# Patient Record
Sex: Male | Born: 1935
Health system: Southern US, Community
[De-identification: ages and names within clinical notes are randomized; demographics above are authoritative.]

## PROBLEM LIST (undated history)

## (undated) DIAGNOSIS — K219 Gastro-esophageal reflux disease without esophagitis: Secondary | ICD-10-CM

## (undated) DIAGNOSIS — E119 Type 2 diabetes mellitus without complications: Secondary | ICD-10-CM

## (undated) DIAGNOSIS — F0390 Unspecified dementia without behavioral disturbance: Secondary | ICD-10-CM

## (undated) DIAGNOSIS — I639 Cerebral infarction, unspecified: Secondary | ICD-10-CM

## (undated) DIAGNOSIS — I10 Essential (primary) hypertension: Secondary | ICD-10-CM

## (undated) DIAGNOSIS — I2510 Atherosclerotic heart disease of native coronary artery without angina pectoris: Secondary | ICD-10-CM

## (undated) DIAGNOSIS — IMO0001 Reserved for inherently not codable concepts without codable children: Secondary | ICD-10-CM

## (undated) DIAGNOSIS — C801 Malignant (primary) neoplasm, unspecified: Secondary | ICD-10-CM

## (undated) DIAGNOSIS — I509 Heart failure, unspecified: Secondary | ICD-10-CM

## (undated) DIAGNOSIS — H547 Unspecified visual loss: Secondary | ICD-10-CM

## (undated) DIAGNOSIS — N182 Chronic kidney disease, stage 2 (mild): Secondary | ICD-10-CM

## (undated) DIAGNOSIS — E7800 Pure hypercholesterolemia, unspecified: Secondary | ICD-10-CM

## (undated) DIAGNOSIS — M1990 Unspecified osteoarthritis, unspecified site: Secondary | ICD-10-CM

## (undated) DIAGNOSIS — R42 Dizziness and giddiness: Secondary | ICD-10-CM

## (undated) HISTORY — PX: CATARACT EXTRACTION: SUR2

## (undated) HISTORY — PX: CORONARY ANGIOPLASTY WITH STENT PLACEMENT: SHX49

## (undated) HISTORY — DX: Unspecified visual loss: H54.7

---

## 1996-03-04 DIAGNOSIS — C801 Malignant (primary) neoplasm, unspecified: Secondary | ICD-10-CM

## 1996-03-04 HISTORY — DX: Malignant (primary) neoplasm, unspecified: C80.1

## 1996-03-04 HISTORY — PX: PROSTATECTOMY: SHX69

## 1998-01-25 ENCOUNTER — Emergency Department (HOSPITAL_COMMUNITY): Admission: EM | Admit: 1998-01-25 | Discharge: 1998-01-25 | Payer: Self-pay | Admitting: Emergency Medicine

## 1999-08-09 ENCOUNTER — Emergency Department (HOSPITAL_COMMUNITY): Admission: EM | Admit: 1999-08-09 | Discharge: 1999-08-09 | Payer: Self-pay | Admitting: Emergency Medicine

## 1999-08-10 ENCOUNTER — Encounter: Payer: Self-pay | Admitting: Emergency Medicine

## 2000-03-19 ENCOUNTER — Emergency Department (HOSPITAL_COMMUNITY): Admission: EM | Admit: 2000-03-19 | Discharge: 2000-03-19 | Payer: Self-pay | Admitting: Emergency Medicine

## 2000-03-19 ENCOUNTER — Encounter: Payer: Self-pay | Admitting: Emergency Medicine

## 2000-04-01 ENCOUNTER — Encounter: Admission: RE | Admit: 2000-04-01 | Discharge: 2000-04-01 | Payer: Self-pay | Admitting: Family Medicine

## 2000-04-01 ENCOUNTER — Encounter: Payer: Self-pay | Admitting: Family Medicine

## 2001-11-09 ENCOUNTER — Emergency Department (HOSPITAL_COMMUNITY): Admission: EM | Admit: 2001-11-09 | Discharge: 2001-11-09 | Payer: Self-pay | Admitting: Emergency Medicine

## 2001-11-09 ENCOUNTER — Encounter: Payer: Self-pay | Admitting: Emergency Medicine

## 2002-04-23 ENCOUNTER — Encounter: Payer: Self-pay | Admitting: Family Medicine

## 2002-04-23 ENCOUNTER — Encounter: Admission: RE | Admit: 2002-04-23 | Discharge: 2002-04-23 | Payer: Self-pay | Admitting: Family Medicine

## 2003-08-07 ENCOUNTER — Emergency Department (HOSPITAL_COMMUNITY): Admission: EM | Admit: 2003-08-07 | Discharge: 2003-08-07 | Payer: Self-pay | Admitting: Emergency Medicine

## 2004-05-11 ENCOUNTER — Emergency Department (HOSPITAL_COMMUNITY): Admission: EM | Admit: 2004-05-11 | Discharge: 2004-05-11 | Payer: Self-pay

## 2004-08-07 ENCOUNTER — Ambulatory Visit: Payer: Self-pay | Admitting: Internal Medicine

## 2004-08-07 ENCOUNTER — Inpatient Hospital Stay (HOSPITAL_COMMUNITY): Admission: EM | Admit: 2004-08-07 | Discharge: 2004-08-09 | Payer: Self-pay | Admitting: *Deleted

## 2004-08-07 ENCOUNTER — Encounter (INDEPENDENT_AMBULATORY_CARE_PROVIDER_SITE_OTHER): Payer: Self-pay | Admitting: Cardiology

## 2004-08-16 ENCOUNTER — Ambulatory Visit (HOSPITAL_COMMUNITY): Admission: RE | Admit: 2004-08-16 | Discharge: 2004-08-16 | Payer: Self-pay | Admitting: Internal Medicine

## 2004-08-16 ENCOUNTER — Ambulatory Visit: Payer: Self-pay | Admitting: Internal Medicine

## 2004-08-22 ENCOUNTER — Emergency Department (HOSPITAL_COMMUNITY): Admission: EM | Admit: 2004-08-22 | Discharge: 2004-08-23 | Payer: Self-pay | Admitting: Emergency Medicine

## 2004-09-12 ENCOUNTER — Ambulatory Visit: Payer: Self-pay | Admitting: Pulmonary Disease

## 2004-10-19 ENCOUNTER — Ambulatory Visit (HOSPITAL_BASED_OUTPATIENT_CLINIC_OR_DEPARTMENT_OTHER): Admission: RE | Admit: 2004-10-19 | Discharge: 2004-10-19 | Payer: Self-pay | Admitting: Pulmonary Disease

## 2004-10-25 ENCOUNTER — Ambulatory Visit: Payer: Self-pay | Admitting: Pulmonary Disease

## 2004-11-13 ENCOUNTER — Ambulatory Visit: Payer: Self-pay | Admitting: Pulmonary Disease

## 2005-05-03 ENCOUNTER — Ambulatory Visit: Payer: Self-pay | Admitting: Gastroenterology

## 2005-05-13 ENCOUNTER — Ambulatory Visit: Payer: Self-pay | Admitting: Gastroenterology

## 2005-05-13 ENCOUNTER — Encounter (INDEPENDENT_AMBULATORY_CARE_PROVIDER_SITE_OTHER): Payer: Self-pay | Admitting: Specialist

## 2006-03-27 ENCOUNTER — Emergency Department (HOSPITAL_COMMUNITY): Admission: EM | Admit: 2006-03-27 | Discharge: 2006-03-27 | Payer: Self-pay | Admitting: Emergency Medicine

## 2006-05-09 ENCOUNTER — Ambulatory Visit: Payer: Self-pay | Admitting: Gastroenterology

## 2006-05-28 ENCOUNTER — Encounter: Payer: Self-pay | Admitting: Gastroenterology

## 2006-05-28 ENCOUNTER — Ambulatory Visit: Payer: Self-pay | Admitting: Gastroenterology

## 2006-12-19 ENCOUNTER — Ambulatory Visit: Admission: RE | Admit: 2006-12-19 | Discharge: 2007-03-04 | Payer: Self-pay | Admitting: Radiation Oncology

## 2007-03-05 ENCOUNTER — Ambulatory Visit: Admission: RE | Admit: 2007-03-05 | Discharge: 2007-05-08 | Payer: Self-pay | Admitting: Radiation Oncology

## 2007-07-10 ENCOUNTER — Ambulatory Visit: Payer: Self-pay | Admitting: Gastroenterology

## 2007-07-24 ENCOUNTER — Ambulatory Visit: Payer: Self-pay | Admitting: Gastroenterology

## 2007-07-24 ENCOUNTER — Encounter: Payer: Self-pay | Admitting: Gastroenterology

## 2007-07-29 ENCOUNTER — Encounter: Payer: Self-pay | Admitting: Gastroenterology

## 2007-08-08 ENCOUNTER — Emergency Department (HOSPITAL_COMMUNITY): Admission: EM | Admit: 2007-08-08 | Discharge: 2007-08-08 | Payer: Self-pay | Admitting: Emergency Medicine

## 2007-08-18 ENCOUNTER — Encounter: Admission: RE | Admit: 2007-08-18 | Discharge: 2007-08-18 | Payer: Self-pay | Admitting: Family Medicine

## 2008-04-01 ENCOUNTER — Encounter: Admission: RE | Admit: 2008-04-01 | Discharge: 2008-04-01 | Payer: Self-pay | Admitting: Family Medicine

## 2008-04-15 ENCOUNTER — Encounter: Admission: RE | Admit: 2008-04-15 | Discharge: 2008-04-15 | Payer: Self-pay | Admitting: Family Medicine

## 2008-07-22 ENCOUNTER — Emergency Department (HOSPITAL_COMMUNITY): Admission: EM | Admit: 2008-07-22 | Discharge: 2008-07-22 | Payer: Self-pay | Admitting: Emergency Medicine

## 2008-09-23 ENCOUNTER — Ambulatory Visit (HOSPITAL_BASED_OUTPATIENT_CLINIC_OR_DEPARTMENT_OTHER): Admission: RE | Admit: 2008-09-23 | Discharge: 2008-09-23 | Payer: Self-pay | Admitting: Urology

## 2008-09-23 ENCOUNTER — Encounter (INDEPENDENT_AMBULATORY_CARE_PROVIDER_SITE_OTHER): Payer: Self-pay | Admitting: Urology

## 2009-03-10 ENCOUNTER — Encounter: Admission: RE | Admit: 2009-03-10 | Discharge: 2009-03-10 | Payer: Self-pay | Admitting: Family Medicine

## 2010-02-02 ENCOUNTER — Emergency Department (HOSPITAL_COMMUNITY)
Admission: EM | Admit: 2010-02-02 | Discharge: 2010-02-02 | Payer: Self-pay | Source: Home / Self Care | Admitting: Emergency Medicine

## 2010-03-04 HISTORY — PX: OTHER SURGICAL HISTORY: SHX169

## 2010-05-14 LAB — CBC
HCT: 38.5 % — ABNORMAL LOW (ref 39.0–52.0)
MCH: 29.5 pg (ref 26.0–34.0)
MCV: 84.2 fL (ref 78.0–100.0)
WBC: 6 10*3/uL (ref 4.0–10.5)

## 2010-05-14 LAB — DIFFERENTIAL
Eosinophils Absolute: 0.1 10*3/uL (ref 0.0–0.7)
Eosinophils Relative: 2 % (ref 0–5)
Lymphs Abs: 2.6 10*3/uL (ref 0.7–4.0)
Monocytes Absolute: 0.7 10*3/uL (ref 0.1–1.0)
Monocytes Relative: 11 % (ref 3–12)
Neutrophils Relative %: 43 % (ref 43–77)

## 2010-05-14 LAB — BASIC METABOLIC PANEL
BUN: 22 mg/dL (ref 6–23)
Calcium: 9.2 mg/dL (ref 8.4–10.5)
Creatinine, Ser: 1.7 mg/dL — ABNORMAL HIGH (ref 0.4–1.5)
GFR calc non Af Amer: 39 mL/min — ABNORMAL LOW (ref >60)

## 2010-05-14 LAB — POCT CARDIAC MARKERS: Myoglobin, poc: 91.7 ng/mL (ref 12–200)

## 2010-06-10 LAB — POCT I-STAT 4, (NA,K, GLUC, HGB,HCT)
Glucose, Bld: 108 mg/dL — ABNORMAL HIGH (ref 70–99)
Hemoglobin: 15 g/dL (ref 13.0–17.0)
Potassium: 3.9 mEq/L (ref 3.5–5.1)
Sodium: 139 mEq/L (ref 135–145)

## 2010-06-12 LAB — DIFFERENTIAL
Basophils Absolute: 0 K/uL (ref 0.0–0.1)
Basophils Relative: 1 % (ref 0–1)
Eosinophils Absolute: 0.1 K/uL (ref 0.0–0.7)
Eosinophils Relative: 2 % (ref 0–5)
Lymphocytes Relative: 28 % (ref 12–46)
Lymphs Abs: 1.2 K/uL (ref 0.7–4.0)
Monocytes Absolute: 0.6 K/uL (ref 0.1–1.0)
Monocytes Relative: 14 % — ABNORMAL HIGH (ref 3–12)
Neutro Abs: 2.5 K/uL (ref 1.7–7.7)
Neutrophils Relative %: 56 % (ref 43–77)

## 2010-06-12 LAB — BASIC METABOLIC PANEL
BUN: 11 mg/dL (ref 6–23)
Chloride: 106 mEq/L (ref 96–112)
Creatinine, Ser: 1.12 mg/dL (ref 0.4–1.5)

## 2010-06-12 LAB — CBC
Hemoglobin: 12.8 g/dL — ABNORMAL LOW (ref 13.0–17.0)
MCHC: 33.6 g/dL (ref 30.0–36.0)
MCV: 86.7 fL (ref 78.0–100.0)
RBC: 4.38 MIL/uL (ref 4.22–5.81)
WBC: 4.4 10*3/uL (ref 4.0–10.5)

## 2010-07-17 NOTE — Op Note (Signed)
NAMEMATE, ALEGRIA                  ACCOUNT NO.:  [id number]   MEDICAL RECORD NO.:  [id number]          PATIENT TYPE:  AMB   LOCATION:  [facility]                         FACILITY:  [facility]   PHYSICIAN:  Lindaann Slough, M.D.  DATE OF BIRTH:  10-Nov-1929   DATE OF PROCEDURE:  09/23/2008  DATE OF DISCHARGE:                               OPERATIVE REPORT   PREOPERATIVE DIAGNOSIS:  Microhematuria.   POSTOPERATIVE DIAGNOSIS:  Microhematuria.   PROCEDURE DONE:  Cystoscopy and bladder biopsy.   SURGEON:  Dr. Brunilda Payor.   ANESTHESIA:  General.   INDICATION:  The patient is a 77 year old male who was found on  urinalysis to have microhematuria.  He had IMRT for prostate cancer in  2009.  CT scan showed normal kidneys.  Cystoscopy in the office showed a  reddened area on the right side of the bladder.  He is scheduled for  cystoscopy to rule out carcinoma in situ of the bladder.   The patient was identified by his wrist band and proper timeout was  taken.   Under general anesthesia, he was prepped and draped and placed in the  dorsal lithotomy position.  A panendoscope was inserted in the bladder.  The urethra is normal.  He has trilobar prostatic hypertrophy.  The  bladder mucosa is reddened and there is no stone or tumor in the  bladder.  The ureteral orifices are in normal position and shape with  clear efflux.  The bladder is trabeculated.  Then the reddened areas of  the bladder were biopsied with the cold cup forceps.  Then random  bladder biopsy of the bladder was done.  Then areas of biopsy was then  fulgurated with the Bugbee electrode.  There was no evidence of bleeding  at the end of the procedure.  The cystoscope was removed.   He tolerated the procedure well.      Lindaann Slough, M.D.  Electronically Signed     MN/MEDQ  D:  09/23/2008  T:  09/23/2008  Job:  [id number]

## 2010-07-20 NOTE — Procedures (Signed)
NAMEGRAYTON, Trevor Andrews NO.:  [id number]   MEDICAL RECORD NO.:  [id number]          PATIENT TYPE:  OUT   LOCATION:  SLEEP CENTER                 FACILITY:  [facility]   PHYSICIAN:  Marcelyn Bruins, M.D. [facility] DATE OF BIRTH:  [birth year]   DATE OF STUDY:  10/19/2004                              NOCTURNAL POLYSOMNOGRAM   REFERRING PHYSICIAN:  Marcelyn Bruins, MD.   DATE OF STUDY:  October 19, 2004.   INDICATION FOR STUDY:  Hypersomnia with sleep apnea.  Epworth sleepiness  score: 12.   SLEEP ARCHITECTURE:  The patient had a total sleep time of 193 minutes with  very little REM and never achieved slow wave sleep. Sleep onset latency was  normal and REM onset was prolonged. Sleep efficiency was only 52%.   IMPRESSION:  1.  Small numbers of obstructive hypopneas which do not meet the RDI      criteria for the obstructive sleep apnea syndrome.  2.  Mild snoring noted throughout the study with nothing to suggest      increased upper airway resistance.  3.  Occasional PVC.  4.  Large numbers of leg jerks with significant sleep disruption. I suspect      this is the etiology of the patient's sleep disruption and daytime      sleepiness.           ______________________________  Marcelyn Bruins, M.D. [facility]  Diplomate, American Board of Sleep  Medicine     KC/MEDQ  D:  10/24/2004 14:52:00  T:  10/24/2004 22:02:55  Job:  [id number]

## 2010-07-20 NOTE — Discharge Summary (Signed)
NAMEADRIANA, LINA NO.:  [id number]   MEDICAL RECORD NO.:  [id number]          PATIENT TYPE:  INP   LOCATION:  3733                         FACILITY:  [facility]   PHYSICIAN:  Vanetta Mulders, MD         DATE OF BIRTH:  May 11, 1929   DATE OF ADMISSION:  08/07/2004  DATE OF DISCHARGE:  08/09/2004                                 DISCHARGE SUMMARY   DISCHARGE DIAGNOSES:  1.  Hypertension.  2.  Hyperlipidemia.  3.  Bradycardia.  4.  Obstructive sleep apnea with question.  5.  Right bundle branch block on EKG.  Unknown onset.   DISCHARGE MEDICATIONS:  1.  Aspirin 325 mg p.o. daily.  2.  Protonix 40 mg p.o. daily.  3.  Crestor 20 mg p.o. daily.  4.  Nitroglycerin 0.4 mg sublingual every five minutes x3 p.r.n. for chest      pain.   HISTORY OF PRESENT ILLNESS:  Mr. Kosmicki is a 77 year old African-American man  looking a lot younger than his age with no known history of coronary artery  disease.  He presented to the emergency room at [facility] in the  a.m. of admission because of chest tightness on the left side of his chest  with no radiation for a matter of minutes.  He denied any dyspnea or  diaphoresis.  He denied  nausea or vomiting.  He denied history of diabetes  or history of smoking.   ALLERGIES:  Unknown.   PAST MEDICAL HISTORY:  Significant for degenerative spine disease, also  arthritis of the knees, hypercholesterolemia, hypertension.  He has never  smoked, and he drinks alcohol occasionally one time a week.  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 He has Medicare.  Lives with his wife.   FAMILY HISTORY:  Positive for one of the siblings that died 20 years ago  from myocardial infarction at an unknown age, and one sister died of cancer.   PHYSICAL EXAMINATION:  VITAL SIGNS:  Heart rate 58,  blood pressure 175/88,  temperature 97.4, respiratory rate 20, O2 saturation 99% on room air.  GENERAL:  The patient was in no acute distress.  HEENT:  Eyes:  PERRLA.  Extraocular movements intact.  ENT:  Clear.  NECK:  No jugular venous distention.  No thyromegaly.  RESPIRATORY:  Clear to auscultation bilaterally.  CARDIOVASCULAR:  Regular rate and rhythm.  Bradycardia.  No murmurs.  No  rubs.  No gallops.  GI:  Soft, nontender, nondistended.  Bowel sounds positive.  EXTREMITIES:  No edema. Pulses positive.  SKIN:  Warm and dry.  MUSCULOSKELETAL:  Intact.  NEUROLOGIC:  Nonfocal.  PSYCHIATRIC:  Oriented x3.   LABS AT ADMISSION:  Cardiac markers point of care negative x3.  Sodium 138,  potassium 3.6, chloride 107, bicarb 24, BUN 14, creatinine 1.1, glucose 101,  hemoglobin 13.1, white blood cells 5 with an ANC of 2.3, and platelets 229,  bilirubin 0.7, alkaline phosphatase 85, SGOT 17, SGPT 17, protein 6.5,  albumin 3.2, calcium 8.7, lipase 34, D-dimer 0.41.  Chest x-ray portable  showed left lower lobe atelectasis versus infiltrate.  Repeat PA and lateral  show no acute disease.  EKG showed sinus bradycardia with right bundle  branch block and also first-degree AV block and question of inferolateral  ischemia.   PROBLEM:  1.  Chest pain, rule out myocardial infarction.  Risk factors for coronary      artery disease are hyperlipidemia, hypertension, and some remote family      history.  Also, age and gender.  The patient is well fit.  He does not      have obesity, and he has never smoked.  We checked cardiac enzymes q.8h      x3 and they were consistently negative.  We also checked EKGs at several      hour time interval and they were unchanged.  D-dimer was negative.  We      started patient on nitroglycerin drip the second day of admission.  He      was pain free.  The nitroglycerin drip was stopped.  The patient also      was started on aspirin and he continued statins throughout      hospitalization.  He was not a candidate for beta blockers because of      bradycardia.  We consulted cardiology [facility] and we also  ordered a      Cardiolite Persantine which was negative for ischemia, and it showed a      normal ejection fraction of 57%.  We also checked a 2-D echo during this      hospitalization which showed normal ejection fraction, right ventricle      was poorly observed because of poor technique.  There was a question of      septal desynchronism versus right ventricle enlargement and pulmonary      hypertension.  The patient was discharged home.  He is supposed to      follow up with Dr. Frederico Hamman at [facility] and with his primary      care physician for further assessment and plan.  2.  Bradycardia with first-degree block was stable throughout      hospitalization.  The patient was monitored on telemetry.  No events      were noted.  3.  Hyperlipidemia.  We checked a fasting lipid panel which showed a total      cholesterol of 154, LDL 90, HDL 40, triglycerides 119.  The patient was      advised to continue Crestor 20 mg at home as prior to hospitalization.  4.  Gastroesophageal reflux disease with question.  The patient says that      sometimes he has acid reflux.  We started him on Protonix and he was      discharged on the same medication.  5.  Obstructive sleep apnea with question.  The patient complained of      daytime somnolence, fatigue, and lethargy.  He says he cannot go through      an entire day without a daytime nap.  He also complained of superficial      sleep with multiple awakenings through the night, and he also states      that he is heavily snoring according to his wife.  At this point, we      highly consider study for sleep apnea as part of his problems, so we      advised him to follow up with Dr. Shelle Iron at  [facility] on July      12 at 9 a.m.  He would need to have a formal sleep study to confirm our      suspicion.  6.  Prophylaxis.  Throughout hospitalization, the patient was placed on     Lovenox and labs at discharge showed a sodium of 139, potassium  3.7,      chloride 105, bicarb 24, BUN 10, creatinine 1.2, and glucose 102,      hemoglobin 13.5, white blood cell 5.4, and platelets 221.       DA/MEDQ  D:  08/09/2004  T:  08/09/2004  Job:  [id number]

## 2010-11-29 LAB — COMPREHENSIVE METABOLIC PANEL
AST: 21
BUN: 13
CO2: 25
Calcium: 9
Chloride: 102
Creatinine, Ser: 1.2
GFR calc Af Amer: 53 — ABNORMAL LOW
GFR calc non Af Amer: 44 — ABNORMAL LOW
Glucose, Bld: 117 — ABNORMAL HIGH
Total Bilirubin: 0.8

## 2010-11-29 LAB — COMPREHENSIVE METABOLIC PANEL WITH GFR
ALT: 16
Albumin: 3.6
Alkaline Phosphatase: 87
Potassium: 3.5
Sodium: 136
Total Protein: 7.3

## 2010-11-29 LAB — DIFFERENTIAL
Band Neutrophils: 0
Basophils Absolute: 0
Basophils Relative: 0
Blasts: 0
Eosinophils Absolute: 0
Eosinophils Relative: 0
Lymphocytes Relative: 4 — ABNORMAL LOW
Lymphs Abs: 0.4 — ABNORMAL LOW
Metamyelocytes Relative: 0
Monocytes Absolute: 1.1 — ABNORMAL HIGH
Monocytes Relative: 10
Myelocytes: 0
Neutro Abs: 9.2 — ABNORMAL HIGH
Neutrophils Relative %: 86 — ABNORMAL HIGH
Promyelocytes Absolute: 0
nRBC: 0

## 2010-11-29 LAB — URINALYSIS, ROUTINE W REFLEX MICROSCOPIC
Bilirubin Urine: NEGATIVE
Glucose, UA: NEGATIVE
Hgb urine dipstick: NEGATIVE
Specific Gravity, Urine: 1.015
Urobilinogen, UA: 1

## 2010-11-29 LAB — CBC
HCT: 40.3
Hemoglobin: 13.6
MCHC: 33.9
MCV: 87.3
Platelets: 187
RBC: 4.61
RDW: 14.2
WBC: 10.7 — ABNORMAL HIGH

## 2010-11-29 LAB — LIPASE, BLOOD: Lipase: 26

## 2011-01-31 ENCOUNTER — Emergency Department (HOSPITAL_COMMUNITY)
Admission: EM | Admit: 2011-01-31 | Discharge: 2011-01-31 | Disposition: A | Discharge status: Home / Self Care | Payer: Medicare Other | Attending: Emergency Medicine | Admitting: Emergency Medicine

## 2011-01-31 ENCOUNTER — Emergency Department (HOSPITAL_COMMUNITY): Payer: Medicare Other

## 2011-01-31 ENCOUNTER — Encounter: Payer: Self-pay | Admitting: Nurse Practitioner

## 2011-01-31 DIAGNOSIS — R6889 Other general symptoms and signs: Secondary | ICD-10-CM | POA: Insufficient documentation

## 2011-01-31 DIAGNOSIS — J209 Acute bronchitis, unspecified: Secondary | ICD-10-CM | POA: Insufficient documentation

## 2011-01-31 DIAGNOSIS — R110 Nausea: Secondary | ICD-10-CM | POA: Insufficient documentation

## 2011-01-31 DIAGNOSIS — I10 Essential (primary) hypertension: Secondary | ICD-10-CM | POA: Insufficient documentation

## 2011-01-31 DIAGNOSIS — R05 Cough: Secondary | ICD-10-CM | POA: Insufficient documentation

## 2011-01-31 DIAGNOSIS — E785 Hyperlipidemia, unspecified: Secondary | ICD-10-CM | POA: Insufficient documentation

## 2011-01-31 DIAGNOSIS — R059 Cough, unspecified: Secondary | ICD-10-CM | POA: Insufficient documentation

## 2011-01-31 DIAGNOSIS — E7800 Pure hypercholesterolemia, unspecified: Secondary | ICD-10-CM | POA: Insufficient documentation

## 2011-01-31 HISTORY — DX: Unspecified osteoarthritis, unspecified site: M19.90

## 2011-01-31 HISTORY — DX: Essential (primary) hypertension: I10

## 2011-01-31 HISTORY — DX: Pure hypercholesterolemia, unspecified: E78.00

## 2011-01-31 LAB — DIFFERENTIAL
Basophils Relative: 0 % (ref 0–1)
Eosinophils Relative: 2 % (ref 0–5)
Lymphocytes Relative: 11 % — ABNORMAL LOW (ref 12–46)
Monocytes Absolute: 1.2 10*3/uL — ABNORMAL HIGH (ref 0.1–1.0)
Monocytes Relative: 20 % — ABNORMAL HIGH (ref 3–12)
Neutro Abs: 3.9 10*3/uL (ref 1.7–7.7)

## 2011-01-31 LAB — COMPREHENSIVE METABOLIC PANEL
BUN: 17 mg/dL (ref 6–23)
CO2: 30 mEq/L (ref 19–32)
Calcium: 9.2 mg/dL (ref 8.4–10.5)
Chloride: 95 mEq/L — ABNORMAL LOW (ref 96–112)
Creatinine, Ser: 1.47 mg/dL — ABNORMAL HIGH (ref 0.50–1.35)
GFR calc non Af Amer: 44 mL/min — ABNORMAL LOW (ref >90)
Total Bilirubin: 0.4 mg/dL (ref 0.3–1.2)

## 2011-01-31 LAB — TROPONIN I: Troponin I: <0.3 ng/mL (ref <0.30)

## 2011-01-31 LAB — CBC
HCT: 38.1 % — ABNORMAL LOW (ref 39.0–52.0)
Hemoglobin: 12.8 g/dL — ABNORMAL LOW (ref 13.0–17.0)
MCHC: 33.6 g/dL (ref 30.0–36.0)
MCV: 85.4 fL (ref 78.0–100.0)

## 2011-01-31 MED ORDER — ONDANSETRON 4 MG PO TBDP: 4.0000 mg | ORAL_TABLET | Freq: Once | ORAL | Status: DC

## 2011-01-31 MED ORDER — ONDANSETRON HCL 4 MG PO TABS: 4.0000 mg | ORAL_TABLET | Freq: Four times a day (QID) | ORAL | Status: DC

## 2011-01-31 MED ORDER — ONDANSETRON 4 MG PO TBDP
4.0000 mg | ORAL_TABLET | Freq: Once | ORAL | Status: AC
  Administered 2011-01-31: 4 mg via ORAL
  Filled 2011-01-31: qty 1

## 2011-01-31 MED ORDER — AZITHROMYCIN 250 MG PO TABS: 250.0000 mg | ORAL_TABLET | Freq: Every day | ORAL | Status: AC

## 2011-01-31 MED ORDER — AZITHROMYCIN 250 MG PO TABS
500.0000 mg | ORAL_TABLET | Freq: Once | ORAL | Status: AC
  Administered 2011-01-31: 500 mg via ORAL
  Filled 2011-01-31: qty 2

## 2011-01-31 NOTE — ED Notes (Signed)
C/o nasuea, poor appetite, productive cough, scratchy throat x 2 weeks. A&Ox4, resp e/u

## 2011-01-31 NOTE — ED Provider Notes (Signed)
History     CSN: [id number] Arrival date & time: 01/31/2011 12:11 PM   First MD Initiated Contact with Patient 01/31/11 1605      Chief Complaint  Patient presents with  . Nausea    (Consider location/radiation/quality/duration/timing/severity/associated sxs/prior treatment) The history is provided by the patient and the spouse.   patient is a 77 year old male with a history of hypertension hyperlipidemia and remote prostate cancer who presents with 2 weeks of scratchy throat, dry cough, and mild nausea. His throat is generally scratchy but he is able to tolerate by mouth and he does not note lateralization of symptoms. He has not had any productive cough or fevers. He has had generalized nausea which typically improves when he eats something but he has decreased appetite. He has not had any vomiting or diarrhea. No abdominal pain. No dysuria or flank pain. He has overall had a decrease in his activity level, but this has been going on for about one year (no acute change). He has not had any headaches or difficulty ambulating. He does have history of vertigo but this has been well-controlled lately. Overall severity is described as mild to moderate. Patient denies any chest pain or change in the symptoms with exertion.  No recent travel and no known sick contacts.  Past Medical History  Diagnosis Date  . Hypertension   . Hypercholesteremia   . Arthritis     Past Surgical History  Procedure Date  . Prostatectomy     History reviewed. No pertinent family history.  History  Substance Use Topics  . Smoking status: Never Smoker   . Smokeless tobacco: Not on file  . Alcohol Use:      rare      Review of Systems  Constitutional: Negative for fever, chills and activity change.  HENT: Negative for congestion and neck pain.   Respiratory: Negative for cough, chest tightness, shortness of breath and wheezing.   Cardiovascular: Negative for chest pain.  Gastrointestinal: Positive  for nausea. Negative for vomiting, abdominal pain, diarrhea and abdominal distention.  Genitourinary: Negative for difficulty urinating.  Musculoskeletal: Negative for gait problem.  Skin: Negative for rash.  Neurological: Negative for weakness and numbness.  Psychiatric/Behavioral: Negative for behavioral problems and confusion.  All other systems reviewed and are negative.    Allergies  Review of patient's allergies indicates no known allergies.  Home Medications   Current Outpatient Rx  Name Route Sig Dispense Refill  . AMLODIPINE BESYLATE 10 MG PO TABS Oral Take 5 mg by mouth daily.      . CHLORTHALIDONE 25 MG PO TABS Oral Take 12.5 mg by mouth daily.      Marland Kitchen VITAMIN D 2000 UNITS PO CAPS Oral Take 1 capsule by mouth daily.      Marland Kitchen SIMVASTATIN 40 MG PO TABS Oral Take 20 mg by mouth at bedtime.      . TERAZOSIN HCL 5 MG PO CAPS Oral Take 5 mg by mouth at bedtime.      . AZITHROMYCIN 250 MG PO TABS Oral Take 1 tablet (250 mg total) by mouth daily. 4 each 0  . ONDANSETRON HCL 4 MG PO TABS Oral Take 1 tablet (4 mg total) by mouth every 6 (six) hours. 20 tablet 0  . ONDANSETRON 4 MG PO TBDP Oral Take 1 tablet (4 mg total) by mouth once. 20 tablet 0    BP 129/66  Pulse 74  Temp(Src) 99.9 F (37.7 C) (Oral)  Resp 18  Ht 6\' 2"  (  1.88 m)  Wt 230 lb (104.327 kg)  BMI 29.53 kg/m2  SpO2 96%  Physical Exam  Nursing note and vitals reviewed. Constitutional: He is oriented to person, place, and time. He appears well-developed and well-nourished. No distress.  HENT:  Head: Normocephalic.  Nose: Nose normal.  Eyes: EOM are normal. Pupils are equal, round, and reactive to light. No scleral icterus.  Neck: Normal range of motion. Neck supple. No JVD present.  Cardiovascular: Normal rate, regular rhythm and intact distal pulses.   No murmur heard. Pulmonary/Chest: Effort normal and breath sounds normal. No respiratory distress. He exhibits no tenderness.  Abdominal: Soft. Bowel sounds are  normal. He exhibits no distension. There is no tenderness.  Musculoskeletal: Normal range of motion. He exhibits no edema and no tenderness.       No calf ttp No LE edema  Neurological: He is alert and oriented to person, place, and time.       Normal strength  Skin: Skin is warm and dry. No rash noted. He is not diaphoretic.  Psychiatric: He has a normal mood and affect. His behavior is normal. Thought content normal.    ED Course  Procedures (including critical care time)  Labs Reviewed  CBC - Abnormal; Notable for the following:    Hemoglobin 12.8 (*)    HCT 38.1 (*)    All other components within normal limits  DIFFERENTIAL - Abnormal; Notable for the following:    Lymphocytes Relative 11 (*)    Monocytes Relative 20 (*)    Monocytes Absolute 1.2 (*)    All other components within normal limits  COMPREHENSIVE METABOLIC PANEL - Abnormal; Notable for the following:    Potassium 3.4 (*)    Chloride 95 (*)    Creatinine, Ser 1.47 (*)    GFR calc non Af Amer 44 (*)    GFR calc Af Amer 51 (*)    All other components within normal limits  TROPONIN I   Dg Chest 2 View  01/31/2011  *RADIOLOGY REPORT*  Clinical Data: Cough, sore throat  CHEST - 2 VIEW  Comparison: 02/02/2010  Findings: Enlargement of cardiac silhouette. Tortuous aorta. Pulmonary vascularity normal. Minimal bronchitic changes and left basilar atelectasis. Lungs otherwise clear. No pleural effusion or pneumothorax. Multilevel endplate spur formation thoracic spine.  IMPRESSION: Enlargement of cardiac silhouette. Bronchitic changes with left basilar atelectasis.  Original Report Authenticated By: Lollie Marrow, M.D.    Date: 01/31/2011  Rate: 60  Rhythm: normal sinus rhythm  QRS Axis: left  Intervals: normal  ST/T Wave abnormalities: non specific TWI in inferior and precordial leads  Conduction Disutrbances:right bundle branch block  Narrative Interpretation:   Old EKG Reviewed: previous EKG with non specific TWI  and biphasic T waves in inferior and precordial leads;  Mildly different morphology on ecg today.    1. Bronchitis, acute       MDM   Clinical picture is consistent with acute bronchitis. Based on the duration and severity of symptom, will cover with azithromycin.  Chest x-ray is negative for an acute pneumonia. Patient does note additional generalized symptoms. His nausea is mild in nature and not with any associated abdominal pain. He has also been able to keep down food and drink but has decreased appetite. Patient denies chest pain or dyspnea or exertional symptoms. His EKG today shows mild nonspecific T-wave inversions in inferior and precordial leads. These are subtle changes compared to previous EKG. The right bundle branch block is old.  Although  these T-wave inversions are different morphology and nature than previous nonspecific changes, patient has no complaint of chest pain/sob or exertional symptoms.  Symptoms have also been constant for a couple weeks and his troponin is negative. There is no suggestion of acute coronary syndrome.  Provide a copy of EKG with patient and will have him closely followup with his PCP at the [place].  Close return precautions discussed.        Milus Glazier 02/01/11 [phone]

## 2011-01-31 NOTE — ED Notes (Signed)
Old and new EKG given to Dr. Rubin Payor.  Copies placed in chart.

## 2011-01-31 NOTE — ED Notes (Signed)
No signs of distress; ambulated with a steady gait; VSS; reported he will follow d/c instructions.

## 2011-02-02 NOTE — ED Provider Notes (Signed)
I saw and evaluated the patient, reviewed the resident's note and I agree with the findings and plan and agree with their ECG interpretation. Bronchitis with small ECG changes. D/w Cardiology. OK for discharge   Juliet Rude. Rubin Payor, MD 02/02/11 0930

## 2011-02-05 ENCOUNTER — Encounter (HOSPITAL_COMMUNITY): Payer: Self-pay | Admitting: *Deleted

## 2011-02-05 ENCOUNTER — Other Ambulatory Visit: Payer: Self-pay

## 2011-02-05 ENCOUNTER — Emergency Department (HOSPITAL_COMMUNITY)
Admission: EM | Admit: 2011-02-05 | Discharge: 2011-02-06 | Disposition: A | Discharge status: Home / Self Care | Payer: Medicare Other | Attending: Emergency Medicine | Admitting: Emergency Medicine

## 2011-02-05 ENCOUNTER — Emergency Department (HOSPITAL_COMMUNITY): Payer: Medicare Other

## 2011-02-05 DIAGNOSIS — T363X4A Poisoning by macrolides, undetermined, initial encounter: Secondary | ICD-10-CM | POA: Insufficient documentation

## 2011-02-05 DIAGNOSIS — Z79899 Other long term (current) drug therapy: Secondary | ICD-10-CM | POA: Insufficient documentation

## 2011-02-05 DIAGNOSIS — F068 Other specified mental disorders due to known physiological condition: Secondary | ICD-10-CM | POA: Insufficient documentation

## 2011-02-05 DIAGNOSIS — I10 Essential (primary) hypertension: Secondary | ICD-10-CM | POA: Insufficient documentation

## 2011-02-05 DIAGNOSIS — Z09 Encounter for follow-up examination after completed treatment for conditions other than malignant neoplasm: Secondary | ICD-10-CM | POA: Insufficient documentation

## 2011-02-05 DIAGNOSIS — E7800 Pure hypercholesterolemia, unspecified: Secondary | ICD-10-CM | POA: Insufficient documentation

## 2011-02-05 DIAGNOSIS — M129 Arthropathy, unspecified: Secondary | ICD-10-CM | POA: Insufficient documentation

## 2011-02-05 DIAGNOSIS — T3691XA Poisoning by unspecified systemic antibiotic, accidental (unintentional), initial encounter: Secondary | ICD-10-CM | POA: Insufficient documentation

## 2011-02-05 DIAGNOSIS — R350 Frequency of micturition: Secondary | ICD-10-CM | POA: Insufficient documentation

## 2011-02-05 DIAGNOSIS — T50901A Poisoning by unspecified drugs, medicaments and biological substances, accidental (unintentional), initial encounter: Secondary | ICD-10-CM

## 2011-02-05 HISTORY — DX: Unspecified dementia, unspecified severity, without behavioral disturbance, psychotic disturbance, mood disturbance, and anxiety: F03.90

## 2011-02-05 LAB — POCT I-STAT, CHEM 8
BUN: 21 mg/dL (ref 6–23)
Calcium, Ion: 1.13 mmol/L (ref 1.12–1.32)
Chloride: 99 meq/L (ref 96–112)
Creatinine, Ser: 1.8 mg/dL — ABNORMAL HIGH (ref 0.50–1.35)
Glucose, Bld: 90 mg/dL (ref 70–99)
HCT: 39 % (ref 39.0–52.0)
Hemoglobin: 13.3 g/dL (ref 13.0–17.0)
Potassium: 3.6 meq/L (ref 3.5–5.1)
Sodium: 139 meq/L (ref 135–145)
TCO2: 30 mmol/L (ref 0–100)

## 2011-02-05 LAB — URINALYSIS, ROUTINE W REFLEX MICROSCOPIC
Bilirubin Urine: NEGATIVE
Glucose, UA: NEGATIVE mg/dL
Hgb urine dipstick: NEGATIVE
Ketones, ur: NEGATIVE mg/dL
Leukocytes, UA: NEGATIVE
Protein, ur: NEGATIVE mg/dL
pH: 6.5 (ref 5.0–8.0)

## 2011-02-05 LAB — POCT I-STAT TROPONIN I: Troponin i, poc: 0.01 ng/mL (ref 0.00–0.08)

## 2011-02-05 NOTE — ED Notes (Signed)
Called poison control and advised them of pt medication ingestion.

## 2011-02-05 NOTE — ED Notes (Signed)
Poison control states that since pt is feeling fine no further action is needed.  If EDP would like a BUN and Creatnine may be checked

## 2011-02-05 NOTE — ED Provider Notes (Signed)
History     CSN: [id number] Arrival date & time: 02/05/2011  4:58 PM   First MD Initiated Contact with Patient 02/05/11 2003      Chief Complaint  Patient presents with  . Follow-up    (Consider location/radiation/quality/duration/timing/severity/associated sxs/prior treatment) HPI Comments: [name] was seen 5 days ago, diagnosed with bronchitis and given a prescription for azithromycin for a five-day course.  He misunderstood the instructions and took all of the tablets on Friday.  Today he relies his mistake called his physician, Dr. Clyda Greener, who told him to come to the emergency room for evaluation.  He also reports that, since she's run out of his chair.  His 075 mg, tablets he's been trying to take 22 mg tablets, but finds that he is urinating more frequently and is concerned that he may have a urinary tract infection, although he has no pain.  Has had no fever.  He states he has reordered the 5 mg dose from the [place] and is awaiting delivery.  As far as his bronchitis, says he's feeling much better, denies cough, fever, chest pain, shortness of breath.  Will contact poison control for by son treatment if needed of accidental overdose of azithromycin 5 days ago.  The history is provided by the patient.    Past Medical History  Diagnosis Date  . Hypertension   . Hypercholesteremia   . Arthritis   . Dementia     Past Surgical History  Procedure Date  . Prostatectomy     History reviewed. No pertinent family history.  History  Substance Use Topics  . Smoking status: Never Smoker   . Smokeless tobacco: Not on file  . Alcohol Use: Yes     rare      Review of Systems  Constitutional: Negative for activity change.  HENT: Negative.   Eyes: Negative.   Respiratory: Negative for cough and shortness of breath.   Cardiovascular: Negative for chest pain.  Gastrointestinal: Negative for nausea and vomiting.  Genitourinary: Positive for frequency. Negative for dysuria.    Musculoskeletal: Negative.   Neurological: Negative.   Hematological: Negative.   Psychiatric/Behavioral: Negative.     Allergies  Review of patient's allergies indicates no known allergies.  Home Medications   Current Outpatient Rx  Name Route Sig Dispense Refill  . AMLODIPINE BESYLATE 10 MG PO TABS Oral Take 5 mg by mouth daily.      . CHLORTHALIDONE 25 MG PO TABS Oral Take 12.5 mg by mouth daily.      Marland Kitchen VITAMIN D 2000 UNITS PO CAPS Oral Take 1 capsule by mouth daily.      Marland Kitchen ONDANSETRON HCL 4 MG PO TABS Oral Take 4 mg by mouth every 6 (six) hours as needed. For nausea     . ONDANSETRON 4 MG PO TBDP Oral Take 4 mg by mouth daily as needed. For nausea     . SIMVASTATIN 40 MG PO TABS Oral Take 20 mg by mouth at bedtime.      . TERAZOSIN HCL 5 MG PO CAPS Oral Take 5 mg by mouth at bedtime.        BP 142/72  Pulse 56  Temp(Src) 97.9 F (36.6 C) (Oral)  Resp 20  SpO2 95%  Physical Exam  Constitutional: He is oriented to person, place, and time. He appears well-developed and well-nourished.  HENT:  Head: Normocephalic.  Eyes: Pupils are equal, round, and reactive to light.  Neck: Neck supple.  Cardiovascular: Normal rate and  regular rhythm.   Pulmonary/Chest: No respiratory distress. He has no rales.  Abdominal: Soft.  Musculoskeletal: Normal range of motion.  Neurological: He is alert and oriented to person, place, and time.  Skin: Skin is warm and dry.    ED Course  Procedures (including critical care time)  Labs Reviewed  POCT I-STAT, CHEM 8 - Abnormal; Notable for the following:    Creatinine, Ser 1.80 (*)    All other components within normal limits  URINALYSIS, ROUTINE W REFLEX MICROSCOPIC  POCT I-STAT TROPONIN I  I-STAT TROPONIN I  I-STAT, CHEM 8   Dg Chest 2 View  02/05/2011  *RADIOLOGY REPORT*  Clinical Data: Shortness of breath.  Acute bronchitis.  CHEST - 2 VIEW 02/05/2011:  Comparison: Two-view chest x-ray 01/31/2011 [facility], 02/02/2010  [facility], 03/10/2009 and 04/15/2008 [facility].  Findings: Suboptimal inspiration.  Stable mild chronic elevation of the left hemidiaphragm.  Cardiac silhouette mildly enlarged but stable.  Lungs clear.  Bronchovascular markings normal, improved since examination 6 days ago.  No pleural effusions.  Mild degenerative changes involving the thoracic spine.  IMPRESSION: Suboptimal inspiration.  No acute cardiopulmonary disease.  Original Report Authenticated By: Arnell Sieving, M.D.     1. Accidental drug overdose     ED ECG REPORT   Date: 02/06/2011  EKG Time: 12:20 AM  Rate: 49  Rhythm: sinus bradycardia,  normal EKG, normal sinus rhythm, unchanged from previous tracings, RBBB  Axis: normal  Intervals:none  ST&T Change LVH  Narrative Interpretation: unchanged from 01/31/11     Poison control recommends a BUN and Creatinine to evaluate kidney function        MDM  Accidental drug overdose        Arman Filter, NP 02/06/11 0008  Arman Filter, NP 02/06/11 0020

## 2011-02-05 NOTE — Progress Notes (Signed)
77 year old male has had a cough for the last week. He received a prescription for Zithromax 4 days ago and took all the medication in 1 day instead of taking it over 5 days. He states that he still has some cough although it has improved. He has noted some wheezing. On exam, there are faint wheezes noted with forced exhalation. No rales and no rhonchi are heard. He is resting comfortably in no acute distress. Chest x-ray shows no infiltrates. He will be treated with albuterol.

## 2011-02-05 NOTE — ED Notes (Signed)
Pt was seen here last Thursday and dx with bronchitis and was given rx, states that he has been taking his medications wrong. Reports mild cough at night. Denies fevers. Pt reports he is feeling pretty good.

## 2011-02-05 NOTE — ED Notes (Signed)
Patient transported to X-ray and is now back

## 2011-02-06 MED ORDER — ALBUTEROL SULFATE HFA 108 (90 BASE) MCG/ACT IN AERS
2.0000 | INHALATION_SPRAY | RESPIRATORY_TRACT | Status: DC | PRN
  Administered 2011-02-06: 2 via RESPIRATORY_TRACT
  Filled 2011-02-06: qty 6.7

## 2011-02-06 NOTE — ED Provider Notes (Signed)
Medical screening examination/treatment/procedure(s) were conducted as a shared visit with non-physician practitioner(s) and myself.  I personally evaluated the patient during the encounter   Kloe Oates A. Patrica Duel, MD 02/06/11 1347

## 2011-02-06 NOTE — ED Notes (Signed)
Wife and pt requesting to speak with NP about "pump" that pt was suppose to get and about results of test.  NP notified of same.

## 2011-04-12 ENCOUNTER — Encounter: Payer: Self-pay | Admitting: Gastroenterology

## 2011-04-24 ENCOUNTER — Ambulatory Visit (AMBULATORY_SURGERY_CENTER): Payer: Medicare Other | Admitting: *Deleted

## 2011-04-24 ENCOUNTER — Telehealth: Payer: Self-pay | Admitting: *Deleted

## 2011-04-24 VITALS — Ht 74.0 in | Wt 231.4 lb

## 2011-04-24 DIAGNOSIS — Z1211 Encounter for screening for malignant neoplasm of colon: Secondary | ICD-10-CM

## 2011-04-24 MED ORDER — MOVIPREP 100 G PO SOLR: ORAL | Status: DC

## 2011-04-24 NOTE — Telephone Encounter (Signed)
Pt. Is 78 years old and was here for a pre visit today do to receiving a recall letter.He is not having any gi problems and in the chart on your   review on 07/26/10 you indicated no follow-up colonoscopy.Please review the chart as I have placed it on your desk and let us know if scheduled procedure should be cancelled.

## 2011-04-24 NOTE — Telephone Encounter (Signed)
NOT NEEDED

## 2011-04-26 NOTE — Telephone Encounter (Signed)
SPOKE WITH PATIENT EXPLAINED THAT DR.PATTERSON REVIEWED HIS CHART AND STATES HE DID NOT NEED COLONOSCOPY AT THIS TIME. EXPLAINED TO PATIENT IF HE HAD ANY NEW OR GI CONCERNS TO PLEASE CALL us BACK. HE UNDERSTANDS. APPOINTMENT CANCELLED.

## 2011-05-08 ENCOUNTER — Encounter: Payer: Medicare Other | Admitting: Gastroenterology

## 2011-05-13 ENCOUNTER — Encounter: Payer: Medicare Other | Admitting: Gastroenterology

## 2013-09-03 ENCOUNTER — Encounter (HOSPITAL_COMMUNITY): Payer: Self-pay | Admitting: Emergency Medicine

## 2013-09-03 ENCOUNTER — Emergency Department (HOSPITAL_COMMUNITY): Payer: Non-veteran care

## 2013-09-03 ENCOUNTER — Emergency Department (HOSPITAL_COMMUNITY)
Admission: EM | Admit: 2013-09-03 | Discharge: 2013-09-03 | Disposition: A | Discharge status: Home / Self Care | Payer: Non-veteran care | Attending: Emergency Medicine | Admitting: Emergency Medicine

## 2013-09-03 DIAGNOSIS — E7800 Pure hypercholesterolemia, unspecified: Secondary | ICD-10-CM | POA: Insufficient documentation

## 2013-09-03 DIAGNOSIS — I10 Essential (primary) hypertension: Secondary | ICD-10-CM | POA: Insufficient documentation

## 2013-09-03 DIAGNOSIS — IMO0002 Reserved for concepts with insufficient information to code with codable children: Secondary | ICD-10-CM | POA: Insufficient documentation

## 2013-09-03 DIAGNOSIS — M129 Arthropathy, unspecified: Secondary | ICD-10-CM | POA: Insufficient documentation

## 2013-09-03 DIAGNOSIS — R0602 Shortness of breath: Secondary | ICD-10-CM

## 2013-09-03 DIAGNOSIS — E119 Type 2 diabetes mellitus without complications: Secondary | ICD-10-CM | POA: Insufficient documentation

## 2013-09-03 DIAGNOSIS — Z7982 Long term (current) use of aspirin: Secondary | ICD-10-CM | POA: Insufficient documentation

## 2013-09-03 DIAGNOSIS — Z79899 Other long term (current) drug therapy: Secondary | ICD-10-CM | POA: Insufficient documentation

## 2013-09-03 DIAGNOSIS — Z859 Personal history of malignant neoplasm, unspecified: Secondary | ICD-10-CM | POA: Insufficient documentation

## 2013-09-03 DIAGNOSIS — F0390 Unspecified dementia without behavioral disturbance: Secondary | ICD-10-CM | POA: Insufficient documentation

## 2013-09-03 HISTORY — DX: Malignant (primary) neoplasm, unspecified: C80.1

## 2013-09-03 LAB — BASIC METABOLIC PANEL
ANION GAP: 13 (ref 5–15)
BUN: 15 mg/dL (ref 6–23)
CHLORIDE: 106 meq/L (ref 96–112)
CO2: 23 mEq/L (ref 19–32)
CREATININE: 1.22 mg/dL (ref 0.50–1.35)
Calcium: 8.8 mg/dL (ref 8.4–10.5)
GFR, EST AFRICAN AMERICAN: 62 mL/min — AB (ref >90)
GFR, EST NON AFRICAN AMERICAN: 54 mL/min — AB (ref >90)
Glucose, Bld: 106 mg/dL — ABNORMAL HIGH (ref 70–99)
Potassium: 4.1 mEq/L (ref 3.7–5.3)
Sodium: 142 mEq/L (ref 137–147)

## 2013-09-03 LAB — CBC
HCT: 37.9 % — ABNORMAL LOW (ref 39.0–52.0)
Hemoglobin: 12.8 g/dL — ABNORMAL LOW (ref 13.0–17.0)
MCH: 28.7 pg (ref 26.0–34.0)
MCHC: 33.8 g/dL (ref 30.0–36.0)
MCV: 85 fL (ref 78.0–100.0)
PLATELETS: 190 10*3/uL (ref 150–400)
RBC: 4.46 MIL/uL (ref 4.22–5.81)
RDW: 15.2 % (ref 11.5–15.5)
WBC: 4.3 10*3/uL (ref 4.0–10.5)

## 2013-09-03 LAB — I-STAT TROPONIN, ED: Troponin i, poc: 0.01 ng/mL (ref 0.00–0.08)

## 2013-09-03 LAB — PRO B NATRIURETIC PEPTIDE: Pro B Natriuretic peptide (BNP): 66.7 pg/mL (ref 0–450)

## 2013-09-03 LAB — D-DIMER, QUANTITATIVE: D-Dimer, Quant: 1.01 ug/mL-FEU — ABNORMAL HIGH (ref 0.00–0.48)

## 2013-09-03 MED ORDER — IOHEXOL 350 MG/ML SOLN
100.0000 mL | Freq: Once | INTRAVENOUS | Status: AC | PRN
  Administered 2013-09-03: 100 mL via INTRAVENOUS

## 2013-09-03 MED ORDER — IPRATROPIUM-ALBUTEROL 0.5-2.5 (3) MG/3ML IN SOLN
3.0000 mL | Freq: Once | RESPIRATORY_TRACT | Status: AC
  Administered 2013-09-03: 3 mL via RESPIRATORY_TRACT
  Filled 2013-09-03: qty 3

## 2013-09-03 NOTE — ED Notes (Signed)
Pt reports intermittent SOB for several days. Pt reports he received Zoledronic Acid IV on Monday, SOB has seemed to get worse since then. Pt does not appear to be in acute distress at this time. Ax4.

## 2013-09-03 NOTE — ED Provider Notes (Signed)
80 year old male comes in with a several day history of vague dyspnea. He states he feels like he is getting his breath away he should. Dyspnea is not worse with exertion and worse with change in position. There is no associated chest pain. He occasionally has a mild cough. No fever or chills. On exam, there is no neck vein distention. Lungs are clear but there is suggestion of a slightly prolonged exhalation phase. Heart has regular rate and rhythm. Extremities no cyanosis or edema. Initial laboratory workup is negative and ECG is unchanged from baseline. Chest x-ray will be obtained and will be given a therapeutic trial of albuterol with ipratropium. D-dimer will be checked.  I saw and evaluated the patient, reviewed the resident's note and I agree with the findings and plan.   EKG Interpretation   Date/Time:  Friday September 03 2013 17:15:51 EDT Ventricular Rate:  58 PR Interval:  248 QRS Duration: 134 QT Interval:  454 QTC Calculation: 445 R Axis:   -41 Text Interpretation:  Sinus bradycardia with 1st degree A-V block Left  axis deviation Right bundle branch block Minimal voltage criteria for LVH,  may be normal variant Abnormal ECG When compared with ECG of 02/05/2011, No  significant change was found Confirmed by [facility]  MD, Karmelo Bass ([id number]) on  09/03/2013 6:58:33 PM        Delora Fuel, MD 32/44/01 0272

## 2013-09-03 NOTE — Discharge Instructions (Signed)
Shortness of Breath °Shortness of breath means you have trouble breathing. Shortness of breath needs medical care right away. °HOME CARE  °· Do not smoke. °· Avoid being around chemicals or things (paint fumes, dust) that may bother your breathing. °· Rest as needed. Slowly begin your normal activities. °· Only take medicines as told by your doctor. °· Keep all doctor visits as told. °GET HELP RIGHT AWAY IF:  °· Your shortness of breath gets worse. °· You feel lightheaded, pass out (faint), or have a cough that is not helped by medicine. °· You cough up blood. °· You have pain with breathing. °· You have pain in your chest, arms, shoulders, or belly (abdomen). °· You have a fever. °· You cannot walk up stairs or exercise the way you normally do. °· You do not get better in the time expected. °· You have a hard time doing normal activities even with rest. °· You have problems with your medicines. °· You have any new symptoms. °MAKE SURE YOU: °· Understand these instructions. °· Will watch your condition. °· Will get help right away if you are not doing well or get worse. °Document Released: 08/07/2007 Document Revised: 02/23/2013 Document Reviewed: 05/06/2011 °Exit[employer]® Patient Information ©2015 [employer]. This information is not intended to replace advice given to you by your health care provider. Make sure you discuss any questions you have with your health care provider. ° °

## 2013-09-04 NOTE — ED Provider Notes (Signed)
CSN: [id number]     Arrival date & time 09/03/13  1706 History   First MD Initiated Contact with Patient 09/03/13 1821     Chief Complaint  Patient presents with  . Shortness of Breath  . Dizziness   80 y/o male with PMH of DM and HTN that presents with reported worsening SOB. Patient states that he has had worsening symptoms over the last few weeks and was prompted to come in today because "he wanted to get checked out and couldn't get into his regular doctor" He reports no chest pain, diaphoresis, edema, cough/congestion with the SOB. He is further feeling more symptoms with simple activities but is on exam in no acute distress and able to talk in full sentences with out difficulty.    (Consider location/radiation/quality/duration/timing/severity/associated sxs/prior Treatment) Patient is a 80 y.o. male presenting with shortness of breath.  Shortness of Breath Severity:  Mild Onset quality:  Gradual Duration:  3 weeks Chronicity:  New Context: activity   Associated symptoms: no abdominal pain, no chest pain, no cough and no headaches     Past Medical History  Diagnosis Date  . Hypertension   . Hypercholesteremia   . Arthritis   . Dementia   . Diabetes mellitus without complication   . Cancer    Past Surgical History  Procedure Laterality Date  . Prostatectomy    . Cataract extraction  2011 and 2012    2011lt eye and rt eye 2012  . Glauma implant  2012    rt.eye   No family history on file. History  Substance Use Topics  . Smoking status: Never Smoker   . Smokeless tobacco: Never Used  . Alcohol Use: 1.2 oz/week    2 Shots of liquor per week     Comment: rare    Review of Systems  Constitutional: Negative for activity change.  HENT: Negative for congestion.   Eyes: Negative for visual disturbance.  Respiratory: Positive for shortness of breath. Negative for cough.   Cardiovascular: Negative for chest pain and leg swelling.  Gastrointestinal: Negative for  abdominal pain and blood in stool.  Genitourinary: Negative for dysuria and hematuria.  Musculoskeletal: Negative for back pain.  Skin: Negative for color change.  Neurological: Negative for syncope and headaches.  Psychiatric/Behavioral: Negative for agitation.      Allergies  Review of patient's allergies indicates no known allergies.  Home Medications   Prior to Admission medications   Medication Sig Start Date End Date Taking? Authorizing Provider  acetaminophen (TYLENOL) 500 MG tablet Take 1,000 mg by mouth 2 (two) times daily.   Yes Historical Provider, MD  allopurinol (ZYLOPRIM) 100 MG tablet Take 100 mg by mouth daily.   Yes Historical Provider, MD  amLODipine (NORVASC) 10 MG tablet Take 10 mg by mouth daily.    Yes Historical Provider, MD  aspirin EC 81 MG tablet Take 81 mg by mouth daily.   Yes Historical Provider, MD  atorvastatin (LIPITOR) 40 MG tablet Take 20 mg by mouth at bedtime.   Yes Historical Provider, MD  Calcium Carbonate-Vitamin D (CALCIUM 600+D) 600-400 MG-UNIT per tablet Take 1 tablet by mouth 2 (two) times daily.   Yes Historical Provider, MD  cholecalciferol (VITAMIN D) 1000 UNITS tablet Take 1,000 Units by mouth daily.   Yes Historical Provider, MD  dorzolamide-timolol (COSOPT) 22.3-6.8 MG/ML ophthalmic solution Place 2 drops into both eyes Twice daily. 04/21/11  Yes Historical Provider, MD  ketorolac (ACULAR) 0.5 % ophthalmic solution Place 1 drop into  the left eye 2 (two) times daily.    Yes Historical Provider, MD  polyvinyl alcohol (LIQUIFILM TEARS) 1.4 % ophthalmic solution Place 1 drop into both eyes 3 (three) times daily as needed for dry eyes.   Yes Historical Provider, MD  prednisoLONE acetate (PRED FORTE) 1 % ophthalmic suspension Place 1 drop into both eyes 2 times daily. 04/12/11  Yes Historical Provider, MD  PRESCRIPTION MEDICATION Take 3 tablets by mouth daily as needed (pain). Arthritis pain medication   Yes Historical Provider, MD  tamsulosin  (FLOMAX) 0.4 MG CAPS capsule Take 0.4 mg by mouth at bedtime.   Yes Historical Provider, MD   BP 137/75  Pulse 53  Temp(Src) 98 F (36.7 C) (Oral)  Resp 21  SpO2 97% Physical Exam  Nursing note and vitals reviewed. Constitutional: He is oriented to person, place, and time. He appears well-developed and well-nourished.  HENT:  Head: Normocephalic.  Eyes: Pupils are equal, round, and reactive to light.  Neck: Neck supple. No JVD present. No tracheal deviation present.  Cardiovascular: Normal rate and regular rhythm.  Exam reveals no gallop and no friction rub.   No murmur heard. Pulmonary/Chest: Effort normal. No respiratory distress. He has no wheezes. He has no rales.  Abdominal: Soft. He exhibits no distension. There is no tenderness.  Musculoskeletal: He exhibits no edema.  Neurological: He is alert and oriented to person, place, and time.  Skin: Skin is warm.  Psychiatric: He has a normal mood and affect.    ED Course  Procedures (including critical care time) Labs Review Labs Reviewed  CBC - Abnormal; Notable for the following:    Hemoglobin 12.8 (*)    HCT 37.9 (*)    All other components within normal limits  BASIC METABOLIC PANEL - Abnormal; Notable for the following:    Glucose, Bld 106 (*)    GFR calc non Af Amer 54 (*)    GFR calc Af Amer 62 (*)    All other components within normal limits  D-DIMER, QUANTITATIVE - Abnormal; Notable for the following:    D-Dimer, Quant 1.01 (*)    All other components within normal limits  PRO B NATRIURETIC PEPTIDE  I-STAT TROPOININ, ED    Imaging Review Ct Angio Chest W/cm &/or Wo Cm  09/03/2013   CLINICAL DATA:  Intermittent shortness of breath for several days.  EXAM: CT ANGIOGRAPHY CHEST WITH CONTRAST  TECHNIQUE: Multidetector CT imaging of the chest was performed using the standard protocol during bolus administration of intravenous contrast. Multiplanar CT image reconstructions and MIPs were obtained to evaluate the  vascular anatomy.  CONTRAST:  180mL OMNIPAQUE IOHEXOL 350 MG/ML SOLN  COMPARISON:  Portable chest x-ray of today's date and PA and lateral chest x-ray of February 05, 2011  FINDINGS: Contrast within the pulmonary arterial tree is normal. There are no filling defects to suggest an acute pulmonary embolism. The caliber of the thoracic aorta is normal. The cardiac chambers are normal. There are coronary artery calcifications. There is no mediastinal nor hilar lymphadenopathy. There is no pleural nor pericardial effusion.  At lung window settings there is minimal compressive atelectasis posteriorly. There is no alveolar pneumonia. There are no pulmonary parenchymal nodules or masses.  Within the upper abdomen the observed portions of the liver, spleen, and adrenal glands are normal. The thoracic spine, sternum, and visualized ribs exhibit no acute abnormalities. There are mild degenerative changes of the thoracic spine.  Review of the MIP images confirms the above findings.  IMPRESSION:  1. There is no acute pulmonary embolism nor other acute cardiopulmonary abnormality. There is coronary atherosclerosis. 2. There is no lymphadenopathy nor pleural effusion or pneumothorax.   Electronically Signed   By: David  Martinique   On: 09/03/2013 21:12   Dg Chest Port 1 View  09/03/2013   CLINICAL DATA:  Shortness of breath and hypertension.  EXAM: PORTABLE CHEST - 1 VIEW  COMPARISON:  PA and lateral chest of February 05, 2011  FINDINGS: The lungs are adequately inflated and clear. The heart is top-normal in size. The pulmonary vascularity is not engorged. There is no pleural effusion. The bony thorax is unremarkable.  IMPRESSION: There is no acute cardiopulmonary disease.   Electronically Signed   By: David  Martinique   On: 09/03/2013 19:26     EKG Interpretation   Date/Time:  Friday September 03 2013 17:15:51 EDT Ventricular Rate:  58 PR Interval:  248 QRS Duration: 134 QT Interval:  454 QTC Calculation: 445 R Axis:   -41 Text  Interpretation:  Sinus bradycardia with 1st degree A-V block Left  axis deviation Right bundle branch block Minimal voltage criteria for LVH,  may be normal variant Abnormal ECG When compared with ECG of 02/05/2011, No  significant change was found Confirmed by [facility]  MD, DAVID ([id number]) on  09/03/2013 6:58:33 PM      MDM   Final diagnoses:  SOB (shortness of breath)   80 y/o male presents with vague dyspnea that he reports has been presents for the past few weeks. He has no other associated symptoms and reports some worsening with activities, such as singing. The patient on physical exam doe not have JVD, pitting edema, or rhonchi on lung exam. Heart is RRR with no murmurs.   Patient evaluated with EKG, CXR, BNP, CBC, BMP, trop and D dimer.   The D-dimer did come back positive and was followed-up with a CT angio that confirmed that a PE was not present.  Patient had sinus bradycardia on EKG which was determined to be a normal variant. Otherwise the patient had a negative work-up. Patient was informed of diagnostic findings and was discharged home with instructions to follow-up with PCP if symptoms continue.     Claudean Severance, MD 09/04/13 1240

## 2014-03-04 DIAGNOSIS — I639 Cerebral infarction, unspecified: Secondary | ICD-10-CM

## 2014-03-04 HISTORY — DX: Cerebral infarction, unspecified: I63.9

## 2014-09-15 ENCOUNTER — Encounter (INDEPENDENT_AMBULATORY_CARE_PROVIDER_SITE_OTHER): Payer: Medicare Other | Admitting: Ophthalmology

## 2014-09-15 DIAGNOSIS — H34811 Central retinal vein occlusion, right eye: Secondary | ICD-10-CM | POA: Diagnosis not present

## 2014-09-15 DIAGNOSIS — H35033 Hypertensive retinopathy, bilateral: Secondary | ICD-10-CM | POA: Diagnosis not present

## 2014-09-15 DIAGNOSIS — H43813 Vitreous degeneration, bilateral: Secondary | ICD-10-CM

## 2014-09-15 DIAGNOSIS — I10 Essential (primary) hypertension: Secondary | ICD-10-CM | POA: Diagnosis not present

## 2014-10-10 ENCOUNTER — Encounter (INDEPENDENT_AMBULATORY_CARE_PROVIDER_SITE_OTHER): Payer: Medicare Other | Admitting: Ophthalmology

## 2014-10-10 DIAGNOSIS — H35033 Hypertensive retinopathy, bilateral: Secondary | ICD-10-CM

## 2014-10-10 DIAGNOSIS — I10 Essential (primary) hypertension: Secondary | ICD-10-CM | POA: Diagnosis not present

## 2014-10-10 DIAGNOSIS — H43813 Vitreous degeneration, bilateral: Secondary | ICD-10-CM | POA: Diagnosis not present

## 2014-10-10 DIAGNOSIS — H34811 Central retinal vein occlusion, right eye: Secondary | ICD-10-CM

## 2014-10-14 ENCOUNTER — Encounter (HOSPITAL_COMMUNITY): Payer: Self-pay

## 2014-10-14 ENCOUNTER — Emergency Department (HOSPITAL_COMMUNITY)
Admission: EM | Admit: 2014-10-14 | Discharge: 2014-10-14 | Disposition: A | Discharge status: Home / Self Care | Payer: Medicare Other | Attending: Emergency Medicine | Admitting: Emergency Medicine

## 2014-10-14 ENCOUNTER — Emergency Department (HOSPITAL_COMMUNITY): Payer: Medicare Other

## 2014-10-14 DIAGNOSIS — E876 Hypokalemia: Secondary | ICD-10-CM | POA: Diagnosis not present

## 2014-10-14 DIAGNOSIS — Z9861 Coronary angioplasty status: Secondary | ICD-10-CM | POA: Diagnosis not present

## 2014-10-14 DIAGNOSIS — R531 Weakness: Secondary | ICD-10-CM

## 2014-10-14 DIAGNOSIS — F0390 Unspecified dementia without behavioral disturbance: Secondary | ICD-10-CM | POA: Diagnosis not present

## 2014-10-14 DIAGNOSIS — Z79899 Other long term (current) drug therapy: Secondary | ICD-10-CM | POA: Insufficient documentation

## 2014-10-14 DIAGNOSIS — I10 Essential (primary) hypertension: Secondary | ICD-10-CM | POA: Diagnosis not present

## 2014-10-14 DIAGNOSIS — Z7982 Long term (current) use of aspirin: Secondary | ICD-10-CM | POA: Diagnosis not present

## 2014-10-14 DIAGNOSIS — E780 Pure hypercholesterolemia: Secondary | ICD-10-CM | POA: Diagnosis not present

## 2014-10-14 DIAGNOSIS — M1990 Unspecified osteoarthritis, unspecified site: Secondary | ICD-10-CM | POA: Insufficient documentation

## 2014-10-14 DIAGNOSIS — E119 Type 2 diabetes mellitus without complications: Secondary | ICD-10-CM | POA: Diagnosis not present

## 2014-10-14 DIAGNOSIS — Z859 Personal history of malignant neoplasm, unspecified: Secondary | ICD-10-CM | POA: Diagnosis not present

## 2014-10-14 DIAGNOSIS — R0602 Shortness of breath: Secondary | ICD-10-CM

## 2014-10-14 LAB — URINALYSIS, ROUTINE W REFLEX MICROSCOPIC
Bilirubin Urine: NEGATIVE
Glucose, UA: NEGATIVE mg/dL
Ketones, ur: NEGATIVE mg/dL
Leukocytes, UA: NEGATIVE
NITRITE: NEGATIVE
Protein, ur: NEGATIVE mg/dL
SPECIFIC GRAVITY, URINE: 1.015 (ref 1.005–1.030)
UROBILINOGEN UA: 0.2 mg/dL (ref 0.0–1.0)
pH: 5.5 (ref 5.0–8.0)

## 2014-10-14 LAB — CBC WITH DIFFERENTIAL/PLATELET
Basophils Absolute: 0 10*3/uL (ref 0.0–0.1)
Basophils Relative: 0 % (ref 0–1)
EOS ABS: 0.1 10*3/uL (ref 0.0–0.7)
Eosinophils Relative: 1 % (ref 0–5)
HCT: 41.1 % (ref 39.0–52.0)
HEMOGLOBIN: 13.9 g/dL (ref 13.0–17.0)
Lymphocytes Relative: 24 % (ref 12–46)
Lymphs Abs: 1.4 10*3/uL (ref 0.7–4.0)
MCH: 28.8 pg (ref 26.0–34.0)
MCHC: 33.8 g/dL (ref 30.0–36.0)
MCV: 85.1 fL (ref 78.0–100.0)
Monocytes Absolute: 0.5 10*3/uL (ref 0.1–1.0)
Monocytes Relative: 9 % (ref 3–12)
NEUTROS ABS: 3.7 10*3/uL (ref 1.7–7.7)
NEUTROS PCT: 66 % (ref 43–77)
PLATELETS: 201 10*3/uL (ref 150–400)
RBC: 4.83 MIL/uL (ref 4.22–5.81)
RDW: 14.3 % (ref 11.5–15.5)
WBC: 5.6 10*3/uL (ref 4.0–10.5)

## 2014-10-14 LAB — COMPREHENSIVE METABOLIC PANEL
ALBUMIN: 3.9 g/dL (ref 3.5–5.0)
ALT: 15 U/L — ABNORMAL LOW (ref 17–63)
AST: 18 U/L (ref 15–41)
Alkaline Phosphatase: 62 U/L (ref 38–126)
Anion gap: 10 (ref 5–15)
BUN: 10 mg/dL (ref 6–20)
CALCIUM: 9.5 mg/dL (ref 8.9–10.3)
CO2: 23 mmol/L (ref 22–32)
CREATININE: 1.09 mg/dL (ref 0.61–1.24)
Chloride: 104 mmol/L (ref 101–111)
Glucose, Bld: 154 mg/dL — ABNORMAL HIGH (ref 65–99)
Potassium: 3 mmol/L — ABNORMAL LOW (ref 3.5–5.1)
Sodium: 137 mmol/L (ref 135–145)
Total Bilirubin: 0.8 mg/dL (ref 0.3–1.2)
Total Protein: 7.8 g/dL (ref 6.5–8.1)

## 2014-10-14 LAB — URINE MICROSCOPIC-ADD ON

## 2014-10-14 LAB — TROPONIN I

## 2014-10-14 MED ORDER — ONDANSETRON HCL 4 MG/2ML IJ SOLN
4.0000 mg | Freq: Once | INTRAMUSCULAR | Status: AC
  Administered 2014-10-14: 4 mg via INTRAVENOUS
  Filled 2014-10-14: qty 2

## 2014-10-14 MED ORDER — SODIUM CHLORIDE 0.9 % IV SOLN
INTRAVENOUS | Status: DC
  Administered 2014-10-14: 08:00:00 via INTRAVENOUS

## 2014-10-14 MED ORDER — SODIUM CHLORIDE 0.9 % IV BOLUS (SEPSIS)
500.0000 mL | Freq: Once | INTRAVENOUS | Status: AC
  Administered 2014-10-14: 500 mL via INTRAVENOUS

## 2014-10-14 MED ORDER — POTASSIUM CHLORIDE CRYS ER 20 MEQ PO TBCR
40.0000 meq | EXTENDED_RELEASE_TABLET | Freq: Once | ORAL | Status: AC
  Administered 2014-10-14: 40 meq via ORAL
  Filled 2014-10-14: qty 2

## 2014-10-14 NOTE — ED Provider Notes (Signed)
CSN: [id number]     Arrival date & time 10/14/14  5400 History   First MD Initiated Contact with Patient 10/14/14 0719     Chief Complaint  Patient presents with  . Weakness  . Dizziness     (Consider location/radiation/quality/duration/timing/severity/associated sxs/prior Treatment) HPI Comments: Patient here complaining of bilateral lower extremity weakness is worse with standing. Denies any recent illnesses. Denies any back pain or fever. No reported history of confusion or other focal neurological deficits. No prior history of same. No recent medication changes. Denies any urinary symptoms. Symptoms present for 3 days and described as whole-body weakness when he tries to stand worse in his legs. Denies any syncope or near-syncope. No associated chest pain or shortness of breath. Symptoms persistent and nothing makes them better. No treatment use prior to arrival  Patient is a 81 y.o. male presenting with weakness and dizziness. The history is provided by the patient and a relative.  Weakness  Dizziness Associated symptoms: weakness     Past Medical History  Diagnosis Date  . Hypertension   . Hypercholesteremia   . Arthritis   . Dementia   . Diabetes mellitus without complication   . Cancer    Past Surgical History  Procedure Laterality Date  . Prostatectomy    . Cataract extraction  2011 and 2012    2011lt eye and rt eye 2012  . Glauma implant  2012    rt.eye  . Coronary angioplasty with stent placement     History reviewed. No pertinent family history. Social History  Substance Use Topics  . Smoking status: Never Smoker   . Smokeless tobacco: Never Used  . Alcohol Use: 1.2 oz/week    2 Shots of liquor per week     Comment: rare    Review of Systems  Neurological: Positive for dizziness and weakness.  All other systems reviewed and are negative.     Allergies  Review of patient's allergies indicates no known allergies.  Home Medications   Prior to  Admission medications   Medication Sig Start Date End Date Taking? Authorizing Provider  acetaminophen (TYLENOL) 500 MG tablet Take 1,000 mg by mouth 2 (two) times daily.    Historical Provider, MD  allopurinol (ZYLOPRIM) 100 MG tablet Take 100 mg by mouth daily.    Historical Provider, MD  amLODipine (NORVASC) 10 MG tablet Take 10 mg by mouth daily.     Historical Provider, MD  aspirin EC 81 MG tablet Take 81 mg by mouth daily.    Historical Provider, MD  atorvastatin (LIPITOR) 40 MG tablet Take 20 mg by mouth at bedtime.    Historical Provider, MD  Calcium Carbonate-Vitamin D (CALCIUM 600+D) 600-400 MG-UNIT per tablet Take 1 tablet by mouth 2 (two) times daily.    Historical Provider, MD  cholecalciferol (VITAMIN D) 1000 UNITS tablet Take 1,000 Units by mouth daily.    Historical Provider, MD  dorzolamide-timolol (COSOPT) 22.3-6.8 MG/ML ophthalmic solution Place 2 drops into both eyes Twice daily. 04/21/11   Historical Provider, MD  ketorolac (ACULAR) 0.5 % ophthalmic solution Place 1 drop into the left eye 2 (two) times daily.     Historical Provider, MD  polyvinyl alcohol (LIQUIFILM TEARS) 1.4 % ophthalmic solution Place 1 drop into both eyes 3 (three) times daily as needed for dry eyes.    Historical Provider, MD  prednisoLONE acetate (PRED FORTE) 1 % ophthalmic suspension Place 1 drop into both eyes 2 times daily. 04/12/11   Historical Provider, MD  PRESCRIPTION MEDICATION Take 3 tablets by mouth daily as needed (pain). Arthritis pain medication    Historical Provider, MD  tamsulosin (FLOMAX) 0.4 MG CAPS capsule Take 0.4 mg by mouth at bedtime.    Historical Provider, MD   BP 166/87 mmHg  Pulse 90  Temp(Src) 97.8 F (36.6 C) (Oral)  Resp 20  Ht 6\' 2"  (1.88 m)  Wt 230 lb (104.327 kg)  BMI 29.52 kg/m2  SpO2 95% Physical Exam  Constitutional: He is oriented to person, place, and time. He appears well-developed and well-nourished.  Non-toxic appearance. No distress.  HENT:  Head:  Normocephalic and atraumatic.  Eyes: Conjunctivae, EOM and lids are normal. Pupils are equal, round, and reactive to light.  Neck: Normal range of motion. Neck supple. No tracheal deviation present. No thyroid mass present.  Cardiovascular: Normal rate, regular rhythm and normal heart sounds.  Exam reveals no gallop.   No murmur heard. Pulmonary/Chest: Effort normal and breath sounds normal. No stridor. No respiratory distress. He has no decreased breath sounds. He has no wheezes. He has no rhonchi. He has no rales.  Abdominal: Soft. Normal appearance and bowel sounds are normal. He exhibits no distension. There is no tenderness. There is no rebound and no CVA tenderness.  Musculoskeletal: Normal range of motion. He exhibits no edema or tenderness.  Neurological: He is alert and oriented to person, place, and time. He has normal strength. No cranial nerve deficit or sensory deficit. GCS eye subscore is 4. GCS verbal subscore is 5. GCS motor subscore is 6.  Skin: Skin is warm and dry. No abrasion and no rash noted.  Psychiatric: He has a normal mood and affect. His speech is normal and behavior is normal.  Nursing note and vitals reviewed.   ED Course  Procedures (including critical care time) Labs Review Labs Reviewed  URINE CULTURE  CBC WITH DIFFERENTIAL/PLATELET  COMPREHENSIVE METABOLIC PANEL  TROPONIN I  URINALYSIS, ROUTINE W REFLEX MICROSCOPIC (NOT AT [facility])    Imaging Review No results found. I, Leota Jacobsen, personally reviewed and evaluated these images and lab results as part of my medical decision-making.   EKG Interpretation None      MDM   Final diagnoses:  SOB (shortness of breath)    EKG Interpretation  Date/Time:  Friday October 14 2014 07:25:29 EDT Ventricular Rate:  89 PR Interval:  227 QRS Duration: 148 QT Interval:  389 QTC Calculation: 473 R Axis:   -59 Text Interpretation:  Sinus rhythm Multiple ventricular premature complexes Prolonged PR interval  RBBB and LAFB LVH by voltage No significant change since last tracing Confirmed by Candie Gintz  MD, An Schnabel ([id number]) on 10/14/2014 7:38:18 AM       Patient was able to ambulate here feels better. Potassium was replaced. Been able to complete the current MRI study but there was some suspicion of metastatic disease. His daughter has already made an appointment with the oncologist for follow-up. Patient likely to go home at this time.    Lacretia Leigh, MD 10/14/14 1359

## 2014-10-14 NOTE — ED Notes (Signed)
MRI reports will be present to transfer pt to MRI in 15-20 minutes.

## 2014-10-14 NOTE — ED Notes (Signed)
Patient transported to MRI 

## 2014-10-14 NOTE — Discharge Instructions (Signed)

## 2014-10-14 NOTE — Progress Notes (Signed)
Mri thoracic spine without was completed. During the mri of the lumbar spine pt stated he would not do any further imaging. Pt was informed that this test was important and needed to be completed. Pt was told he had another 20 mins left. Offered pt medication. He said he wasn't claustrophobic but couldn't stand the noise any longer. Pt wanted out of scanner and refused any further imaging. Pt refused medication due to he said it was just too loud and couldn't do any more.

## 2014-10-14 NOTE — ED Notes (Signed)
Pt transported to MRI 

## 2014-10-14 NOTE — ED Notes (Signed)
[phone] Mona-daughter would like to be called when he is ready to be discharged.

## 2014-10-14 NOTE — ED Notes (Signed)
AVS explained in detail. Knows to eat foods high in Potassium. No other c/c.

## 2014-10-14 NOTE — ED Notes (Signed)
Pt ambulated a short distance with cane. Denied dizziness. Ambulatory with steady gait. Given peanut butter crackers. A&Ox4. RR even/unlabored. MD aware.

## 2014-10-14 NOTE — ED Notes (Signed)
Patient c/o weakness and dizziness since yesterday. Patient denies any V/D, pain or fever. Patient states he has had SOB as well.

## 2014-10-15 LAB — URINE CULTURE: Culture: 7000

## 2014-10-17 ENCOUNTER — Encounter (HOSPITAL_COMMUNITY): Payer: Self-pay | Admitting: Family Medicine

## 2014-10-17 ENCOUNTER — Inpatient Hospital Stay (HOSPITAL_COMMUNITY)
Admission: EM | Admit: 2014-10-17 | Discharge: 2014-10-20 | DRG: 066 | Disposition: A | Discharge status: Skilled Nursing Facility | Payer: Medicare Other | Attending: Internal Medicine | Admitting: Internal Medicine

## 2014-10-17 ENCOUNTER — Emergency Department (HOSPITAL_COMMUNITY): Payer: Medicare Other

## 2014-10-17 DIAGNOSIS — I10 Essential (primary) hypertension: Secondary | ICD-10-CM | POA: Diagnosis present

## 2014-10-17 DIAGNOSIS — I739 Peripheral vascular disease, unspecified: Secondary | ICD-10-CM | POA: Diagnosis present

## 2014-10-17 DIAGNOSIS — I2510 Atherosclerotic heart disease of native coronary artery without angina pectoris: Secondary | ICD-10-CM | POA: Diagnosis present

## 2014-10-17 DIAGNOSIS — Z7902 Long term (current) use of antithrombotics/antiplatelets: Secondary | ICD-10-CM

## 2014-10-17 DIAGNOSIS — Z791 Long term (current) use of non-steroidal anti-inflammatories (NSAID): Secondary | ICD-10-CM

## 2014-10-17 DIAGNOSIS — E876 Hypokalemia: Secondary | ICD-10-CM | POA: Diagnosis present

## 2014-10-17 DIAGNOSIS — N400 Enlarged prostate without lower urinary tract symptoms: Secondary | ICD-10-CM | POA: Diagnosis present

## 2014-10-17 DIAGNOSIS — I63311 Cerebral infarction due to thrombosis of right middle cerebral artery: Secondary | ICD-10-CM | POA: Diagnosis not present

## 2014-10-17 DIAGNOSIS — C61 Malignant neoplasm of prostate: Secondary | ICD-10-CM

## 2014-10-17 DIAGNOSIS — Z79899 Other long term (current) drug therapy: Secondary | ICD-10-CM

## 2014-10-17 DIAGNOSIS — Z8673 Personal history of transient ischemic attack (TIA), and cerebral infarction without residual deficits: Secondary | ICD-10-CM | POA: Diagnosis present

## 2014-10-17 DIAGNOSIS — Z955 Presence of coronary angioplasty implant and graft: Secondary | ICD-10-CM

## 2014-10-17 DIAGNOSIS — R269 Unspecified abnormalities of gait and mobility: Secondary | ICD-10-CM

## 2014-10-17 DIAGNOSIS — F0390 Unspecified dementia without behavioral disturbance: Secondary | ICD-10-CM | POA: Diagnosis present

## 2014-10-17 DIAGNOSIS — E7800 Pure hypercholesterolemia, unspecified: Secondary | ICD-10-CM | POA: Diagnosis not present

## 2014-10-17 DIAGNOSIS — Z9079 Acquired absence of other genital organ(s): Secondary | ICD-10-CM | POA: Diagnosis present

## 2014-10-17 DIAGNOSIS — I639 Cerebral infarction, unspecified: Secondary | ICD-10-CM

## 2014-10-17 DIAGNOSIS — H5442 Blindness, left eye, normal vision right eye: Secondary | ICD-10-CM | POA: Diagnosis present

## 2014-10-17 DIAGNOSIS — R482 Apraxia: Secondary | ICD-10-CM | POA: Diagnosis present

## 2014-10-17 DIAGNOSIS — Z7982 Long term (current) use of aspirin: Secondary | ICD-10-CM

## 2014-10-17 DIAGNOSIS — Z8546 Personal history of malignant neoplasm of prostate: Secondary | ICD-10-CM

## 2014-10-17 DIAGNOSIS — E785 Hyperlipidemia, unspecified: Secondary | ICD-10-CM | POA: Diagnosis present

## 2014-10-17 DIAGNOSIS — E119 Type 2 diabetes mellitus without complications: Secondary | ICD-10-CM

## 2014-10-17 LAB — DIFFERENTIAL
BASOS ABS: 0 10*3/uL (ref 0.0–0.1)
BASOS PCT: 1 % (ref 0–1)
EOS ABS: 0.1 10*3/uL (ref 0.0–0.7)
Eosinophils Relative: 2 % (ref 0–5)
Lymphocytes Relative: 30 % (ref 12–46)
Lymphs Abs: 1.7 10*3/uL (ref 0.7–4.0)
MONO ABS: 0.5 10*3/uL (ref 0.1–1.0)
MONOS PCT: 9 % (ref 3–12)
Neutro Abs: 3.4 10*3/uL (ref 1.7–7.7)
Neutrophils Relative %: 60 % (ref 43–77)

## 2014-10-17 LAB — COMPREHENSIVE METABOLIC PANEL
ALT: 17 U/L (ref 17–63)
AST: 22 U/L (ref 15–41)
Albumin: 4 g/dL (ref 3.5–5.0)
Alkaline Phosphatase: 67 U/L (ref 38–126)
Anion gap: 9 (ref 5–15)
BUN: 11 mg/dL (ref 6–20)
CHLORIDE: 102 mmol/L (ref 101–111)
CO2: 27 mmol/L (ref 22–32)
Calcium: 10.1 mg/dL (ref 8.9–10.3)
Creatinine, Ser: 1.17 mg/dL (ref 0.61–1.24)
GFR, EST NON AFRICAN AMERICAN: 56 mL/min — AB (ref >60)
Glucose, Bld: 112 mg/dL — ABNORMAL HIGH (ref 65–99)
POTASSIUM: 3.9 mmol/L (ref 3.5–5.1)
SODIUM: 138 mmol/L (ref 135–145)
Total Bilirubin: 0.5 mg/dL (ref 0.3–1.2)
Total Protein: 8 g/dL (ref 6.5–8.1)

## 2014-10-17 LAB — I-STAT CHEM 8, ED
BUN: 14 mg/dL (ref 6–20)
Calcium, Ion: 1.29 mmol/L (ref 1.13–1.30)
Chloride: 102 mmol/L (ref 101–111)
Creatinine, Ser: 1 mg/dL (ref 0.61–1.24)
Glucose, Bld: 112 mg/dL — ABNORMAL HIGH (ref 65–99)
HEMATOCRIT: 50 % (ref 39.0–52.0)
HEMOGLOBIN: 17 g/dL (ref 13.0–17.0)
Potassium: 3.9 mmol/L (ref 3.5–5.1)
SODIUM: 141 mmol/L (ref 135–145)
TCO2: 24 mmol/L (ref 0–100)

## 2014-10-17 LAB — CBC
HEMATOCRIT: 43.4 % (ref 39.0–52.0)
Hemoglobin: 14.8 g/dL (ref 13.0–17.0)
MCH: 29 pg (ref 26.0–34.0)
MCHC: 34.1 g/dL (ref 30.0–36.0)
MCV: 84.9 fL (ref 78.0–100.0)
PLATELETS: 242 10*3/uL (ref 150–400)
RBC: 5.11 MIL/uL (ref 4.22–5.81)
RDW: 14.7 % (ref 11.5–15.5)
WBC: 5.8 10*3/uL (ref 4.0–10.5)

## 2014-10-17 LAB — PROTIME-INR
INR: 1.06 (ref 0.00–1.49)
PROTHROMBIN TIME: 14 s (ref 11.6–15.2)

## 2014-10-17 LAB — APTT: APTT: 36 s (ref 24–37)

## 2014-10-17 LAB — CBG MONITORING, ED: GLUCOSE-CAPILLARY: 102 mg/dL — AB (ref 65–99)

## 2014-10-17 LAB — I-STAT TROPONIN, ED: TROPONIN I, POC: 0.01 ng/mL (ref 0.00–0.08)

## 2014-10-17 NOTE — ED Provider Notes (Signed)
CSN: [id number]     Arrival date & time 10/17/14  90 History   First MD Initiated Contact with Patient 10/17/14 1816     Chief Complaint  Patient presents with  . Weakness     (Consider location/radiation/quality/duration/timing/severity/associated sxs/prior Treatment) HPI The patient has had developed walking. He had been seen on Thursday at [facility] for some difficulty and a fall. At that time he was evaluated with MRI and his gait was stable. The patient was discharged. His family members report the following day, Friday evening he had a significant change in his gait function and was very weak and fell. They report that he used to use a cane intermittently, but as of Friday evening he needed it constantly and has had difficulty walking. They deny fever or chills. The patient denies being ill recently. He denies any pain associated with this. Family believes they also noted some droop of the left side of his face. They also report that he was unable to hold his left foot up to put his shoe on. At baseline the patient has had a steady and coordinated gait with good muscle strength. Past Medical History  Diagnosis Date  . Hypertension   . Hypercholesteremia   . Arthritis   . Dementia   . Diabetes mellitus without complication   . Cancer    Past Surgical History  Procedure Laterality Date  . Prostatectomy    . Cataract extraction  2011 and 2012    2011lt eye and rt eye 2012  . Glauma implant  2012    rt.eye  . Coronary angioplasty with stent placement     History reviewed. No pertinent family history. Social History  Substance Use Topics  . Smoking status: Never Smoker   . Smokeless tobacco: Never Used  . Alcohol Use: 1.2 oz/week    2 Shots of liquor per week     Comment: rare    Review of Systems  10 Systems reviewed and are negative for acute change except as noted in the HPI.   Allergies  Review of patient's allergies indicates no known allergies.  Home  Medications   Prior to Admission medications   Medication Sig Start Date End Date Taking? Authorizing Provider  acetaminophen (TYLENOL) 500 MG tablet Take 1,000 mg by mouth every 6 (six) hours as needed (for pain).    Yes Historical Provider, MD  allopurinol (ZYLOPRIM) 100 MG tablet Take 200 mg by mouth daily.    Yes Historical Provider, MD  amLODipine (NORVASC) 10 MG tablet Take 5 mg by mouth daily.    Yes Historical Provider, MD  aspirin EC 81 MG tablet Take 81 mg by mouth daily.   Yes Historical Provider, MD  atorvastatin (LIPITOR) 40 MG tablet Take 20 mg by mouth at bedtime.   Yes Historical Provider, MD  Calcium Carbonate-Vitamin D (CALCIUM 600+D) 600-400 MG-UNIT per tablet Take 1 tablet by mouth 2 (two) times daily.   Yes Historical Provider, MD  cholecalciferol (VITAMIN D) 1000 UNITS tablet Take 1,000 Units by mouth daily.   Yes Historical Provider, MD  clopidogrel (PLAVIX) 75 MG tablet Take 75 mg by mouth daily.   Yes Historical Provider, MD  dorzolamide-timolol (COSOPT) 22.3-6.8 MG/ML ophthalmic solution Place 2 drops into both eyes Twice daily. 04/21/11  Yes Historical Provider, MD  polyvinyl alcohol (LIQUIFILM TEARS) 1.4 % ophthalmic solution Place 1 drop into both eyes 3 (three) times daily as needed for dry eyes.   Yes Historical Provider, MD  prednisoLONE acetate (  PRED FORTE) 1 % ophthalmic suspension Place 1 drop into the right eye daily.  04/12/11  Yes Historical Provider, MD  tamsulosin (FLOMAX) 0.4 MG CAPS capsule Take 0.4 mg by mouth at bedtime.   Yes Historical Provider, MD   BP 163/77 mmHg  Pulse 54  Temp(Src) 98.5 F (36.9 C) (Oral)  Resp 20  Ht 6' 2.5" (1.892 m)  Wt 233 lb (105.688 kg)  BMI 29.52 kg/m2  SpO2 94% Physical Exam  Constitutional: He is oriented to person, place, and time. He appears well-developed and well-nourished.  HENT:  Head: Normocephalic and atraumatic.  Eyes: EOM are normal. Pupils are equal, round, and reactive to light.  Neck: Neck supple.   Cardiovascular: Normal rate, regular rhythm, normal heart sounds and intact distal pulses.   Pulmonary/Chest: Effort normal and breath sounds normal.  Abdominal: Soft. Bowel sounds are normal. He exhibits no distension. There is no tenderness.  Musculoskeletal: Normal range of motion. He exhibits no edema.  Neurological: He is alert and oriented to person, place, and time. He has normal strength. No cranial nerve deficit. He exhibits normal muscle tone. Coordination abnormal. GCS eye subscore is 4. GCS verbal subscore is 5. GCS motor subscore is 6.  Patient has good muscle strength testing and good muscle tone in the supine position. With ambulation the patient has a broad-based gait and the appearance of a likely toe drop with imbalance.  Skin: Skin is warm, dry and intact.  Psychiatric: He has a normal mood and affect.    ED Course  Procedures (including critical care time) Labs Review Labs Reviewed  COMPREHENSIVE METABOLIC PANEL - Abnormal; Notable for the following:    Glucose, Bld 112 (*)    GFR calc non Af Amer 56 (*)    All other components within normal limits  CBG MONITORING, ED - Abnormal; Notable for the following:    Glucose-Capillary 102 (*)    All other components within normal limits  I-STAT CHEM 8, ED - Abnormal; Notable for the following:    Glucose, Bld 112 (*)    All other components within normal limits  PROTIME-INR  APTT  CBC  DIFFERENTIAL  I-STAT TROPOININ, ED    Imaging Review Ct Head Wo Contrast  10/17/2014   CLINICAL DATA:  Acute onset of left-sided weakness and unsteady gait. Initial encounter.  EXAM: CT HEAD WITHOUT CONTRAST  TECHNIQUE: Contiguous axial images were obtained from the base of the skull through the vertex without intravenous contrast.  COMPARISON:  CT of the head performed 02/02/2010  FINDINGS: There is no evidence of acute infarction, mass lesion, or intra- or extra-axial hemorrhage on CT.  A small chronic lacunar infarct is noted at the  posterior right corona radiata.  The posterior fossa, including the cerebellum, brainstem and fourth ventricle, is within normal limits. The third and lateral ventricles, and basal ganglia are unremarkable in appearance. The cerebral hemispheres demonstrate grossly normal gray-white differentiation. No mass effect or midline shift is seen.  There is no evidence of fracture; visualized osseous structures are unremarkable in appearance. Postoperative change is noted at both optic globes. The paranasal sinuses and mastoid air cells are well-aerated. No significant soft tissue abnormalities are seen.  IMPRESSION: 1. No acute intracranial pathology seen on CT. 2. Small chronic lacunar infarct at the posterior right corona radiata, better seen than in 2011.   Electronically Signed   By: Garald Balding M.D.   On: 10/17/2014 21:12   I, Charlesetta Shanks, personally reviewed and evaluated these  images and lab results as part of my medical decision-making.   EKG Interpretation   Date/Time:  Monday October 17 2014 18:08:50 EDT Ventricular Rate:  62 PR Interval:  241 QRS Duration: 144 QT Interval:  443 QTC Calculation: 450 R Axis:   -45 Text Interpretation:  Sinus rhythm Prolonged PR interval RBBB and LAFB LVH  by voltage agree. no sig change from old Confirmed by Johnney Killian, MD, Jeannie Done  [phone]) on 10/17/2014 6:25:51 PM     Consult: Neurology, Dr. Aram Beecham has evaluated the patient in the emergency department. At this time he advises for admission with OT PT and planned MRI tomorrow. MDM   Final diagnoses:  Neurologic gait dysfunction   Patient presents with acute onset of gait dysfunction. He has instability and fall risk. At this time the patient is alert and nontoxic. He'll be admitted for further diagnostic evaluation and PT/ OT assessment.    Charlesetta Shanks, MD 10/18/14 [phone]

## 2014-10-17 NOTE — ED Notes (Addendum)
Pt here  sts weakness on his left side that started Friday. sts was seen at [facility] then and worked up. Pt equal grip strength. Pt left facial droop and per family that is new.  Denies pain. Sts he fell yesterday. Per family left leg weak and couldn't hold foot up to put his shoe on.

## 2014-10-17 NOTE — ED Notes (Signed)
Patient transported to CT 

## 2014-10-17 NOTE — ED Notes (Signed)
CBG 102 

## 2014-10-18 ENCOUNTER — Inpatient Hospital Stay (HOSPITAL_COMMUNITY): Payer: Medicare Other

## 2014-10-18 ENCOUNTER — Emergency Department (HOSPITAL_COMMUNITY): Payer: Medicare Other

## 2014-10-18 ENCOUNTER — Encounter (HOSPITAL_COMMUNITY): Payer: Self-pay | Admitting: *Deleted

## 2014-10-18 DIAGNOSIS — I10 Essential (primary) hypertension: Secondary | ICD-10-CM | POA: Diagnosis present

## 2014-10-18 DIAGNOSIS — R482 Apraxia: Secondary | ICD-10-CM

## 2014-10-18 DIAGNOSIS — E785 Hyperlipidemia, unspecified: Secondary | ICD-10-CM | POA: Diagnosis present

## 2014-10-18 DIAGNOSIS — Z8673 Personal history of transient ischemic attack (TIA), and cerebral infarction without residual deficits: Secondary | ICD-10-CM | POA: Diagnosis present

## 2014-10-18 DIAGNOSIS — Z955 Presence of coronary angioplasty implant and graft: Secondary | ICD-10-CM

## 2014-10-18 DIAGNOSIS — E7800 Pure hypercholesterolemia, unspecified: Secondary | ICD-10-CM | POA: Diagnosis not present

## 2014-10-18 DIAGNOSIS — I63311 Cerebral infarction due to thrombosis of right middle cerebral artery: Secondary | ICD-10-CM | POA: Diagnosis present

## 2014-10-18 DIAGNOSIS — I639 Cerebral infarction, unspecified: Secondary | ICD-10-CM | POA: Insufficient documentation

## 2014-10-18 DIAGNOSIS — I739 Peripheral vascular disease, unspecified: Secondary | ICD-10-CM | POA: Diagnosis present

## 2014-10-18 DIAGNOSIS — E876 Hypokalemia: Secondary | ICD-10-CM | POA: Diagnosis present

## 2014-10-18 DIAGNOSIS — N400 Enlarged prostate without lower urinary tract symptoms: Secondary | ICD-10-CM | POA: Diagnosis present

## 2014-10-18 DIAGNOSIS — E119 Type 2 diabetes mellitus without complications: Secondary | ICD-10-CM

## 2014-10-18 DIAGNOSIS — E1159 Type 2 diabetes mellitus with other circulatory complications: Secondary | ICD-10-CM

## 2014-10-18 DIAGNOSIS — I6350 Cerebral infarction due to unspecified occlusion or stenosis of unspecified cerebral artery: Secondary | ICD-10-CM | POA: Diagnosis not present

## 2014-10-18 DIAGNOSIS — I6789 Other cerebrovascular disease: Secondary | ICD-10-CM | POA: Diagnosis not present

## 2014-10-18 DIAGNOSIS — Z7982 Long term (current) use of aspirin: Secondary | ICD-10-CM | POA: Diagnosis not present

## 2014-10-18 DIAGNOSIS — Z7902 Long term (current) use of antithrombotics/antiplatelets: Secondary | ICD-10-CM | POA: Diagnosis not present

## 2014-10-18 DIAGNOSIS — F0390 Unspecified dementia without behavioral disturbance: Secondary | ICD-10-CM | POA: Diagnosis present

## 2014-10-18 DIAGNOSIS — Z8546 Personal history of malignant neoplasm of prostate: Secondary | ICD-10-CM | POA: Diagnosis not present

## 2014-10-18 DIAGNOSIS — I2510 Atherosclerotic heart disease of native coronary artery without angina pectoris: Secondary | ICD-10-CM | POA: Diagnosis present

## 2014-10-18 DIAGNOSIS — R269 Unspecified abnormalities of gait and mobility: Secondary | ICD-10-CM | POA: Diagnosis present

## 2014-10-18 DIAGNOSIS — Z9079 Acquired absence of other genital organ(s): Secondary | ICD-10-CM | POA: Diagnosis present

## 2014-10-18 DIAGNOSIS — Z79899 Other long term (current) drug therapy: Secondary | ICD-10-CM | POA: Diagnosis not present

## 2014-10-18 DIAGNOSIS — Z791 Long term (current) use of non-steroidal anti-inflammatories (NSAID): Secondary | ICD-10-CM | POA: Diagnosis not present

## 2014-10-18 DIAGNOSIS — H5442 Blindness, left eye, normal vision right eye: Secondary | ICD-10-CM | POA: Diagnosis present

## 2014-10-18 LAB — BASIC METABOLIC PANEL
ANION GAP: 9 (ref 5–15)
BUN: 11 mg/dL (ref 6–20)
CALCIUM: 9.2 mg/dL (ref 8.9–10.3)
CO2: 25 mmol/L (ref 22–32)
Chloride: 104 mmol/L (ref 101–111)
Creatinine, Ser: 1.19 mg/dL (ref 0.61–1.24)
GFR, EST NON AFRICAN AMERICAN: 55 mL/min — AB (ref >60)
GLUCOSE: 125 mg/dL — AB (ref 65–99)
POTASSIUM: 3.4 mmol/L — AB (ref 3.5–5.1)
Sodium: 138 mmol/L (ref 135–145)

## 2014-10-18 LAB — CBC
HEMATOCRIT: 42.5 % (ref 39.0–52.0)
HEMOGLOBIN: 14.2 g/dL (ref 13.0–17.0)
MCH: 28.4 pg (ref 26.0–34.0)
MCHC: 33.4 g/dL (ref 30.0–36.0)
MCV: 85 fL (ref 78.0–100.0)
Platelets: 202 10*3/uL (ref 150–400)
RBC: 5 MIL/uL (ref 4.22–5.81)
RDW: 14.8 % (ref 11.5–15.5)
WBC: 5.3 10*3/uL (ref 4.0–10.5)

## 2014-10-18 LAB — GLUCOSE, CAPILLARY
GLUCOSE-CAPILLARY: 125 mg/dL — AB (ref 65–99)
Glucose-Capillary: 123 mg/dL — ABNORMAL HIGH (ref 65–99)
Glucose-Capillary: 130 mg/dL — ABNORMAL HIGH (ref 65–99)
Glucose-Capillary: 90 mg/dL (ref 65–99)

## 2014-10-18 LAB — TSH: TSH: 6.172 u[IU]/mL — ABNORMAL HIGH (ref 0.350–4.500)

## 2014-10-18 MED ORDER — DORZOLAMIDE HCL-TIMOLOL MAL 2-0.5 % OP SOLN
2.0000 [drp] | Freq: Two times a day (BID) | OPHTHALMIC | Status: DC
  Administered 2014-10-18 – 2014-10-20 (×5): 2 [drp] via OPHTHALMIC
  Filled 2014-10-18 (×2): qty 10

## 2014-10-18 MED ORDER — ALUM & MAG HYDROXIDE-SIMETH 200-200-20 MG/5ML PO SUSP: 30.0000 mL | Freq: Four times a day (QID) | ORAL | Status: DC | PRN

## 2014-10-18 MED ORDER — LORAZEPAM 2 MG/ML IJ SOLN
1.0000 mg | Freq: Once | INTRAMUSCULAR | Status: AC
  Administered 2014-10-18: 1 mg via INTRAVENOUS
  Filled 2014-10-18: qty 1

## 2014-10-18 MED ORDER — AMLODIPINE BESYLATE 5 MG PO TABS
5.0000 mg | ORAL_TABLET | Freq: Every day | ORAL | Status: DC
  Administered 2014-10-18 – 2014-10-20 (×3): 5 mg via ORAL
  Filled 2014-10-18 (×3): qty 1

## 2014-10-18 MED ORDER — ACETAMINOPHEN 650 MG RE SUPP: 650.0000 mg | Freq: Four times a day (QID) | RECTAL | Status: DC | PRN

## 2014-10-18 MED ORDER — SODIUM CHLORIDE 0.9 % IJ SOLN
3.0000 mL | Freq: Two times a day (BID) | INTRAMUSCULAR | Status: DC
  Administered 2014-10-18 – 2014-10-19 (×4): 3 mL via INTRAVENOUS

## 2014-10-18 MED ORDER — ONDANSETRON HCL 4 MG PO TABS: 4.0000 mg | ORAL_TABLET | Freq: Four times a day (QID) | ORAL | Status: DC | PRN

## 2014-10-18 MED ORDER — ALLOPURINOL 100 MG PO TABS
200.0000 mg | ORAL_TABLET | Freq: Every day | ORAL | Status: DC
  Administered 2014-10-18 – 2014-10-20 (×3): 200 mg via ORAL
  Filled 2014-10-18 (×3): qty 2

## 2014-10-18 MED ORDER — POLYETHYLENE GLYCOL 3350 17 G PO PACK: 17.0000 g | PACK | Freq: Every day | ORAL | Status: DC | PRN

## 2014-10-18 MED ORDER — POLYVINYL ALCOHOL 1.4 % OP SOLN: 1.0000 [drp] | Freq: Three times a day (TID) | OPHTHALMIC | Status: DC | PRN

## 2014-10-18 MED ORDER — ENOXAPARIN SODIUM 40 MG/0.4ML ~~LOC~~ SOLN
40.0000 mg | SUBCUTANEOUS | Status: DC
  Administered 2014-10-18 – 2014-10-19 (×2): 40 mg via SUBCUTANEOUS
  Filled 2014-10-18 (×2): qty 0.4

## 2014-10-18 MED ORDER — CALCIUM CARBONATE-VITAMIN D 500-200 MG-UNIT PO TABS
1.0000 | ORAL_TABLET | Freq: Two times a day (BID) | ORAL | Status: DC
  Administered 2014-10-18 – 2014-10-20 (×5): 1 via ORAL
  Filled 2014-10-18 (×5): qty 1

## 2014-10-18 MED ORDER — CALCIUM CARBONATE-VITAMIN D 600-400 MG-UNIT PO TABS: 1.0000 | ORAL_TABLET | Freq: Two times a day (BID) | ORAL | Status: DC

## 2014-10-18 MED ORDER — CLOPIDOGREL BISULFATE 75 MG PO TABS
75.0000 mg | ORAL_TABLET | Freq: Every day | ORAL | Status: DC
  Administered 2014-10-18 – 2014-10-20 (×3): 75 mg via ORAL
  Filled 2014-10-18 (×3): qty 1

## 2014-10-18 MED ORDER — TAMSULOSIN HCL 0.4 MG PO CAPS
0.4000 mg | ORAL_CAPSULE | Freq: Every day | ORAL | Status: DC
  Administered 2014-10-18 – 2014-10-19 (×2): 0.4 mg via ORAL
  Filled 2014-10-18 (×2): qty 1

## 2014-10-18 MED ORDER — ONDANSETRON HCL 4 MG/2ML IJ SOLN: 4.0000 mg | Freq: Four times a day (QID) | INTRAMUSCULAR | Status: DC | PRN

## 2014-10-18 MED ORDER — POTASSIUM CHLORIDE CRYS ER 20 MEQ PO TBCR
40.0000 meq | EXTENDED_RELEASE_TABLET | Freq: Once | ORAL | Status: AC
  Administered 2014-10-18: 40 meq via ORAL
  Filled 2014-10-18: qty 2

## 2014-10-18 MED ORDER — ASPIRIN EC 81 MG PO TBEC
81.0000 mg | DELAYED_RELEASE_TABLET | Freq: Every day | ORAL | Status: DC
  Administered 2014-10-18 – 2014-10-20 (×3): 81 mg via ORAL
  Filled 2014-10-18 (×3): qty 1

## 2014-10-18 MED ORDER — SODIUM CHLORIDE 0.45 % IV SOLN
INTRAVENOUS | Status: DC
  Administered 2014-10-18: 06:00:00 via INTRAVENOUS

## 2014-10-18 MED ORDER — ACETAMINOPHEN 325 MG PO TABS
650.0000 mg | ORAL_TABLET | Freq: Four times a day (QID) | ORAL | Status: DC | PRN
  Administered 2014-10-19 – 2014-10-20 (×2): 650 mg via ORAL
  Filled 2014-10-18 (×2): qty 2

## 2014-10-18 MED ORDER — ASPIRIN EC 81 MG PO TBEC: 81.0000 mg | DELAYED_RELEASE_TABLET | Freq: Every day | ORAL | Status: DC

## 2014-10-18 MED ORDER — BRIMONIDINE TARTRATE 0.2 % OP SOLN
1.0000 [drp] | Freq: Three times a day (TID) | OPHTHALMIC | Status: DC
  Administered 2014-10-19 – 2014-10-20 (×4): 1 [drp] via OPHTHALMIC
  Filled 2014-10-18: qty 5

## 2014-10-18 MED ORDER — PREDNISOLONE ACETATE 1 % OP SUSP
1.0000 [drp] | Freq: Every day | OPHTHALMIC | Status: DC
  Administered 2014-10-18 – 2014-10-19 (×2): 1 [drp] via OPHTHALMIC
  Filled 2014-10-18 (×2): qty 1

## 2014-10-18 MED ORDER — ATORVASTATIN CALCIUM 20 MG PO TABS
20.0000 mg | ORAL_TABLET | Freq: Every day | ORAL | Status: DC
  Administered 2014-10-18 – 2014-10-19 (×2): 20 mg via ORAL
  Filled 2014-10-18 (×2): qty 1

## 2014-10-18 MED ORDER — VITAMIN D3 25 MCG (1000 UNIT) PO TABS
1000.0000 [IU] | ORAL_TABLET | Freq: Every day | ORAL | Status: DC
  Administered 2014-10-18 – 2014-10-20 (×3): 1000 [IU] via ORAL
  Filled 2014-10-18 (×6): qty 1

## 2014-10-18 NOTE — Progress Notes (Signed)
STROKE TEAM PROGRESS NOTE   SUBJECTIVE (INTERVAL HISTORY) No family is at the bedside.  Overall he feels his condition is gradually improving. Still has mild left lower extremity weakness but much improved. Passed swallow screen, asking for breakfast.  OBJECTIVE Temp:  [97.7 F (36.5 C)-98.6 F (37 C)] 98.6 F (37 C) (08/16 1627) Pulse Rate:  [50-65] 56 (08/16 0517) Cardiac Rhythm:  [-]  Resp:  [10-24] 17 (08/16 0517) BP: (115-218)/(64-97) 119/71 mmHg (08/16 1030) SpO2:  [89 %-99 %] 99 % (08/16 0517) Weight:  [217 lb 4.8 oz (98.567 kg)] 217 lb 4.8 oz (98.567 kg) (08/16 0517)   Recent Labs Lab 10/17/14 1819 10/18/14 0723 10/18/14 1119 10/18/14 1628  GLUCAP 102* 125* 123* 130*    Recent Labs Lab 10/14/14 0745 10/17/14 1753 10/17/14 1811 10/18/14 0610  NA 137 138 141 138  K 3.0* 3.9 3.9 3.4*  CL 104 102 102 104  CO2 23 27  --  25  GLUCOSE 154* 112* 112* 125*  BUN 10 11 14 11   CREATININE 1.09 1.17 1.00 1.19  CALCIUM 9.5 10.1  --  9.2    Recent Labs Lab 10/14/14 0745 10/17/14 1753  AST 18 22  ALT 15* 17  ALKPHOS 62 67  BILITOT 0.8 0.5  PROT 7.8 8.0  ALBUMIN 3.9 4.0    Recent Labs Lab 10/14/14 0745 10/17/14 1753 10/17/14 1811 10/18/14 0610  WBC 5.6 5.8  --  5.3  NEUTROABS 3.7 3.4  --   --   HGB 13.9 14.8 17.0 14.2  HCT 41.1 43.4 50.0 42.5  MCV 85.1 84.9  --  85.0  PLT 201 242  --  202    Recent Labs Lab 10/14/14 0745  TROPONINI <0.03    Recent Labs  10/17/14 1753  LABPROT 14.0  INR 1.06   No results for input(s): COLORURINE, LABSPEC, PHURINE, GLUCOSEU, HGBUR, BILIRUBINUR, KETONESUR, PROTEINUR, UROBILINOGEN, NITRITE, LEUKOCYTESUR in the last 72 hours.  Invalid input(s): APPERANCEUR  No results found for: CHOL, TRIG, HDL, CHOLHDL, VLDL, LDLCALC No results found for: HGBA1C No results found for: LABOPIA, COCAINSCRNUR, LABBENZ, AMPHETMU, THCU, LABBARB  No results for input(s): ETH in the last 168 hours.  I have personally reviewed the  radiological images below and agree with the radiology interpretations.  Dg Chest 2 View 10/14/2014   IMPRESSION: No acute cardiopulmonary disease.      Ct Head Wo Contrast 10/17/2014   IMPRESSION: 1. No acute intracranial pathology seen on CT. 2. Small chronic lacunar infarct at the posterior right corona radiata, better seen than in 2011.      Mr Brain Wo Contrast 10/18/2014    Patchy area of acute ischemia RIGHT corona radiata extending into the posterior limb of the RIGHT internal capsule.  Otherwise unremarkable MRI of the brain for age.    Mr Cervical Spine Wo Contrast Motion degraded examination. No acute fracture or malalignment.  Degenerative change of the cervical spine. Mild to moderate canal stenosis C4-5, mild at C3-4 and C5-6.  Neural foraminal narrowing C3-4 thru C6-7: Severe bilaterally at C3-4 and C5-6.    Mr Thoracic Spine Wo Contrast 10/14/2014   No thoracic spinal stenosis or acute abnormality. Mildly exaggerated thoracic kyphosis.   Mr Lumbar Spine Wo Contrast 1. Heterogeneous marrow with through the thoracolumbar spine. In this patient with presumed history of prostate cancer and prostatectomy, correlation with PSA is recommended. 2. The congenitally narrow spinal canal with superimposed degenerative disease. Posterior epidural lipomatosis. 3. Patient refused further imaging and only sagittal  T1 and inversion recovery images were obtained. 4. Moderate lumbar spondylosis for age.  Carotid Doppler  - Normal LV size with mild LV hypertrophy. EF 55%. Mildly dilated RV with mildly decreased systolic function. Aortic sclerosis without significant stenosis.  2D Echocardiogram  pending  PHYSICAL EXAM  Temp:  [97.7 F (36.5 C)-98.6 F (37 C)] 98.6 F (37 C) (08/16 1627) Pulse Rate:  [50-65] 56 (08/16 0517) Resp:  [10-24] 17 (08/16 0517) BP: (115-218)/(64-97) 119/71 mmHg (08/16 1030) SpO2:  [89 %-99 %] 99 % (08/16 0517) Weight:  [217 lb 4.8 oz (98.567 kg)] 217 lb 4.8  oz (98.567 kg) (08/16 0517)  General - Well nourished, well developed, in no apparent distress.  Ophthalmologic - fundi not visualized due to eye movement.  Cardiovascular - Regular rate and rhythm.  Mental Status -  Level of arousal and orientation to time, place, and person were intact. Language including expression, naming, repetition, comprehension was assessed and found intact, but mild dysarthria. Fund of Knowledge was assessed and was intact.  Cranial Nerves II - XII - II - left eye light perception, right eye counting fingers, s/p b/l glaucoma surgery. III, IV, VI - bilateral surgical pupils, bilateral lateral gaze not complete. V - Facial sensation intact bilaterally. VII - left nasolabial fold flattening. VIII - Hearing & vestibular intact bilaterally. X - Palate elevates symmetrically. XI - Chin turning & shoulder shrug intact bilaterally. XII - Tongue protrusion intact.  Motor Strength - The patient's strength was normal in all extremities except LLE 4+/5 proximal and distal and pronator drift was absent.  Bulk was normal and fasciculations were absent.   Motor Tone - Muscle tone was assessed at the neck and appendages and was normal.  Reflexes - The patient's reflexes were symmetrical in all extremities and he had no pathological reflexes.  Sensory - Light touch, temperature/pinprick were assessed and were symmetrical.    Coordination - The patient had normal movements in the handswith no ataxia or dysmetria.  Tremor was absent.  Gait and Station - not tested due to safety concerns.   ASSESSMENT/PLAN Mr. JAHREE DERMODY is a 81 y.o. male with history of cognitive impairment, HTN, DM, HLD, CAD status post stent, prostate cancer status post surgery admitted for left lower extremity weakness and off balance for 3 days. Symptoms gradually improving.    Stroke:  right corona radiata small infarct likely secondary to small vessel disease source  MRI  Right CR small  infarct  Carotid Doppler  pending  2D Echo  unremarkable  LDL pending  HgbA1c pending  Lovenox for VTE prophylaxis  Diet Heart Room service appropriate?: Yes; Fluid consistency:: Thin   aspirin 81 mg orally every day and clopidogrel 75 mg orally every day prior to admission, now on aspirin 81 mg orally every day and clopidogrel 75 mg orally every day. Continued dural antiplatelet for stroke and cardiac prevention.  Patient counseled to be compliant with his antithrombotic medications  Ongoing aggressive stroke risk factor management  Therapy recommendations:  Pending  Disposition:  Pending  Diabetes  HgbA1c pending goal < 7.0  Controlled  CBG monitoring  SSI  DM education  Hypertension  Home meds:   Amlodipine Permissive hypertension (OK if <220/120) for 24-48 hours post stroke and then gradually normalized within 5-7 days. Currently on amlodipine  Stable  Patient counseled to be compliant with his blood pressure medications  Hyperlipidemia  Home meds:  Lipitor 20   Currently on Lipitor 20  LDL pending, goal <  72  Continue statin at discharge  Other Stroke Risk Factors  Advanced age  Coronary artery disease status post stent  Other Active Problems  Cognitive impairment  Hypokalemia  Elevated TSH - repeat TSH, check free T4 and T3  Other Pertinent History  Bilateral glaucoma surgery  Left eye legally blind  Hospital day # 0  Rosalin Hawking, MD PhD Stroke Neurology 10/18/2014 5:58 PM    To contact Stroke Continuity provider, please refer to [email or web address] After hours, contact General Neurology

## 2014-10-18 NOTE — ED Notes (Signed)
MD at bedside watching this RN and Chester Holstein, RN ambulate pt in hallway.

## 2014-10-18 NOTE — Care Management Note (Addendum)
Case Management Note  Patient Details  Name: PIOTR CHRISTOPHER MRN: [id number] Date of Birth: [birth year]  Subjective/Objective: Pt admitted for leg weakness and facial droop. MRI positive.                    Action/Plan: CM did cal the [facility] to make them aware that pt was admitted to hospital. CM to continue to monitor for disposition needs.   Expected Discharge Date:  10/20/14               Expected Discharge Plan:  [place]  In-House Referral:     Discharge planning Services  CM Consult  Post Acute Care Choice:    Choice offered to:     DME Arranged:    DME Agency:     HH Arranged:    HH Agency:     Status of Service:  In process, will continue to follow  Medicare Important Message Given:    Date Medicare IM Given:    Medicare IM give by:    Date Additional Medicare IM Given:    Additional Medicare Important Message give by:     If discussed at [place] of Stay Meetings, dates discussed:    Additional Comments:  Bethena Roys, RN 10/18/2014, 2:46 PM

## 2014-10-18 NOTE — H&P (Signed)
[facility] History and Physical  KAELUM KISSICK MEQ:[id number] DOB: [birth year] DOA: 10/17/2014  Referring physician: Dr [facility] ED physician PCP: Elizabeth Palau, MD    Chief Complaint: Leg weakness, facial drooping  HPI: Trevor Andrews is a 81 y.o. male with hx of HTN, DM, HL, CAD w hx stent, prostate cancer s/p prostatectomy, cognitive impairment. Came to ED for eval of LLE weakness and falls. Patient was seen at [place] on 8/12 which is 3 days ago due to complaints about acute bilat LE weakness.  Had MRI T-spine which was negative for cord lesions, unable to complete MRI of L spine due to claustrophobia. [place] home. Now he is saying that his left LE feels weak, "I am not able to use it as before".  Family says his face has been "dropping" also.  Seen by neurology, possible gait apraxia. They recommended MRI brain and C-spine for LE weakness bilat.   Patient denies any hx CVA, no voiding issues, multiple chronic joint pains, no rash, no n/v/d, eating ok, hx stent in his heart, no cough/ sob/ smoking. No HA, blurred vision.    Chart review: May 2012 > chest pain , hx HTN, HL, DJD; ruled out for MI, Cardiolite was negative, EF 57%. ECHO possible pulm HTN. Bradycardia, stable.   Where does patient live home w wife and daughter Can patient participate in ADLs? yes  Past Medical History  Past Medical History  Diagnosis Date  . Hypertension   . Hypercholesteremia   . Arthritis   . Dementia   . Diabetes mellitus without complication   . Cancer    Past Surgical History  Past Surgical History  Procedure Laterality Date  . Prostatectomy    . Cataract extraction  2011 and 2012    2011lt eye and rt eye 2012  . Glauma implant  2012    rt.eye  . Coronary angioplasty with stent placement     Family History History reviewed. No pertinent family history.  Social History  reports that he has never smoked. He has never used smokeless tobacco. He reports that he drinks about 1.2 oz of alcohol  per week. He reports that he does not use illicit drugs. Allergies No Known Allergies Home medications Prior to Admission medications   Medication Sig Start Date End Date Taking? Authorizing Provider  acetaminophen (TYLENOL) 500 MG tablet Take 1,000 mg by mouth every 6 (six) hours as needed (for pain).    Yes Historical Provider, MD  allopurinol (ZYLOPRIM) 100 MG tablet Take 200 mg by mouth daily.    Yes Historical Provider, MD  amLODipine (NORVASC) 10 MG tablet Take 5 mg by mouth daily.    Yes Historical Provider, MD  aspirin EC 81 MG tablet Take 81 mg by mouth daily.   Yes Historical Provider, MD  atorvastatin (LIPITOR) 40 MG tablet Take 20 mg by mouth at bedtime.   Yes Historical Provider, MD  Calcium Carbonate-Vitamin D (CALCIUM 600+D) 600-400 MG-UNIT per tablet Take 1 tablet by mouth 2 (two) times daily.   Yes Historical Provider, MD  cholecalciferol (VITAMIN D) 1000 UNITS tablet Take 1,000 Units by mouth daily.   Yes Historical Provider, MD  clopidogrel (PLAVIX) 75 MG tablet Take 75 mg by mouth daily.   Yes Historical Provider, MD  dorzolamide-timolol (COSOPT) 22.3-6.8 MG/ML ophthalmic solution Place 2 drops into both eyes Twice daily. 04/21/11  Yes Historical Provider, MD  polyvinyl alcohol (LIQUIFILM TEARS) 1.4 % ophthalmic solution Place 1 drop into both eyes 3 (three) times  daily as needed for dry eyes.   Yes Historical Provider, MD  prednisoLONE acetate (PRED FORTE) 1 % ophthalmic suspension Place 1 drop into the right eye daily.  04/12/11  Yes Historical Provider, MD  tamsulosin (FLOMAX) 0.4 MG CAPS capsule Take 0.4 mg by mouth at bedtime.   Yes Historical Provider, MD   Liver Function Tests  Recent Labs Lab 10/14/14 0745 10/17/14 1753  AST 18 22  ALT 15* 17  ALKPHOS 62 67  BILITOT 0.8 0.5  PROT 7.8 8.0  ALBUMIN 3.9 4.0   No results for input(s): LIPASE, AMYLASE in the last 168 hours. CBC  Recent Labs Lab 10/14/14 0745 10/17/14 1753 10/17/14 1811  WBC 5.6 5.8  --    NEUTROABS 3.7 3.4  --   HGB 13.9 14.8 17.0  HCT 41.1 43.4 50.0  MCV 85.1 84.9  --   PLT 201 242  --    Basic Metabolic Panel  Recent Labs Lab 10/14/14 0745 10/17/14 1753 10/17/14 1811  NA 137 138 141  K 3.0* 3.9 3.9  CL 104 102 102  CO2 23 27  --   GLUCOSE 154* 112* 112*  BUN 10 11 14   CREATININE 1.09 1.17 1.00  CALCIUM 9.5 10.1  --     CXR: independently reviewed > no acute disease, no edema or infiltrates    Filed Vitals:   10/18/14 0030 10/18/14 0045 10/18/14 0100 10/18/14 0130  BP: 156/75 171/81 158/64 147/72  Pulse: 56 65 61 62  Temp:      TempSrc:      Resp: 14 14 21 20   Height:      Weight:      SpO2: 95% 95% 95% 87%   Exam: Elderly BM, no distress, calm No rash, cyanosis or gangrene Sclera anicteric, throat clear No jvd or bruits, no nodes Chest clear bilat no rales or wheezing RRR no MRG Abd soft ntnd no mass, no hsm no ascites GU normal male, no hernias Ext no joint effusions, no LE edema, no wounds or ulcers Neuro bilat LE weakness , SLR 2-3+ on R and 1-2+ on L Plantar flexion 4/5 R and 4-/5 L, dorsiflexion same Sensation intact LE's and UE's   Na 141 K 3.9  BUN 14  Creat 1.00  Glu 112  Ca 10.1 WBC 5k  Hb 14.8  plts 242 CXR no acute    Assessment: 1. Gait abnormality - in patient with some dementia/ cognitive decline (not severe). Seen in ED on 8/12 for same with negative T-spine MRI.  Seen by neurology, possible gait apraxia. For MRI brain and C-spine.  2. Hypertension 3. Hyperlipidemia 4. Hx CAD / stent placement 5. Hx prostate cancer, s/p prostatectomy  Plan - as above   DVT Prophylaxis SCD's until MRI is back  Code Status: full  Family Communication: no family here now  Disposition Plan: dc when stable    [place] D [facility] Pager [phone]  If 7PM-7AM, please contact night-coverage [email or web address] Password [email or web address] 10/18/2014, 1:50 AM

## 2014-10-18 NOTE — Progress Notes (Addendum)
[facility] PROGRESS NOTE  Trevor Andrews ZOX:[id number] DOB: 01-29-1930 DOA: 10/17/2014 PCP: Elizabeth Palau, MD  Brief narrative 81 year old male, legally blind, with multiple medical problems including hypertension, diabetes mellitus, hyperlipidemia, coronary artery disease with history of stent, prostate cancer status post prostatectomy, mild dementia presented to the ED for left lower leg weakness and falls. He was seen in the lung ED on 8/12 due to acute bilateral lower extremity weakness for which he had MRI of the thoracic spine which was negative (unable to complete lumbar spine due to claustrophobia) and was discharged home. Head CT on admission was unremarkable however MRI of the brain showed acute infarct lower right posterior corona radiata.   Assessment/Plan: Acute right ischemic stroke Patchy area of infarct over right coronary data extending to the posterior limb of the right internal capsule on MRI, which explains his left-sided weakness. Patient on both baby aspirin and Plavix. May need full dose aspirin. Check lipid panel. Allow permissive blood pressure. Check 2-D echo and carotid Doppler. -PT/OT evaluation. -Neurology following.  Coronary artery disease with stent placement Continue aspirin and Lipitor.   Diabetes mellitus without complications Monitor on sliding scale insulin.  Dementia Mild admitted impairment.  Hypokalemia  replenished  BPH Continue Flomax  DVT prophylaxis: Subcutaneous Lovenox  Diet: Heart healthy  Code Status: full code Family Communication: none at bedside Disposition Plan: currently inpt. May need SNF   Consultants:  Neurology  Procedures:  MRI brain/ cervical spine   2 echo  Carotid doppler  Antibiotics:  none  HPI/Subjective: Seen and examined.  H&P reviewed.  feels his left-sided weakness has improved since admission  Objective: Filed Vitals:   10/18/14 1117  BP:   Pulse:   Temp: 98 F (36.7 C)   Resp:     Intake/Output Summary (Last 24 hours) at 10/18/14 1305 Last data filed at 10/18/14 1225  Gross per 24 hour  Intake    240 ml  Output    300 ml  Net    -60 ml   Filed Weights   10/17/14 1736 10/18/14 0517  Weight: 105.688 kg (233 lb) 98.567 kg (217 lb 4.8 oz)    Exam:   General: Elderly male lying in bed not in distress, has mild facial droop  HEENT: No pallor, moist oral mucosa, supple neck  Chest: Clear to auscultation bilaterally  CVS: Normal S1 and S2, no murmurs  GI: Soft, nondistended, nontender, bowel sounds present  Musculoskeletal: Warm, no edema  CNS: Alert and oriented, 4+/ 5 power over left lower extremity    Data Reviewed: Basic Metabolic Panel:  Recent Labs Lab 10/14/14 0745 10/17/14 1753 10/17/14 1811 10/18/14 0610  NA 137 138 141 138  K 3.0* 3.9 3.9 3.4*  CL 104 102 102 104  CO2 23 27  --  25  GLUCOSE 154* 112* 112* 125*  BUN 10 11 14 11   CREATININE 1.09 1.17 1.00 1.19  CALCIUM 9.5 10.1  --  9.2   Liver Function Tests:  Recent Labs Lab 10/14/14 0745 10/17/14 1753  AST 18 22  ALT 15* 17  ALKPHOS 62 67  BILITOT 0.8 0.5  PROT 7.8 8.0  ALBUMIN 3.9 4.0   No results for input(s): LIPASE, AMYLASE in the last 168 hours. No results for input(s): AMMONIA in the last 168 hours. CBC:  Recent Labs Lab 10/14/14 0745 10/17/14 1753 10/17/14 1811 10/18/14 0610  WBC 5.6 5.8  --  5.3  NEUTROABS 3.7 3.4  --   --   HGB  13.9 14.8 17.0 14.2  HCT 41.1 43.4 50.0 42.5  MCV 85.1 84.9  --  85.0  PLT 201 242  --  202   Cardiac Enzymes:  Recent Labs Lab 10/14/14 0745  TROPONINI <0.03   BNP (last 3 results) No results for input(s): BNP in the last 8760 hours.  ProBNP (last 3 results) No results for input(s): PROBNP in the last 8760 hours.  CBG:  Recent Labs Lab 10/17/14 1819 10/18/14 0723  GLUCAP 102* 125*    Recent Results (from the past 240 hour(s))  Urine culture     Status: None   Collection Time: 10/14/14  9:03  AM  Result Value Ref Range Status   Specimen Description URINE, CLEAN CATCH  Final   Special Requests NONE  Final   Culture   Final    7,000 COLONIES/mL INSIGNIFICANT GROWTH Performed at [facility]    Report Status 10/15/2014 FINAL  Final     Studies: Ct Head Wo Contrast  10/17/2014   CLINICAL DATA:  Acute onset of left-sided weakness and unsteady gait. Initial encounter.  EXAM: CT HEAD WITHOUT CONTRAST  TECHNIQUE: Contiguous axial images were obtained from the base of the skull through the vertex without intravenous contrast.  COMPARISON:  CT of the head performed 02/02/2010  FINDINGS: There is no evidence of acute infarction, mass lesion, or intra- or extra-axial hemorrhage on CT.  A small chronic lacunar infarct is noted at the posterior right corona radiata.  The posterior fossa, including the cerebellum, brainstem and fourth ventricle, is within normal limits. The third and lateral ventricles, and basal ganglia are unremarkable in appearance. The cerebral hemispheres demonstrate grossly normal gray-white differentiation. No mass effect or midline shift is seen.  There is no evidence of fracture; visualized osseous structures are unremarkable in appearance. Postoperative change is noted at both optic globes. The paranasal sinuses and mastoid air cells are well-aerated. No significant soft tissue abnormalities are seen.  IMPRESSION: 1. No acute intracranial pathology seen on CT. 2. Small chronic lacunar infarct at the posterior right corona radiata, better seen than in 2011.   Electronically Signed   By: Garald Balding M.D.   On: 10/17/2014 21:12   Mr Brain Wo Contrast  10/18/2014   CLINICAL DATA:  Acute onset bilateral lower extremity weakness October 14, 2014. Symptoms recurring today with leg weakness. History of hypertension, dementia, diabetes, cancer.  EXAM: MRI HEAD WITHOUT CONTRAST  MRI CERVICAL SPINE WITHOUT CONTRAST  TECHNIQUE: Multiplanar, multiecho pulse sequences of the brain  and surrounding structures, and cervical spine, to include the craniocervical junction and cervicothoracic junction, were obtained without intravenous contrast.  COMPARISON:  None.  CT of the head October 17, 2014  FINDINGS: MRI HEAD FINDINGS  Multiple small foci of confluent acute ischemia, measuring 23 x 18 mm within RIGHT corona radiata, extending into the posterior limb of the RIGHT internal capsule. Corresponding slightly decreased ADC values and, FLAIR T2 hyperintense signal. No susceptibility artifact to suggest hemorrhage. Ventricles and sulci are normal for patient's age. Minimal white matter T2 hyperintensities exclusive of the aforementioned abnormality consistent with chronic small vessel ischemic disease, less than expected for age. No midline shift, mass effect or mass lesions.  No abnormal extra-axial fluid collections. Status post bilateral ocular lens implants and, RIGHT scleral banding. Trace paranasal sinus mucosal thickening without air-fluid levels. The mastoid air cells are well aerated. No abnormal sellar expansion. No cerebellar tonsillar ectopia. No suspicious calvarial bone marrow signal.  MR CERVICAL SPINE FINDINGS  Cervical vertebral bodies intact and aligned and maintenance of cervical lordosis. Mild to moderate C5-6 disc height loss, mild at C3-4. Decreased T2 signal within all cervical disc consistent with moderate desiccation. Moderate chronic discogenic endplate changes J1-9 and C6-7, mild at C3-4. No STIR signal abnormality to suggest acute osseous process.  Cervical spinal cord is normal morphology and signal characteristics from the cervical medullary junction to level of T3-4, the most caudal well visualized level. Craniocervical junction is intact. Mild symmetric paraspinal muscle atrophy.  Level by level evaluation (moderately motion degraded axial T2):  C2-3: No disc bulge. Moderate LEFT facet arthropathy. No canal stenosis or neural foraminal narrowing.  C3-4: Small  broad-based disc bulge/central disc protrusion, uncovertebral hypertrophy. Mild to moderate bilateral facet arthropathy. Mild canal stenosis. Severe bilateral neural foraminal narrowing.  C4-5: Approximate 3 mm RIGHT central disc protrusion deforms the ventral spinal cord. Uncovertebral hypertrophy and mild facet arthropathy. Mild to moderate canal stenosis. Moderate RIGHT greater than LEFT neural foraminal narrowing.  C5-6: Small broad-based disc bulge and tiny RIGHT central disc protrusion contacts the ventral spinal cord. Uncovertebral hypertrophy. Mild canal stenosis. Severe bilateral neural foraminal narrowing.  C6-7: Small broad-based disc bulge, uncovertebral hypertrophy. No canal stenosis. Mild facet arthropathy. Mild bilateral neural foraminal narrowing.  C7-T1: No disc bulge, canal stenosis nor neural foraminal narrowing. Mild facet arthropathy.  IMPRESSION: MRI HEAD: Patchy area of acute ischemia RIGHT corona radiata extending into the posterior limb of the RIGHT internal capsule.  Otherwise unremarkable MRI of the brain for age.  MR CERVICAL SPINE: Motion degraded examination. No acute fracture or malalignment.  Degenerative change of the cervical spine. Mild to moderate canal stenosis C4-5, mild at C3-4 and C5-6.  Neural foraminal narrowing C3-4 thru C6-7: Severe bilaterally at C3-4 and C5-6.   Electronically Signed   By: Elon Alas M.D.   On: 10/18/2014 03:17   Mr Cervical Spine Wo Contrast  10/18/2014   CLINICAL DATA:  Acute onset bilateral lower extremity weakness October 14, 2014. Symptoms recurring today with leg weakness. History of hypertension, dementia, diabetes, cancer.  EXAM: MRI HEAD WITHOUT CONTRAST  MRI CERVICAL SPINE WITHOUT CONTRAST  TECHNIQUE: Multiplanar, multiecho pulse sequences of the brain and surrounding structures, and cervical spine, to include the craniocervical junction and cervicothoracic junction, were obtained without intravenous contrast.  COMPARISON:  None.  CT  of the head October 17, 2014  FINDINGS: MRI HEAD FINDINGS  Multiple small foci of confluent acute ischemia, measuring 23 x 18 mm within RIGHT corona radiata, extending into the posterior limb of the RIGHT internal capsule. Corresponding slightly decreased ADC values and, FLAIR T2 hyperintense signal. No susceptibility artifact to suggest hemorrhage. Ventricles and sulci are normal for patient's age. Minimal white matter T2 hyperintensities exclusive of the aforementioned abnormality consistent with chronic small vessel ischemic disease, less than expected for age. No midline shift, mass effect or mass lesions.  No abnormal extra-axial fluid collections. Status post bilateral ocular lens implants and, RIGHT scleral banding. Trace paranasal sinus mucosal thickening without air-fluid levels. The mastoid air cells are well aerated. No abnormal sellar expansion. No cerebellar tonsillar ectopia. No suspicious calvarial bone marrow signal.  MR CERVICAL SPINE FINDINGS  Cervical vertebral bodies intact and aligned and maintenance of cervical lordosis. Mild to moderate C5-6 disc height loss, mild at C3-4. Decreased T2 signal within all cervical disc consistent with moderate desiccation. Moderate chronic discogenic endplate changes E1-7 and C6-7, mild at C3-4. No STIR signal abnormality to suggest acute osseous process.  Cervical spinal  cord is normal morphology and signal characteristics from the cervical medullary junction to level of T3-4, the most caudal well visualized level. Craniocervical junction is intact. Mild symmetric paraspinal muscle atrophy.  Level by level evaluation (moderately motion degraded axial T2):  C2-3: No disc bulge. Moderate LEFT facet arthropathy. No canal stenosis or neural foraminal narrowing.  C3-4: Small broad-based disc bulge/central disc protrusion, uncovertebral hypertrophy. Mild to moderate bilateral facet arthropathy. Mild canal stenosis. Severe bilateral neural foraminal narrowing.  C4-5:  Approximate 3 mm RIGHT central disc protrusion deforms the ventral spinal cord. Uncovertebral hypertrophy and mild facet arthropathy. Mild to moderate canal stenosis. Moderate RIGHT greater than LEFT neural foraminal narrowing.  C5-6: Small broad-based disc bulge and tiny RIGHT central disc protrusion contacts the ventral spinal cord. Uncovertebral hypertrophy. Mild canal stenosis. Severe bilateral neural foraminal narrowing.  C6-7: Small broad-based disc bulge, uncovertebral hypertrophy. No canal stenosis. Mild facet arthropathy. Mild bilateral neural foraminal narrowing.  C7-T1: No disc bulge, canal stenosis nor neural foraminal narrowing. Mild facet arthropathy.  IMPRESSION: MRI HEAD: Patchy area of acute ischemia RIGHT corona radiata extending into the posterior limb of the RIGHT internal capsule.  Otherwise unremarkable MRI of the brain for age.  MR CERVICAL SPINE: Motion degraded examination. No acute fracture or malalignment.  Degenerative change of the cervical spine. Mild to moderate canal stenosis C4-5, mild at C3-4 and C5-6.  Neural foraminal narrowing C3-4 thru C6-7: Severe bilaterally at C3-4 and C5-6.   Electronically Signed   By: Elon Alas M.D.   On: 10/18/2014 03:17    Scheduled Meds: . allopurinol  200 mg Oral Daily  . amLODipine  5 mg Oral Daily  . aspirin EC  81 mg Oral Daily  . atorvastatin  20 mg Oral QHS  . calcium-vitamin D  1 tablet Oral BID  . cholecalciferol  1,000 Units Oral Daily  . clopidogrel  75 mg Oral Daily  . dorzolamide-timolol  2 drop Both Eyes BID  . prednisoLONE acetate  1 drop Right Eye Daily  . sodium chloride  3 mL Intravenous Q12H  . tamsulosin  0.4 mg Oral QHS   Continuous Infusions: . sodium chloride 50 mL/hr at 10/18/14 0607     Time spent:20 minutes    Sofia Jaquith, [place]  [facility] Pager [phone]. If 7PM-7AM, please contact night-coverage at [email or web address], password [email or web address] 10/18/2014, 1:05 PM  LOS: 0 days

## 2014-10-18 NOTE — Progress Notes (Signed)
Echocardiogram 2D Echocardiogram has been performed.  Darlina Sicilian M 10/18/2014, 10:01 AM

## 2014-10-18 NOTE — Progress Notes (Signed)
UR Completed Dyan Creelman Graves-Bigelow, RN,BSN [phone]  

## 2014-10-18 NOTE — Consult Note (Addendum)
NEURO HOSPITALIST CONSULT NOTE   Referring physician: ED Reason for Consult: LLE weakness, fall  HPI:                                                                                                                                          Trevor Andrews is an 81 y.o. male with a past medical history significant for HTN, DM, hypercholesterolemia, CAD s/p stent deployment, prostate cancer s/p prostatectomy, and cognitive impairment, comes in accompany by family for further evaluation of the above stated symptoms. Patient was seen at [place] on 8/12 due to complains of acute onset bilateral LE weakness and had MRI thoracic spine that showed no evidence of cord lesions and MRI L spine that was aborted due to patient claustrophobia but was essentially unremarkable. He comes back today stating that his left LE feels weak " and I am not able to use it as before". He indicated that the left leg weakness is not worsening but is not getting any better and he fells this past Saturday because the left leg gave away.  [name] typically uses a cane when he goes out of the house but for the past 3 days have been using it even at home because he doesn't feel secure walking anymore. Denies recent trauma to the lower extremities or lower back. No back or LE pain. No numbness-tingling, no weakness of the left UE or right side. No recent bladder or bowel changes.  Denies HA, vertigo, double vision, slurred speech, language or vision impairment. Family thinks that the left lower face os droopier than the right face. CT brain was independently reviewed and showed no acute abnormality. Serologies were also reviewed and are unremarkable.  Last known normal: 7/68/11, uncertain time. TPA: not given due to late presentation.  Past Medical History  Diagnosis Date  . Hypertension   . Hypercholesteremia   . Arthritis   . Dementia   . Diabetes mellitus without complication   . Cancer     Past  Surgical History  Procedure Laterality Date  . Prostatectomy    . Cataract extraction  2011 and 2012    2011lt eye and rt eye 2012  . Glauma implant  2012    rt.eye  . Coronary angioplasty with stent placement      History reviewed. No pertinent family history.  Family History: no PD, MS, brain tumor, epilepsy, or brain aneurysm   Social History:  reports that he has never smoked. He has never used smokeless tobacco. He reports that he drinks about 1.2 oz of alcohol per week. He reports that he does not use illicit drugs.  No Known Allergies  MEDICATIONS:  I have reviewed the patient's current medications.   ROS:                                                                                                                                       History obtained from chart review and the patient as well as family  General ROS: negative for - chills, fatigue, fever, night sweats, weight gain or weight loss Psychological ROS: negative for - behavioral disorder, hallucinations, mood swings or suicidal ideation Ophthalmic ROS: negative for - blurry vision, double vision, eye pain or loss of vision ENT ROS: negative for - epistaxis, nasal discharge, oral lesions, sore throat, tinnitus or vertigo Allergy and Immunology ROS: negative for - hives or itchy/watery eyes Hematological and Lymphatic ROS: negative for - bleeding problems, bruising or swollen lymph nodes Endocrine ROS: negative for - galactorrhea, hair pattern changes, polydipsia/polyuria or temperature intolerance Respiratory ROS: negative for - cough, hemoptysis, shortness of breath or wheezing Cardiovascular ROS: negative for - chest pain, dyspnea on exertion, edema or irregular heartbeat Gastrointestinal ROS: negative for - abdominal pain, diarrhea, hematemesis, nausea/vomiting or stool  incontinence Genito-Urinary ROS: negative for - dysuria or hematuria Musculoskeletal ROS: negative for - joint swelling Neurological ROS: as noted in HPI Dermatological ROS: negative for rash and skin lesion changes  Physical exam:  Constitutional: well developed, pleasant male in no apparent distress. Blood pressure 163/77, pulse 54, temperature 98.5 F (36.9 C), temperature source Oral, resp. rate 20, height 6' 2.5" (1.892 m), weight 105.688 kg (233 lb), SpO2 94 %. Eyes: no jaundice or exophthalmos.  Head: normocephalic. Neck: supple, no bruits, no JVD. Cardiac: no murmurs. Lungs: clear. Abdomen: soft, no tender, no mass. Extremities: no edema, clubbing, or cyanosis.  Skin: no rash  Neurologic Examination:                                                                                                      General: Mental Status: Alert, oriented, thought content appropriate.  Speech fluent without evidence of aphasia.  Able to follow 3 step commands without difficulty. Cranial Nerves: II: Discs flat bilaterally; Visual fields grossly normal, pupils equal, round, reactive to light and accommodation III,IV, VI: ptosis not present, extra-ocular motions intact bilaterally V,VII: smile asymmetric due to mild left face weakness, facial light touch sensation normal bilaterally VIII: hearing normal bilaterally IX,X: uvula rises symmetrically XI: bilateral shoulder shrug XII: midline tongue extension without atrophy or fasciculations Motor: Right : Upper extremity   5/5  Left:     Upper extremity   5/5  Lower extremity   5/5     Lower extremity   5/5 Tone and bulk:normal tone throughout; no atrophy noted Sensory: Pinprick and light touch intact throughout, bilaterally Deep Tendon Reflexes:  Right: Upper Extremity   Left: Upper extremity   biceps (C-5 to C-6) 2/4   biceps (C-5 to C-6) 2/4 tricep (C7) 2/4    triceps (C7) 2/4 Brachioradialis (C6) 2/4  Brachioradialis (C6) 2/4  Lower  Extremity Lower Extremity  quadriceps (L-2 to L-4) 2/4   quadriceps (L-2 to L-4) 2/4 Achilles (S1) 2/4   Achilles (S1) 2/4  Plantars: Right: downgoing   Left: downgoing Cerebellar: normal finger-to-nose,  normal heel-to-shin test Gait:  No ataxia or bradykinesia but seems to have a component of gait apraxia.  No results found for: CHOL  Results for orders placed or performed during the hospital encounter of 10/17/14 (from the past 48 hour(s))  Protime-INR     Status: None   Collection Time: 10/17/14  5:53 PM  Result Value Ref Range   Prothrombin Time 14.0 11.6 - 15.2 seconds   INR 1.06 0.00 - 1.49  APTT     Status: None   Collection Time: 10/17/14  5:53 PM  Result Value Ref Range   aPTT 36 24 - 37 seconds  CBC     Status: None   Collection Time: 10/17/14  5:53 PM  Result Value Ref Range   WBC 5.8 4.0 - 10.5 K/uL   RBC 5.11 4.22 - 5.81 MIL/uL   Hemoglobin 14.8 13.0 - 17.0 g/dL   HCT 43.4 39.0 - 52.0 %   MCV 84.9 78.0 - 100.0 fL   MCH 29.0 26.0 - 34.0 pg   MCHC 34.1 30.0 - 36.0 g/dL   RDW 14.7 11.5 - 15.5 %   Platelets 242 150 - 400 K/uL  Differential     Status: None   Collection Time: 10/17/14  5:53 PM  Result Value Ref Range   Neutrophils Relative % 60 43 - 77 %   Neutro Abs 3.4 1.7 - 7.7 K/uL   Lymphocytes Relative 30 12 - 46 %   Lymphs Abs 1.7 0.7 - 4.0 K/uL   Monocytes Relative 9 3 - 12 %   Monocytes Absolute 0.5 0.1 - 1.0 K/uL   Eosinophils Relative 2 0 - 5 %   Eosinophils Absolute 0.1 0.0 - 0.7 K/uL   Basophils Relative 1 0 - 1 %   Basophils Absolute 0.0 0.0 - 0.1 K/uL  Comprehensive metabolic panel     Status: Abnormal   Collection Time: 10/17/14  5:53 PM  Result Value Ref Range   Sodium 138 135 - 145 mmol/L   Potassium 3.9 3.5 - 5.1 mmol/L   Chloride 102 101 - 111 mmol/L   CO2 27 22 - 32 mmol/L   Glucose, Bld 112 (H) 65 - 99 mg/dL   BUN 11 6 - 20 mg/dL   Creatinine, Ser 1.17 0.61 - 1.24 mg/dL   Calcium 10.1 8.9 - 10.3 mg/dL   Total Protein 8.0 6.5 -  8.1 g/dL   Albumin 4.0 3.5 - 5.0 g/dL   AST 22 15 - 41 U/L   ALT 17 17 - 63 U/L   Alkaline Phosphatase 67 38 - 126 U/L   Total Bilirubin 0.5 0.3 - 1.2 mg/dL   GFR calc non Af Amer 56 (L) >60 mL/min   GFR calc Af Amer >60 >60 mL/min  Comment: (NOTE) The eGFR has been calculated using the CKD EPI equation. This calculation has not been validated in all clinical situations. eGFR's persistently <60 mL/min signify possible Chronic Kidney Disease.    Anion gap 9 5 - 15  I-stat troponin, ED (not at [facility], [facility])     Status: None   Collection Time: 10/17/14  6:09 PM  Result Value Ref Range   Troponin i, poc 0.01 0.00 - 0.08 ng/mL   Comment 3            Comment: Due to the release kinetics of cTnI, a negative result within the first hours of the onset of symptoms does not rule out myocardial infarction with certainty. If myocardial infarction is still suspected, repeat the test at appropriate intervals.   I-Stat Chem 8, ED  (not at [facility], [facility])     Status: Abnormal   Collection Time: 10/17/14  6:11 PM  Result Value Ref Range   Sodium 141 135 - 145 mmol/L   Potassium 3.9 3.5 - 5.1 mmol/L   Chloride 102 101 - 111 mmol/L   BUN 14 6 - 20 mg/dL   Creatinine, Ser 1.00 0.61 - 1.24 mg/dL   Glucose, Bld 112 (H) 65 - 99 mg/dL   Calcium, Ion 1.29 1.13 - 1.30 mmol/L   TCO2 24 0 - 100 mmol/L   Hemoglobin 17.0 13.0 - 17.0 g/dL   HCT 50.0 39.0 - 52.0 %  CBG monitoring, ED     Status: Abnormal   Collection Time: 10/17/14  6:19 PM  Result Value Ref Range   Glucose-Capillary 102 (H) 65 - 99 mg/dL    Ct Head Wo Contrast  10/17/2014   CLINICAL DATA:  Acute onset of left-sided weakness and unsteady gait. Initial encounter.  EXAM: CT HEAD WITHOUT CONTRAST  TECHNIQUE: Contiguous axial images were obtained from the base of the skull through the vertex without intravenous contrast.  COMPARISON:  CT of the head performed 02/02/2010  FINDINGS: There is no evidence of acute infarction, mass lesion, or intra-  or extra-axial hemorrhage on CT.  A small chronic lacunar infarct is noted at the posterior right corona radiata.  The posterior fossa, including the cerebellum, brainstem and fourth ventricle, is within normal limits. The third and lateral ventricles, and basal ganglia are unremarkable in appearance. The cerebral hemispheres demonstrate grossly normal gray-white differentiation. No mass effect or midline shift is seen.  There is no evidence of fracture; visualized osseous structures are unremarkable in appearance. Postoperative change is noted at both optic globes. The paranasal sinuses and mastoid air cells are well-aerated. No significant soft tissue abnormalities are seen.  IMPRESSION: 1. No acute intracranial pathology seen on CT. 2. Small chronic lacunar infarct at the posterior right corona radiata, better seen than in 2011.   Electronically Signed   By: Garald Balding M.D.   On: 10/17/2014 21:12   Assessment/Plan: 81 y/o presents to the ED with complains of new onset left LE weakness and feeling insecure walking for the last 3 days. He was initially seen at [place] on 8/12 due to bilateral LE weakness. Patient neuro-exam reveals no objective weakness, but gait impairment that seems to be reminiscent of apraxia of gait. Further, left face droopiness that according to family is also new. He has been symptomatic for the last 3 days and at this time CT brain will be really informative if he had had an acute right ACA distribution infarct (but the certainly has not right LE weakness on exam). Additionally,  he has not findings on exam to suggest a myelopathy and MRI T spine few days ago showed no cord involvement. I wonder if patient has gait apraxia in the context of an early cognitive disorder, but at the same time has mild left face weakness and therefore will recommend MRI brain (will need IV ativan for severe claustrophobia) to fully investigate his complains. PT. Will follow up.     Dorian Pod, MD 10/18/2014, 12:59 AM

## 2014-10-18 NOTE — Evaluation (Signed)
Physical Therapy Evaluation Patient Details Name: Trevor Andrews MRN: [id number] DOB: December 30, 1929 Today's Date: 10/18/2014   History of Present Illness  81 y.o. male admitted to [facility] on 10/17/14 for left leg weakness and left facial droop.  MRI revealed (+) acute infarct in the posterior limb of the right internal capsule.  Pt with significant PMHx of HTN, dementia, DM, and prostate CA.    Clinical Impression  Pt is mobilizing with min assist and RW.  His baseline is independent in the home and mod I with a cane for community gait.  He would benefit from CIR level therapies at discharge if he is agreeable to help him return to more independence before return home to his family.  He does have some left foot drag and weakness during gait.   PT to follow acutely for deficits listed below.          CIR    Equipment Recommendations  Rolling walker with 5" wheels    Recommendations for Other Services Rehab consult     Precautions / Restrictions Precautions Precautions: Fall Precaution Comments: due to vision issues (baseline) and left sided weakness      Mobility  Bed Mobility Overal bed mobility: Needs Assistance Bed Mobility: Supine to Sit     Supine to sit: Min assist     General bed mobility comments: Min hand held assist to pull to sitting.  Extra time needed to complete the task.  Transfers Overall transfer level: Needs assistance Equipment used: Rolling walker (2 wheeled) Transfers: Sit to/from Stand Sit to Stand: Min assist         General transfer comment: Min assist to support trunk during transition to stand.  verbal cues for safe hand placement.   Ambulation/Gait Ambulation/Gait assistance: Min assist Ambulation Distance (Feet): 150 Feet Assistive device: Rolling walker (2 wheeled) Gait Pattern/deviations: Step-through pattern;Decreased step length - left;Decreased dorsiflexion - left;Steppage Gait velocity: decreased Gait velocity interpretation: Below normal  speed for age/gender General Gait Details: pt dragging left foot as he fatigues, no buckling noted at left knee, assist needed to steer RW due to vision deficits.  Min assist provided at trunk for balance during gait.      Modified Rankin (Stroke Patients Only) Modified Rankin (Stroke Patients Only) Pre-Morbid Rankin Score: No symptoms Modified Rankin: Moderately severe disability     Balance Overall balance assessment: Needs assistance Sitting-balance support: Feet supported;No upper extremity supported Sitting balance-Leahy Scale: Good     Standing balance support: Bilateral upper extremity supported Standing balance-Leahy Scale: Poor                               Pertinent Vitals/Pain Pain Assessment: No/denies pain    Home Living Family/patient expects to be discharged to:: Private residence Living Arrangements: Spouse/significant other;Children (two daughters) Available Help at Discharge: Family;Available 24 hours/day Type of Home: House Home Access: Stairs to enter Entrance Stairs-Rails: Right Entrance Stairs-Number of Steps: 5 Home Layout: Multi-level Home Equipment: Cane - single point      Prior Function Level of Independence: Independent with assistive device(s)         Comments: used cane for community ambulation, daughters helped his wife and did heavier houswork, cooking, cleaning.  Pt was independent in bathing, dreesing, and toileting.  He doesn't drive due to baseline visual deficits (left eye worse than right).         Extremity/Trunk Assessment   Upper Extremity Assessment: Defer  to OT evaluation           Lower Extremity Assessment: LLE deficits/detail   LLE Deficits / Details: left leg tests 4-4+/5 with bed level MMT, however, functionally he has some foot and leg lag/drag during gait.  Pt self reports left leg feels weaker while standing and walking.   Cervical / Trunk Assessment: Normal  Communication   Communication: No  difficulties  Cognition Arousal/Alertness: Awake/alert Behavior During Therapy: WFL for tasks assessed/performed Overall Cognitive Status: No family/caregiver present to determine baseline cognitive functioning (hx lists dementia, no family present, not specifically teste)                               Assessment/Plan    PT Assessment Patient needs continued PT services  PT Diagnosis Difficulty walking;Abnormality of gait;Generalized weakness;Hemiplegia non-dominant side   PT Problem List Decreased strength;Decreased activity tolerance;Decreased balance;Decreased mobility;Decreased knowledge of use of DME;Decreased knowledge of precautions  PT Treatment Interventions DME instruction;Gait training;Stair training;Functional mobility training;Therapeutic activities;Therapeutic exercise;Balance training;Neuromuscular re-education;Patient/family education   PT Goals (Current goals can be found in the Care Plan section) Acute Rehab PT Goals Patient Stated Goal: to go home PT Goal Formulation: With patient Time For Goal Achievement: 11/01/14 Potential to Achieve Goals: Good    Frequency Min 4X/week   Barriers to discharge Inaccessible home environment pt has a three level home.  His bedroom is upstairs and he has 5 STE.         End of Session Equipment Utilized During Treatment: Gait belt Activity Tolerance: Patient limited by fatigue Patient left: in chair;with call bell/phone within reach;with chair alarm set           Time: [phone] PT Time Calculation (min) (ACUTE ONLY): 43 min   Charges:   PT Evaluation $Initial PT Evaluation Tier I: 1 Procedure PT Treatments $Gait Training: 8-22 mins $Therapeutic Activity: 8-22 mins        Jaymin Waln B. [place], [place], DPT [phone]   10/18/2014, 5:21 PM

## 2014-10-18 NOTE — Progress Notes (Signed)
Received report on patient from [facility] ED nurse. Patient is at MRI, Admitting MD is to see patient before patient is transferred to 5W.

## 2014-10-18 NOTE — Progress Notes (Signed)
Rehab Admissions Coordinator Note:  Patient was screened by Cleatrice Burke for appropriateness for an Inpatient Acute Rehab Consult per PT recommendation.  At this time, we are recommending Inpatient Rehab consult. Admission would be dependent on bed availability and appropriateness for admission per Rehab MD. Please place order.  Cleatrice Burke 10/18/2014, 7:07 PM  I can be reached at [phone].

## 2014-10-19 ENCOUNTER — Encounter (HOSPITAL_COMMUNITY): Payer: Non-veteran care

## 2014-10-19 DIAGNOSIS — E119 Type 2 diabetes mellitus without complications: Secondary | ICD-10-CM

## 2014-10-19 DIAGNOSIS — I6350 Cerebral infarction due to unspecified occlusion or stenosis of unspecified cerebral artery: Secondary | ICD-10-CM

## 2014-10-19 DIAGNOSIS — G8190 Hemiplegia, unspecified affecting unspecified side: Secondary | ICD-10-CM

## 2014-10-19 DIAGNOSIS — I63311 Cerebral infarction due to thrombosis of right middle cerebral artery: Principal | ICD-10-CM

## 2014-10-19 DIAGNOSIS — R269 Unspecified abnormalities of gait and mobility: Secondary | ICD-10-CM

## 2014-10-19 DIAGNOSIS — I639 Cerebral infarction, unspecified: Secondary | ICD-10-CM

## 2014-10-19 DIAGNOSIS — F0390 Unspecified dementia without behavioral disturbance: Secondary | ICD-10-CM

## 2014-10-19 LAB — COMPREHENSIVE METABOLIC PANEL
ALBUMIN: 3.4 g/dL — AB (ref 3.5–5.0)
ALK PHOS: 63 U/L (ref 38–126)
ALT: 16 U/L — AB (ref 17–63)
AST: 16 U/L (ref 15–41)
Anion gap: 7 (ref 5–15)
BILIRUBIN TOTAL: 0.7 mg/dL (ref 0.3–1.2)
BUN: 13 mg/dL (ref 6–20)
CALCIUM: 9.7 mg/dL (ref 8.9–10.3)
CO2: 27 mmol/L (ref 22–32)
Chloride: 104 mmol/L (ref 101–111)
Creatinine, Ser: 1.21 mg/dL (ref 0.61–1.24)
GFR calc Af Amer: >60 mL/min (ref >60)
GFR calc non Af Amer: 54 mL/min — ABNORMAL LOW (ref >60)
GLUCOSE: 118 mg/dL — AB (ref 65–99)
Potassium: 3.8 mmol/L (ref 3.5–5.1)
SODIUM: 138 mmol/L (ref 135–145)
TOTAL PROTEIN: 6.9 g/dL (ref 6.5–8.1)

## 2014-10-19 LAB — T4, FREE: Free T4: 0.86 ng/dL (ref 0.61–1.12)

## 2014-10-19 LAB — URINALYSIS, ROUTINE W REFLEX MICROSCOPIC
Bilirubin Urine: NEGATIVE
GLUCOSE, UA: NEGATIVE mg/dL
Hgb urine dipstick: NEGATIVE
KETONES UR: NEGATIVE mg/dL
LEUKOCYTES UA: NEGATIVE
NITRITE: NEGATIVE
PROTEIN: NEGATIVE mg/dL
Specific Gravity, Urine: 1.011 (ref 1.005–1.030)
Urobilinogen, UA: 0.2 mg/dL (ref 0.0–1.0)
pH: 5.5 (ref 5.0–8.0)

## 2014-10-19 LAB — GLUCOSE, CAPILLARY
GLUCOSE-CAPILLARY: 108 mg/dL — AB (ref 65–99)
Glucose-Capillary: 97 mg/dL (ref 65–99)
Glucose-Capillary: 100 mg/dL — ABNORMAL HIGH (ref 65–99)
Glucose-Capillary: 92 mg/dL (ref 65–99)

## 2014-10-19 LAB — LIPID PANEL
CHOL/HDL RATIO: 3.5 ratio
CHOLESTEROL: 129 mg/dL (ref 0–200)
HDL: 37 mg/dL — AB (ref >40)
LDL CALC: 65 mg/dL (ref 0–99)
TRIGLYCERIDES: 133 mg/dL (ref <150)
VLDL: 27 mg/dL (ref 0–40)

## 2014-10-19 LAB — TSH: TSH: 4.554 u[IU]/mL — ABNORMAL HIGH (ref 0.350–4.500)

## 2014-10-19 LAB — HEMOGLOBIN A1C
Hgb A1c MFr Bld: 6.6 % — ABNORMAL HIGH (ref 4.8–5.6)
Mean Plasma Glucose: 143 mg/dL

## 2014-10-19 NOTE — Evaluation (Signed)
Occupational Therapy Evaluation Patient Details Name: Trevor Andrews MRN: [id number] DOB: 08-05-1929 Today's Date: 10/19/2014    History of Present Illness 81 y.o. male admitted to [facility] on 10/17/14 for left leg weakness and left facial droop.  MRI revealed (+) acute infarct in the posterior limb of the right internal capsule.  Pt with significant PMHx of HTN, dementia, DM, and prostate CA.     Clinical Impression   Pt admitted with above. Pt independent with ADLs, PTA. Feel pt will benefit from acute OT to increase independence prior to d/c.    Follow Up Recommendations  CIR    Equipment Recommendations  Other (comment) (defer to next venue)    Recommendations for Other Services       Precautions / Restrictions Precautions Precautions: Fall Precaution Comments: due to vision issues (baseline) and left sided weakness Restrictions Weight Bearing Restrictions: No      Mobility Bed Mobility               General bed mobility comments: sitting EOB  Transfers Overall transfer level: Needs assistance   Transfers: Sit to/from Stand Sit to Stand: Min guard              Balance      Used RW for ambulation. No LOB in session.                                      ADL Overall ADL's : Needs assistance/impaired                     Lower Body Dressing: Min guard;Sit to/from stand   Toilet Transfer: Minimal assistance;Ambulation;RW (3 in 1 over toilet)   Toileting- Clothing Manipulation and Hygiene: Minimal assistance;Sit to/from stand       Functional mobility during ADLs: Minimal assistance;Rolling walker       Vision   Pt legally blind. Wears glasses all the time.  Perception     Praxis      Pertinent Vitals/Pain Pain Assessment: 0-10 Pain Score: 5  Pain Location: back Pain Descriptors / Indicators: Sore Pain Intervention(s): Monitored during session;Repositioned     Hand Dominance     Extremity/Trunk Assessment Upper  Extremity Assessment Upper Extremity Assessment: Generalized weakness   Lower Extremity Assessment Lower Extremity Assessment: Defer to PT evaluation       Communication Communication Communication: No difficulties   Cognition Arousal/Alertness: Awake/alert Behavior During Therapy: WFL for tasks assessed/performed Overall Cognitive Status: Within Functional Limits for tasks assessed (hx lists dementia; no family present)                     General Comments       Exercises       Shoulder Instructions      Home Living Family/patient expects to be discharged to:: Private residence Living Arrangements: Spouse/significant other;Children (two daughters) Available Help at Discharge: Family;Available 24 hours/day Type of Home: House Home Access: Stairs to enter [employer] of Steps: 5 Entrance Stairs-Rails: Right Home Layout: Multi-level Alternate Level Stairs-Number of Steps: flight Alternate Level Stairs-Rails: Right Bathroom Shower/Tub: Occupational psychologist: Handicapped height     Home Equipment: Cane - single point;Shower seat          Prior Functioning/Environment Level of Independence: Independent with assistive device(s)        Comments: used cane for community ambulation, daughters helped  his wife and did heavier houswork, cooking, cleaning.  Pt was independent in bathing, dreesing, and toileting.  He doesn't drive due to baseline visual deficits (left eye worse than right).     OT Diagnosis: Generalized weakness; Hemiparesis non-dominant side   OT Problem List: Decreased strength;Impaired vision/perception;Decreased knowledge of use of DME or AE;Pain   OT Treatment/Interventions: Self-care/ADL training;Therapeutic exercise;DME and/or AE instruction;Therapeutic activities;Balance training;Patient/family education;Visual/perceptual remediation/compensation    OT Goals(Current goals can be found in the care plan section) Acute  Rehab OT Goals Patient Stated Goal: get back to walking and exercise OT Goal Formulation: With patient Time For Goal Achievement: 10/26/14 Potential to Achieve Goals: Good ADL Goals Pt Will Perform Lower Body Bathing: with supervision;sit to/from stand (including gathering items) Pt Will Perform Lower Body Dressing: with supervision;sit to/from stand (including gathering items) Pt Will Transfer to Toilet: with supervision;ambulating Pt Will Perform Toileting - Clothing Manipulation and hygiene: sit to/from stand;with modified independence (3 in 1 over toilet) Additional ADL Goal #1: Pt will independently perform bilateral UE exercises to increase strength.  OT Frequency: Min 2X/week   Barriers to D/C:            Co-evaluation              End of Session Equipment Utilized During Treatment: Gait belt;Rolling walker Nurse Communication: Mobility status (talked with nurse tech; pt in chair with alarm)  Activity Tolerance: Patient tolerated treatment well Patient left: in chair;with call bell/phone within reach;with chair alarm set   Time: [phone] OT Time Calculation (min): 18 min Charges:  OT General Charges $OT Visit: 1 Procedure OT Evaluation $Initial OT Evaluation Tier I: 1 Procedure G-CodesBenito Mccreedy OTR/L [phone] 10/19/2014, 12:41 PM

## 2014-10-19 NOTE — Progress Notes (Signed)
[facility] PROGRESS NOTE  TAIVON HAROON TDV:[id number] DOB: 02/28/1930 DOA: 10/17/2014 PCP: Elizabeth Palau, MD  Brief narrative 81 year old male, legally blind, with multiple medical problems including hypertension, diabetes mellitus, hyperlipidemia, coronary artery disease with history of stent, prostate cancer status post prostatectomy, mild dementia presented to the ED for left lower leg weakness and falls. He was seen in the lung ED on 8/12 due to acute bilateral lower extremity weakness for which he had MRI of the thoracic spine which was negative (unable to complete lumbar spine due to claustrophobia) and was discharged home. Head CT on admission was unremarkable however MRI of the brain showed acute infarct lower right posterior corona radiata.   Assessment/Plan: Acute right ischemic stroke Patchy area of infarct over right coronary data extending to the posterior limb of the right internal capsule on MRI, which explains his left-sided weakness.  aspirin 81 mg orally every day and clopidogrel 75 mg orally every day. Continued dural antiplatelet for stroke and cardiac prevention Allow permissive blood pressure. Check 2-D echo and carotid Doppler. -PT/OT evaluation. -CIR consult placed -Neurology following. LDL 65, triglycerides 133  Coronary artery disease with stent placement Continue aspirin/Plavix and Lipitor.   Diabetes mellitus without complications Monitor on sliding scale insulin. Hemoglobin A1c 6.6  Dementia Mild admitted impairment.  Hypokalemia  replenished  BPH Continue Flomax  DVT prophylaxis: Subcutaneous Lovenox  Diet: Heart healthy  Code Status: full code Family Communication: none at bedside Disposition Plan: currently inpt. May need SNF   Consultants:  Neurology  Procedures:  MRI brain/ cervical spine   2 echo  Carotid doppler  Antibiotics:  none  HPI/Subjective: He does have some left foot drag and weakness during  gait  Objective: Filed Vitals:   10/19/14 1145  BP:   Pulse:   Temp: 97.6 F (36.4 C)  Resp:     Intake/Output Summary (Last 24 hours) at 10/19/14 1335 Last data filed at 10/19/14 0900  Gross per 24 hour  Intake 751.67 ml  Output    500 ml  Net 251.67 ml   Filed Weights   10/17/14 1736 10/18/14 0517 10/19/14 0503  Weight: 105.688 kg (233 lb) 98.567 kg (217 lb 4.8 oz) 98.793 kg (217 lb 12.8 oz)    Exam:   General: Elderly male lying in bed not in distress, has mild facial droop  HEENT: No pallor, moist oral mucosa, supple neck  Chest: Clear to auscultation bilaterally  CVS: Normal S1 and S2, no murmurs  GI: Soft, nondistended, nontender, bowel sounds present  Musculoskeletal: Warm, no edema  CNS: Alert and oriented, 4+/ 5 power over left lower extremity    Data Reviewed: Basic Metabolic Panel:  Recent Labs Lab 10/14/14 0745 10/17/14 1753 10/17/14 1811 10/18/14 0610 10/19/14 0953  NA 137 138 141 138 138  K 3.0* 3.9 3.9 3.4* 3.8  CL 104 102 102 104 104  CO2 23 27  --  25 27  GLUCOSE 154* 112* 112* 125* 118*  BUN 10 11 14 11 13   CREATININE 1.09 1.17 1.00 1.19 1.21  CALCIUM 9.5 10.1  --  9.2 9.7   Liver Function Tests:  Recent Labs Lab 10/14/14 0745 10/17/14 1753 10/19/14 0953  AST 18 22 16   ALT 15* 17 16*  ALKPHOS 62 67 63  BILITOT 0.8 0.5 0.7  PROT 7.8 8.0 6.9  ALBUMIN 3.9 4.0 3.4*   No results for input(s): LIPASE, AMYLASE in the last 168 hours. No results for input(s): AMMONIA in the last 168 hours.  CBC:  Recent Labs Lab 10/14/14 0745 10/17/14 1753 10/17/14 1811 10/18/14 0610  WBC 5.6 5.8  --  5.3  NEUTROABS 3.7 3.4  --   --   HGB 13.9 14.8 17.0 14.2  HCT 41.1 43.4 50.0 42.5  MCV 85.1 84.9  --  85.0  PLT 201 242  --  202   Cardiac Enzymes:  Recent Labs Lab 10/14/14 0745  TROPONINI <0.03   BNP (last 3 results) No results for input(s): BNP in the last 8760 hours.  ProBNP (last 3 results) No results for input(s):  PROBNP in the last 8760 hours.  CBG:  Recent Labs Lab 10/18/14 1119 10/18/14 1628 10/18/14 2110 10/19/14 0733 10/19/14 1133  GLUCAP 123* 130* 90 97 100*    Recent Results (from the past 240 hour(s))  Urine culture     Status: None   Collection Time: 10/14/14  9:03 AM  Result Value Ref Range Status   Specimen Description URINE, CLEAN CATCH  Final   Special Requests NONE  Final   Culture   Final    7,000 COLONIES/mL INSIGNIFICANT GROWTH Performed at [facility]    Report Status 10/15/2014 FINAL  Final     Studies: Ct Head Wo Contrast  10/17/2014   CLINICAL DATA:  Acute onset of left-sided weakness and unsteady gait. Initial encounter.  EXAM: CT HEAD WITHOUT CONTRAST  TECHNIQUE: Contiguous axial images were obtained from the base of the skull through the vertex without intravenous contrast.  COMPARISON:  CT of the head performed 02/02/2010  FINDINGS: There is no evidence of acute infarction, mass lesion, or intra- or extra-axial hemorrhage on CT.  A small chronic lacunar infarct is noted at the posterior right corona radiata.  The posterior fossa, including the cerebellum, brainstem and fourth ventricle, is within normal limits. The third and lateral ventricles, and basal ganglia are unremarkable in appearance. The cerebral hemispheres demonstrate grossly normal gray-white differentiation. No mass effect or midline shift is seen.  There is no evidence of fracture; visualized osseous structures are unremarkable in appearance. Postoperative change is noted at both optic globes. The paranasal sinuses and mastoid air cells are well-aerated. No significant soft tissue abnormalities are seen.  IMPRESSION: 1. No acute intracranial pathology seen on CT. 2. Small chronic lacunar infarct at the posterior right corona radiata, better seen than in 2011.   Electronically Signed   By: Garald Balding M.D.   On: 10/17/2014 21:12   Mr Brain Wo Contrast  10/18/2014   CLINICAL DATA:  Acute onset  bilateral lower extremity weakness October 14, 2014. Symptoms recurring today with leg weakness. History of hypertension, dementia, diabetes, cancer.  EXAM: MRI HEAD WITHOUT CONTRAST  MRI CERVICAL SPINE WITHOUT CONTRAST  TECHNIQUE: Multiplanar, multiecho pulse sequences of the brain and surrounding structures, and cervical spine, to include the craniocervical junction and cervicothoracic junction, were obtained without intravenous contrast.  COMPARISON:  None.  CT of the head October 17, 2014  FINDINGS: MRI HEAD FINDINGS  Multiple small foci of confluent acute ischemia, measuring 23 x 18 mm within RIGHT corona radiata, extending into the posterior limb of the RIGHT internal capsule. Corresponding slightly decreased ADC values and, FLAIR T2 hyperintense signal. No susceptibility artifact to suggest hemorrhage. Ventricles and sulci are normal for patient's age. Minimal white matter T2 hyperintensities exclusive of the aforementioned abnormality consistent with chronic small vessel ischemic disease, less than expected for age. No midline shift, mass effect or mass lesions.  No abnormal extra-axial fluid collections. Status post bilateral  ocular lens implants and, RIGHT scleral banding. Trace paranasal sinus mucosal thickening without air-fluid levels. The mastoid air cells are well aerated. No abnormal sellar expansion. No cerebellar tonsillar ectopia. No suspicious calvarial bone marrow signal.  MR CERVICAL SPINE FINDINGS  Cervical vertebral bodies intact and aligned and maintenance of cervical lordosis. Mild to moderate C5-6 disc height loss, mild at C3-4. Decreased T2 signal within all cervical disc consistent with moderate desiccation. Moderate chronic discogenic endplate changes B7-1 and C6-7, mild at C3-4. No STIR signal abnormality to suggest acute osseous process.  Cervical spinal cord is normal morphology and signal characteristics from the cervical medullary junction to level of T3-4, the most caudal well  visualized level. Craniocervical junction is intact. Mild symmetric paraspinal muscle atrophy.  Level by level evaluation (moderately motion degraded axial T2):  C2-3: No disc bulge. Moderate LEFT facet arthropathy. No canal stenosis or neural foraminal narrowing.  C3-4: Small broad-based disc bulge/central disc protrusion, uncovertebral hypertrophy. Mild to moderate bilateral facet arthropathy. Mild canal stenosis. Severe bilateral neural foraminal narrowing.  C4-5: Approximate 3 mm RIGHT central disc protrusion deforms the ventral spinal cord. Uncovertebral hypertrophy and mild facet arthropathy. Mild to moderate canal stenosis. Moderate RIGHT greater than LEFT neural foraminal narrowing.  C5-6: Small broad-based disc bulge and tiny RIGHT central disc protrusion contacts the ventral spinal cord. Uncovertebral hypertrophy. Mild canal stenosis. Severe bilateral neural foraminal narrowing.  C6-7: Small broad-based disc bulge, uncovertebral hypertrophy. No canal stenosis. Mild facet arthropathy. Mild bilateral neural foraminal narrowing.  C7-T1: No disc bulge, canal stenosis nor neural foraminal narrowing. Mild facet arthropathy.  IMPRESSION: MRI HEAD: Patchy area of acute ischemia RIGHT corona radiata extending into the posterior limb of the RIGHT internal capsule.  Otherwise unremarkable MRI of the brain for age.  MR CERVICAL SPINE: Motion degraded examination. No acute fracture or malalignment.  Degenerative change of the cervical spine. Mild to moderate canal stenosis C4-5, mild at C3-4 and C5-6.  Neural foraminal narrowing C3-4 thru C6-7: Severe bilaterally at C3-4 and C5-6.   Electronically Signed   By: Elon Alas M.D.   On: 10/18/2014 03:17   Mr Cervical Spine Wo Contrast  10/18/2014   CLINICAL DATA:  Acute onset bilateral lower extremity weakness October 14, 2014. Symptoms recurring today with leg weakness. History of hypertension, dementia, diabetes, cancer.  EXAM: MRI HEAD WITHOUT CONTRAST  MRI  CERVICAL SPINE WITHOUT CONTRAST  TECHNIQUE: Multiplanar, multiecho pulse sequences of the brain and surrounding structures, and cervical spine, to include the craniocervical junction and cervicothoracic junction, were obtained without intravenous contrast.  COMPARISON:  None.  CT of the head October 17, 2014  FINDINGS: MRI HEAD FINDINGS  Multiple small foci of confluent acute ischemia, measuring 23 x 18 mm within RIGHT corona radiata, extending into the posterior limb of the RIGHT internal capsule. Corresponding slightly decreased ADC values and, FLAIR T2 hyperintense signal. No susceptibility artifact to suggest hemorrhage. Ventricles and sulci are normal for patient's age. Minimal white matter T2 hyperintensities exclusive of the aforementioned abnormality consistent with chronic small vessel ischemic disease, less than expected for age. No midline shift, mass effect or mass lesions.  No abnormal extra-axial fluid collections. Status post bilateral ocular lens implants and, RIGHT scleral banding. Trace paranasal sinus mucosal thickening without air-fluid levels. The mastoid air cells are well aerated. No abnormal sellar expansion. No cerebellar tonsillar ectopia. No suspicious calvarial bone marrow signal.  MR CERVICAL SPINE FINDINGS  Cervical vertebral bodies intact and aligned and maintenance of cervical lordosis. Mild to  moderate C5-6 disc height loss, mild at C3-4. Decreased T2 signal within all cervical disc consistent with moderate desiccation. Moderate chronic discogenic endplate changes D6-3 and C6-7, mild at C3-4. No STIR signal abnormality to suggest acute osseous process.  Cervical spinal cord is normal morphology and signal characteristics from the cervical medullary junction to level of T3-4, the most caudal well visualized level. Craniocervical junction is intact. Mild symmetric paraspinal muscle atrophy.  Level by level evaluation (moderately motion degraded axial T2):  C2-3: No disc bulge. Moderate  LEFT facet arthropathy. No canal stenosis or neural foraminal narrowing.  C3-4: Small broad-based disc bulge/central disc protrusion, uncovertebral hypertrophy. Mild to moderate bilateral facet arthropathy. Mild canal stenosis. Severe bilateral neural foraminal narrowing.  C4-5: Approximate 3 mm RIGHT central disc protrusion deforms the ventral spinal cord. Uncovertebral hypertrophy and mild facet arthropathy. Mild to moderate canal stenosis. Moderate RIGHT greater than LEFT neural foraminal narrowing.  C5-6: Small broad-based disc bulge and tiny RIGHT central disc protrusion contacts the ventral spinal cord. Uncovertebral hypertrophy. Mild canal stenosis. Severe bilateral neural foraminal narrowing.  C6-7: Small broad-based disc bulge, uncovertebral hypertrophy. No canal stenosis. Mild facet arthropathy. Mild bilateral neural foraminal narrowing.  C7-T1: No disc bulge, canal stenosis nor neural foraminal narrowing. Mild facet arthropathy.  IMPRESSION: MRI HEAD: Patchy area of acute ischemia RIGHT corona radiata extending into the posterior limb of the RIGHT internal capsule.  Otherwise unremarkable MRI of the brain for age.  MR CERVICAL SPINE: Motion degraded examination. No acute fracture or malalignment.  Degenerative change of the cervical spine. Mild to moderate canal stenosis C4-5, mild at C3-4 and C5-6.  Neural foraminal narrowing C3-4 thru C6-7: Severe bilaterally at C3-4 and C5-6.   Electronically Signed   By: Elon Alas M.D.   On: 10/18/2014 03:17    Scheduled Meds: . allopurinol  200 mg Oral Daily  . amLODipine  5 mg Oral Daily  . aspirin EC  81 mg Oral Daily  . atorvastatin  20 mg Oral QHS  . brimonidine  1 drop Both Eyes TID  . calcium-vitamin D  1 tablet Oral BID  . cholecalciferol  1,000 Units Oral Daily  . clopidogrel  75 mg Oral Daily  . dorzolamide-timolol  2 drop Both Eyes BID  . enoxaparin (LOVENOX) injection  40 mg Subcutaneous Q24H  . prednisoLONE acetate  1 drop Right Eye  Daily  . sodium chloride  3 mL Intravenous Q12H  . tamsulosin  0.4 mg Oral QHS   Continuous Infusions:     Time spent:20 minutes    [facility]  [facility] Pager [phone] . If 7PM-7AM, please contact night-coverage at [email or web address], password [facility] 10/19/2014, 1:35 PM  LOS: 1 day

## 2014-10-19 NOTE — Progress Notes (Signed)
Physical Therapy Treatment Patient Details Name: Trevor Andrews MRN: [id number] DOB: 09-May-1929 Today's Date: 10/19/2014    History of Present Illness 81 y.o. male admitted to [facility] on 10/17/14 for left leg weakness and left facial droop.  MRI revealed (+) acute infarct in the posterior limb of the right internal capsule.  Pt with significant PMHx of HTN, dementia, DM, and prostate CA.      PT Comments    Pt is progressing well with his mobility, but needs assist for safety with a RW.  We will try a cane tomorrow, but I don't know that he is quite ready for this given his left leg weakness/foot drag.  CIR was denied, so pt is appropriate for SNF placement given that his wife is currently in the hospital as well.   Follow Up Recommendations  SNF     Equipment Recommendations  Rolling walker with 5" wheels    Recommendations for Other Services   NA     Precautions / Restrictions Precautions Precautions: Fall Precaution Comments: due to vision issues (baseline) and left sided weakness    Mobility  Bed Mobility Overal bed mobility: Needs Assistance Bed Mobility: Supine to Sit;Sit to Supine     Supine to sit: Supervision Sit to supine: Supervision   General bed mobility comments: supervision for safety  Transfers Overall transfer level: Needs assistance Equipment used: Rolling walker (2 wheeled) Transfers: Sit to/from Stand Sit to Stand: Min assist;From elevated surface         General transfer comment: Min assist to support trunk during transitions.  Two trials needed before he successfully got onto his feet and only after therapist elevated the bed.   Ambulation/Gait Ambulation/Gait assistance: Min assist Ambulation Distance (Feet): 200 Feet Assistive device: Rolling walker (2 wheeled) Gait Pattern/deviations: Step-through pattern;Decreased dorsiflexion - left;Decreased step length - left     General Gait Details: decreased left foot clearance.  Verbal cues for safe RW  use and min assist for balance at trunk.  Assist also needed to steer RW and verbal cues for pt to stay inside of the RW.         Modified Rankin (Stroke Patients Only) Modified Rankin (Stroke Patients Only) Pre-Morbid Rankin Score: No symptoms Modified Rankin: Moderately severe disability     Balance Overall balance assessment: Needs assistance Sitting-balance support: Feet supported;No upper extremity supported Sitting balance-Leahy Scale: Good     Standing balance support: Bilateral upper extremity supported Standing balance-Leahy Scale: Poor                      Cognition Arousal/Alertness: Awake/alert Behavior During Therapy: WFL for tasks assessed/performed Overall Cognitive Status: No family/caregiver present to determine baseline cognitive functioning                      Exercises General Exercises - Upper Extremity Shoulder Flexion: AROM;Both;10 reps;Seated General Exercises - Lower Extremity Long Arc Quad: AROM;Both;10 reps;Seated Hip ABduction/ADduction: AROM;Both;10 reps;Seated Hip Flexion/Marching: AROM;Both;10 reps;Seated Toe Raises: AROM;Both;10 reps;Seated Heel Raises: AROM;Both;10 reps;Seated        Pertinent Vitals/Pain Pain Assessment: No/denies pain           PT Goals (current goals can now be found in the care plan section) Acute Rehab PT Goals Patient Stated Goal: get back to walking and exercise Progress towards PT goals: Progressing toward goals    Frequency  Min 4X/week    PT Plan Current plan remains appropriate  End of Session Equipment Utilized During Treatment: Gait belt Activity Tolerance: Patient tolerated treatment well Patient left: in bed;with call bell/phone within reach;with bed alarm set     Time: 7948-0165 PT Time Calculation (min) (ACUTE ONLY): 19 min  Charges:  $Gait Training: 8-22 mins                      Trevor Andrews, [place], DPT [phone]   10/19/2014, 4:41 PM

## 2014-10-19 NOTE — Consult Note (Signed)
Physical Medicine and Rehabilitation Consult  Reason for Consult: Left sided weakness and left facial weakness Referring Physician:  Dr. Allyson Sabal   HPI: Trevor Andrews is a 81 y.o.  RH-male with history of DM type 2, HTN, LBP, glaucoma--legally blind, cognitive disorder;  who was admitted on 10/17/14 with  LLE weakness and fall. MRI brain/Cervical spine with patchy area of acute ischemia RIGHT corona radiata extending into the posterior limb of the RIGHT internal capsule. 2D echo with EF 55% with milde LVH and aortic sclerosis without stenosis. Dr. Erlinda Hong recommended continuing ASA/Plavix for  thrombotic secondary stoke prevention due to SVD. PT evaluation done today and CIR recommended for follow up therapy.    Review of Systems  HENT: Negative for hearing loss.   Eyes: Positive for blurred vision. Negative for photophobia and pain.       Legally blind--left eye see shadows and decreased vision right eye  Respiratory: Positive for shortness of breath (occasionally at nights).   Cardiovascular: Negative for chest pain and palpitations.  Gastrointestinal: Negative for heartburn, nausea, abdominal pain and constipation.  Genitourinary: Positive for urgency.  Musculoskeletal: Positive for myalgias and back pain.  Skin: Negative for rash.  Neurological: Positive for dizziness (with positional changes) and focal weakness. Negative for headaches.  Psychiatric/Behavioral: The patient has insomnia.       Past Medical History  Diagnosis Date  . Hypertension   . Hypercholesteremia   . Arthritis   . Dementia   . Diabetes mellitus without complication   . Cancer 1998    prostate    Past Surgical History  Procedure Laterality Date  . Prostatectomy    . Cataract extraction  2011 and 2012    2011lt eye and rt eye 2012  . Glauma implant  2012    rt.eye  . Coronary angioplasty with stent placement      History reviewed. No pertinent family history.    Social History:  Married. Lives  with wife (in rehab due to prolonged illness) and daughters. Retired from [employer] Dis[employer] [place].  He reports that he has never smoked. He has never used smokeless tobacco. He reports that he drinks about 1.2 oz of alcohol per week. He reports that he does not use illicit drugs.    Allergies: No Known Allergies    Medications Prior to Admission  Medication Sig Dispense Refill  . acetaminophen (TYLENOL) 500 MG tablet Take 1,000 mg by mouth every 6 (six) hours as needed (for pain).     Marland Kitchen allopurinol (ZYLOPRIM) 100 MG tablet Take 200 mg by mouth daily.     Marland Kitchen amLODipine (NORVASC) 10 MG tablet Take 5 mg by mouth daily.     Marland Kitchen aspirin EC 81 MG tablet Take 81 mg by mouth daily.    Marland Kitchen atorvastatin (LIPITOR) 40 MG tablet Take 20 mg by mouth at bedtime.    . Calcium Carbonate-Vitamin D (CALCIUM 600+D) 600-400 MG-UNIT per tablet Take 1 tablet by mouth 2 (two) times daily.    . cholecalciferol (VITAMIN D) 1000 UNITS tablet Take 1,000 Units by mouth daily.    . clopidogrel (PLAVIX) 75 MG tablet Take 75 mg by mouth daily.    . dorzolamide-timolol (COSOPT) 22.3-6.8 MG/ML ophthalmic solution Place 2 drops into both eyes Twice daily.    . polyvinyl alcohol (LIQUIFILM TEARS) 1.4 % ophthalmic solution Place 1 drop into both eyes 3 (three) times daily as needed for dry eyes.    . prednisoLONE acetate (PRED  FORTE) 1 % ophthalmic suspension Place 1 drop into the right eye daily.     . tamsulosin (FLOMAX) 0.4 MG CAPS capsule Take 0.4 mg by mouth at bedtime.      Home: Home Living Family/patient expects to be discharged to:: Private residence Living Arrangements: Spouse/significant other, Children (two daughters) Available Help at Discharge: Family, Available 24 hours/day Type of Home: House Home Access: Stairs to enter Technical brewer of Steps: 5 Entrance Stairs-Rails: Right Home Layout: Multi-level Alternate Level Stairs-Number of Steps: flight Alternate Level Stairs-Rails:  Right Home Equipment: Cane - single point  Functional History: Prior Function Level of Independence: Independent with assistive device(s) Comments: used cane for community ambulation, daughters helped his wife and did heavier houswork, cooking, cleaning.  Pt was independent in bathing, dreesing, and toileting.  He doesn't drive due to baseline visual deficits (left eye worse than right).  Functional Status:  Mobility: Bed Mobility Overal bed mobility: Needs Assistance Bed Mobility: Supine to Sit Supine to sit: Min assist General bed mobility comments: Min hand held assist to pull to sitting.  Extra time needed to complete the task. Transfers Overall transfer level: Needs assistance Equipment used: Rolling walker (2 wheeled) Transfers: Sit to/from Stand Sit to Stand: Min assist General transfer comment: Min assist to support trunk during transition to stand.  verbal cues for safe hand placement.  Ambulation/Gait Ambulation/Gait assistance: Min assist Ambulation Distance (Feet): 150 Feet Assistive device: Rolling walker (2 wheeled) Gait Pattern/deviations: Step-through pattern, Decreased step length - left, Decreased dorsiflexion - left, Steppage General Gait Details: pt dragging left foot as he fatigues, no buckling noted at left knee, assist needed to steer RW due to vision deficits.  Min assist provided at trunk for balance during gait. Gait velocity: decreased Gait velocity interpretation: Below normal speed for age/gender    ADL:    Cognition: Cognition Overall Cognitive Status: No family/caregiver present to determine baseline cognitive functioning (hx lists dementia, no family present, not specifically teste) Orientation Level: Oriented X4 Cognition Arousal/Alertness: Awake/alert Behavior During Therapy: WFL for tasks assessed/performed Overall Cognitive Status: No family/caregiver present to determine baseline cognitive functioning (hx lists dementia, no family present,  not specifically teste)  Blood pressure 133/73, pulse 52, temperature 98.1 F (36.7 C), temperature source Oral, resp. rate 18, height 6\' 2"  (1.88 m), weight 98.793 kg (217 lb 12.8 oz), SpO2 98 %. Physical Exam  Nursing note and vitals reviewed. Constitutional: He is oriented to person, place, and time. He appears well-developed and well-nourished.  HENT:  Head: Normocephalic and atraumatic.  Eyes: Conjunctivae are normal.  Left ptosis.  Irregular pupils.   Neck: Normal range of motion. Neck supple.  Cardiovascular: Normal rate and regular rhythm.   Respiratory: Effort normal and breath sounds normal. No respiratory distress.  GI: Soft. Bowel sounds are normal.  Musculoskeletal: He exhibits no edema or tenderness.  Neurological: He is alert and oriented to person, place, and time. A cranial nerve deficit is present.  Right facial weakness. Mild dysphonia.  Able to follow basic commands without difficulty.  Decrease in fine motor control LLE.   Skin: Skin is warm and dry. No rash noted. No erythema.  Psychiatric: He has a normal mood and affect. His behavior is normal. Judgment and thought content normal.  Motor strength is 5/5 in the right deltoid, biceps, triceps, grip, hip flexor, knee extensor, ankle dorsiflexor 4/5 in the left hip flexor and knee extensor and ankle dorsal flexor plantar flex her left deltoid bicep tricep grip Sensation intact to  light touch bilateral upper and lower limbs Speech is with mild dysarthria Reduced finger to thumb opposition left upper extremity, reduced rapid alternating movements left upper extremity  Results for orders placed or performed during the hospital encounter of 10/17/14 (from the past 24 hour(s))  Glucose, capillary     Status: Abnormal   Collection Time: 10/18/14 11:19 AM  Result Value Ref Range   Glucose-Capillary 123 (H) 65 - 99 mg/dL  Glucose, capillary     Status: Abnormal   Collection Time: 10/18/14  4:28 PM  Result Value Ref Range    Glucose-Capillary 130 (H) 65 - 99 mg/dL  Glucose, capillary     Status: None   Collection Time: 10/18/14  9:10 PM  Result Value Ref Range   Glucose-Capillary 90 65 - 99 mg/dL  Lipid panel     Status: Abnormal   Collection Time: 10/19/14  3:00 AM  Result Value Ref Range   Cholesterol 129 0 - 200 mg/dL   Triglycerides 133 <150 mg/dL   HDL 37 (L) >40 mg/dL   Total CHOL/HDL Ratio 3.5 RATIO   VLDL 27 0 - 40 mg/dL   LDL Cholesterol 65 0 - 99 mg/dL  TSH     Status: Abnormal   Collection Time: 10/19/14  3:00 AM  Result Value Ref Range   TSH 4.554 (H) 0.350 - 4.500 uIU/mL  T4, free     Status: None   Collection Time: 10/19/14  3:00 AM  Result Value Ref Range   Free T4 0.86 0.61 - 1.12 ng/dL  Glucose, capillary     Status: None   Collection Time: 10/19/14  7:33 AM  Result Value Ref Range   Glucose-Capillary 97 65 - 99 mg/dL   Ct Head Wo Contrast  10/17/2014   CLINICAL DATA:  Acute onset of left-sided weakness and unsteady gait. Initial encounter.  EXAM: CT HEAD WITHOUT CONTRAST  TECHNIQUE: Contiguous axial images were obtained from the base of the skull through the vertex without intravenous contrast.  COMPARISON:  CT of the head performed 02/02/2010  FINDINGS: There is no evidence of acute infarction, mass lesion, or intra- or extra-axial hemorrhage on CT.  A small chronic lacunar infarct is noted at the posterior right corona radiata.  The posterior fossa, including the cerebellum, brainstem and fourth ventricle, is within normal limits. The third and lateral ventricles, and basal ganglia are unremarkable in appearance. The cerebral hemispheres demonstrate grossly normal gray-white differentiation. No mass effect or midline shift is seen.  There is no evidence of fracture; visualized osseous structures are unremarkable in appearance. Postoperative change is noted at both optic globes. The paranasal sinuses and mastoid air cells are well-aerated. No significant soft tissue abnormalities are  seen.  IMPRESSION: 1. No acute intracranial pathology seen on CT. 2. Small chronic lacunar infarct at the posterior right corona radiata, better seen than in 2011.   Electronically Signed   By: Garald Balding M.D.   On: 10/17/2014 21:12   Mr Brain Wo Contrast  10/18/2014   CLINICAL DATA:  Acute onset bilateral lower extremity weakness October 14, 2014. Symptoms recurring today with leg weakness. History of hypertension, dementia, diabetes, cancer.  EXAM: MRI HEAD WITHOUT CONTRAST  MRI CERVICAL SPINE WITHOUT CONTRAST  TECHNIQUE: Multiplanar, multiecho pulse sequences of the brain and surrounding structures, and cervical spine, to include the craniocervical junction and cervicothoracic junction, were obtained without intravenous contrast.  COMPARISON:  None.  CT of the head October 17, 2014  FINDINGS: MRI HEAD FINDINGS  Multiple small foci of confluent acute ischemia, measuring 23 x 18 mm within RIGHT corona radiata, extending into the posterior limb of the RIGHT internal capsule. Corresponding slightly decreased ADC values and, FLAIR T2 hyperintense signal. No susceptibility artifact to suggest hemorrhage. Ventricles and sulci are normal for patient's age. Minimal white matter T2 hyperintensities exclusive of the aforementioned abnormality consistent with chronic small vessel ischemic disease, less than expected for age. No midline shift, mass effect or mass lesions.  No abnormal extra-axial fluid collections. Status post bilateral ocular lens implants and, RIGHT scleral banding. Trace paranasal sinus mucosal thickening without air-fluid levels. The mastoid air cells are well aerated. No abnormal sellar expansion. No cerebellar tonsillar ectopia. No suspicious calvarial bone marrow signal.  MR CERVICAL SPINE FINDINGS  Cervical vertebral bodies intact and aligned and maintenance of cervical lordosis. Mild to moderate C5-6 disc height loss, mild at C3-4. Decreased T2 signal within all cervical disc consistent with  moderate desiccation. Moderate chronic discogenic endplate changes F5-7 and C6-7, mild at C3-4. No STIR signal abnormality to suggest acute osseous process.  Cervical spinal cord is normal morphology and signal characteristics from the cervical medullary junction to level of T3-4, the most caudal well visualized level. Craniocervical junction is intact. Mild symmetric paraspinal muscle atrophy.  Level by level evaluation (moderately motion degraded axial T2):  C2-3: No disc bulge. Moderate LEFT facet arthropathy. No canal stenosis or neural foraminal narrowing.  C3-4: Small broad-based disc bulge/central disc protrusion, uncovertebral hypertrophy. Mild to moderate bilateral facet arthropathy. Mild canal stenosis. Severe bilateral neural foraminal narrowing.  C4-5: Approximate 3 mm RIGHT central disc protrusion deforms the ventral spinal cord. Uncovertebral hypertrophy and mild facet arthropathy. Mild to moderate canal stenosis. Moderate RIGHT greater than LEFT neural foraminal narrowing.  C5-6: Small broad-based disc bulge and tiny RIGHT central disc protrusion contacts the ventral spinal cord. Uncovertebral hypertrophy. Mild canal stenosis. Severe bilateral neural foraminal narrowing.  C6-7: Small broad-based disc bulge, uncovertebral hypertrophy. No canal stenosis. Mild facet arthropathy. Mild bilateral neural foraminal narrowing.  C7-T1: No disc bulge, canal stenosis nor neural foraminal narrowing. Mild facet arthropathy.  IMPRESSION: MRI HEAD: Patchy area of acute ischemia RIGHT corona radiata extending into the posterior limb of the RIGHT internal capsule.  Otherwise unremarkable MRI of the brain for age.  MR CERVICAL SPINE: Motion degraded examination. No acute fracture or malalignment.  Degenerative change of the cervical spine. Mild to moderate canal stenosis C4-5, mild at C3-4 and C5-6.  Neural foraminal narrowing C3-4 thru C6-7: Severe bilaterally at C3-4 and C5-6.   Electronically Signed   By: Elon Alas M.D.   On: 10/18/2014 03:17   Mr Cervical Spine Wo Contrast  10/18/2014   CLINICAL DATA:  Acute onset bilateral lower extremity weakness October 14, 2014. Symptoms recurring today with leg weakness. History of hypertension, dementia, diabetes, cancer.  EXAM: MRI HEAD WITHOUT CONTRAST  MRI CERVICAL SPINE WITHOUT CONTRAST  TECHNIQUE: Multiplanar, multiecho pulse sequences of the brain and surrounding structures, and cervical spine, to include the craniocervical junction and cervicothoracic junction, were obtained without intravenous contrast.  COMPARISON:  None.  CT of the head October 17, 2014  FINDINGS: MRI HEAD FINDINGS  Multiple small foci of confluent acute ischemia, measuring 23 x 18 mm within RIGHT corona radiata, extending into the posterior limb of the RIGHT internal capsule. Corresponding slightly decreased ADC values and, FLAIR T2 hyperintense signal. No susceptibility artifact to suggest hemorrhage. Ventricles and sulci are normal for patient's age. Minimal white matter T2 hyperintensities  exclusive of the aforementioned abnormality consistent with chronic small vessel ischemic disease, less than expected for age. No midline shift, mass effect or mass lesions.  No abnormal extra-axial fluid collections. Status post bilateral ocular lens implants and, RIGHT scleral banding. Trace paranasal sinus mucosal thickening without air-fluid levels. The mastoid air cells are well aerated. No abnormal sellar expansion. No cerebellar tonsillar ectopia. No suspicious calvarial bone marrow signal.  MR CERVICAL SPINE FINDINGS  Cervical vertebral bodies intact and aligned and maintenance of cervical lordosis. Mild to moderate C5-6 disc height loss, mild at C3-4. Decreased T2 signal within all cervical disc consistent with moderate desiccation. Moderate chronic discogenic endplate changes N9-8 and C6-7, mild at C3-4. No STIR signal abnormality to suggest acute osseous process.  Cervical spinal cord is normal  morphology and signal characteristics from the cervical medullary junction to level of T3-4, the most caudal well visualized level. Craniocervical junction is intact. Mild symmetric paraspinal muscle atrophy.  Level by level evaluation (moderately motion degraded axial T2):  C2-3: No disc bulge. Moderate LEFT facet arthropathy. No canal stenosis or neural foraminal narrowing.  C3-4: Small broad-based disc bulge/central disc protrusion, uncovertebral hypertrophy. Mild to moderate bilateral facet arthropathy. Mild canal stenosis. Severe bilateral neural foraminal narrowing.  C4-5: Approximate 3 mm RIGHT central disc protrusion deforms the ventral spinal cord. Uncovertebral hypertrophy and mild facet arthropathy. Mild to moderate canal stenosis. Moderate RIGHT greater than LEFT neural foraminal narrowing.  C5-6: Small broad-based disc bulge and tiny RIGHT central disc protrusion contacts the ventral spinal cord. Uncovertebral hypertrophy. Mild canal stenosis. Severe bilateral neural foraminal narrowing.  C6-7: Small broad-based disc bulge, uncovertebral hypertrophy. No canal stenosis. Mild facet arthropathy. Mild bilateral neural foraminal narrowing.  C7-T1: No disc bulge, canal stenosis nor neural foraminal narrowing. Mild facet arthropathy.  IMPRESSION: MRI HEAD: Patchy area of acute ischemia RIGHT corona radiata extending into the posterior limb of the RIGHT internal capsule.  Otherwise unremarkable MRI of the brain for age.  MR CERVICAL SPINE: Motion degraded examination. No acute fracture or malalignment.  Degenerative change of the cervical spine. Mild to moderate canal stenosis C4-5, mild at C3-4 and C5-6.  Neural foraminal narrowing C3-4 thru C6-7: Severe bilaterally at C3-4 and C5-6.   Electronically Signed   By: Elon Alas M.D.   On: 10/18/2014 03:17    Assessment/Plan: Diagnosis: Acute left hemiparesis resulting from right corona radiata and posterior limb internal capsule Thrombotic infarct onset  10/17/2014, Causing decline in self-care and mobility 1. Does the need for close, 24 hr/day medical supervision in concert with the patient's rehab needs make it unreasonable for this patient to be served in a less intensive setting? Yes 2. Co-Morbidities requiring supervision/potential complications: Diabetes, hypertension, visual impairment related to glaucoma and cataracts 3. Due to bladder management, bowel management, safety, skin/wound care, disease management, medication administration, pain management and patient education, does the patient require 24 hr/day rehab nursing? Yes 4. Does the patient require coordinated care of a physician, rehab nurse, PT (1-2 hrs/day, 5 days/week), OT (1-2 hrs/day, 5 days/week) and SLP (0.5-1 hrs/day, 5 days/week) to address physical and functional deficits in the context of the above medical diagnosis(es)? Yes Addressing deficits in the following areas: balance, endurance, locomotion, strength, transferring, bowel/bladder control, bathing, dressing, feeding, grooming, toileting and speech 5. Can the patient actively participate in an intensive therapy program of at least 3 hrs of therapy per day at least 5 days per week? Yes 6. The potential for patient to make measurable gains while on  inpatient rehab is excellent 7. Anticipated functional outcomes upon discharge from inpatient rehab are modified independent and supervision  with PT, modified independent and supervision with OT, modified independent and supervision with SLP. 8. Estimated rehab length of stay to reach the above functional goals is: 7-10d 9. Does the patient have adequate social supports and living environment to accommodate these discharge functional goals? Yes 10. Anticipated D/C setting: Home 11. Anticipated post D/C treatments: [facility] therapy 12. Overall Rehab/Functional Prognosis: excellent  RECOMMENDATIONS: This patient's condition is appropriate for continued rehabilitative care in the  following setting: CIR Patient has agreed to participate in recommended program. Yes Note that insurance prior authorization may be required for reimbursement for recommended care.  Comment: Patient's wife is in the hospital. He has 2 daughters in [place]    10/19/2014

## 2014-10-19 NOTE — Progress Notes (Signed)
STROKE TEAM PROGRESS NOTE   SUBJECTIVE (INTERVAL HISTORY) No family is at the bedside.  Overall he feels his condition is gradually improving. Still has mild left lower extremity weakness but much improved. Passed swallow screen, asking for breakfast.  OBJECTIVE Temp:  [97.4 F (36.3 C)-98.1 F (36.7 C)] 97.6 F (36.4 C) (08/17 1145) Pulse Rate:  [50-66] 52 (08/17 0700) Cardiac Rhythm:  [-] Normal sinus rhythm (08/17 0734) Resp:  [14-23] 18 (08/17 0700) BP: (120-153)/(60-93) 122/73 mmHg (08/17 1049) SpO2:  [95 %-100 %] 98 % (08/17 0503) Weight:  [217 lb 12.8 oz (98.793 kg)] 217 lb 12.8 oz (98.793 kg) (08/17 0503)   Recent Labs Lab 10/18/14 1119 10/18/14 1628 10/18/14 2110 10/19/14 0733 10/19/14 1133  GLUCAP 123* 130* 90 97 100*    Recent Labs Lab 10/14/14 0745 10/17/14 1753 10/17/14 1811 10/18/14 0610 10/19/14 0953  NA 137 138 141 138 138  K 3.0* 3.9 3.9 3.4* 3.8  CL 104 102 102 104 104  CO2 23 27  --  25 27  GLUCOSE 154* 112* 112* 125* 118*  BUN 10 11 14 11 13   CREATININE 1.09 1.17 1.00 1.19 1.21  CALCIUM 9.5 10.1  --  9.2 9.7    Recent Labs Lab 10/14/14 0745 10/17/14 1753 10/19/14 0953  AST 18 22 16   ALT 15* 17 16*  ALKPHOS 62 67 63  BILITOT 0.8 0.5 0.7  PROT 7.8 8.0 6.9  ALBUMIN 3.9 4.0 3.4*    Recent Labs Lab 10/14/14 0745 10/17/14 1753 10/17/14 1811 10/18/14 0610  WBC 5.6 5.8  --  5.3  NEUTROABS 3.7 3.4  --   --   HGB 13.9 14.8 17.0 14.2  HCT 41.1 43.4 50.0 42.5  MCV 85.1 84.9  --  85.0  PLT 201 242  --  202    Recent Labs Lab 10/14/14 0745  TROPONINI <0.03    Recent Labs  10/17/14 1753  LABPROT 14.0  INR 1.06    Recent Labs  10/19/14 1616  COLORURINE YELLOW  LABSPEC 1.011  PHURINE 5.5  GLUCOSEU NEGATIVE  HGBUR NEGATIVE  BILIRUBINUR NEGATIVE  KETONESUR NEGATIVE  PROTEINUR NEGATIVE  UROBILINOGEN 0.2  NITRITE NEGATIVE  LEUKOCYTESUR NEGATIVE       Component Value Date/Time   CHOL 129 10/19/2014 0300   TRIG 133  10/19/2014 0300   HDL 37* 10/19/2014 0300   CHOLHDL 3.5 10/19/2014 0300   VLDL 27 10/19/2014 0300   LDLCALC 65 10/19/2014 0300   Lab Results  Component Value Date   HGBA1C 6.6* 10/18/2014   No results found for: LABOPIA, COCAINSCRNUR, LABBENZ, AMPHETMU, THCU, LABBARB  No results for input(s): ETH in the last 168 hours.  I have personally reviewed the radiological images below and agree with the radiology interpretations.  Dg Chest 2 View 10/14/2014   IMPRESSION: No acute cardiopulmonary disease.      Ct Head Wo Contrast 10/17/2014   IMPRESSION: 1. No acute intracranial pathology seen on CT. 2. Small chronic lacunar infarct at the posterior right corona radiata, better seen than in 2011.      Mr Brain Wo Contrast 10/18/2014    Patchy area of acute ischemia RIGHT corona radiata extending into the posterior limb of the RIGHT internal capsule.  Otherwise unremarkable MRI of the brain for age.    Mr Cervical Spine Wo Contrast Motion degraded examination. No acute fracture or malalignment.  Degenerative change of the cervical spine. Mild to moderate canal stenosis C4-5, mild at C3-4 and C5-6.  Neural foraminal  narrowing C3-4 thru C6-7: Severe bilaterally at C3-4 and C5-6.    Mr Thoracic Spine Wo Contrast 10/14/2014   No thoracic spinal stenosis or acute abnormality. Mildly exaggerated thoracic kyphosis.   Mr Lumbar Spine Wo Contrast 1. Heterogeneous marrow with through the thoracolumbar spine. In this patient with presumed history of prostate cancer and prostatectomy, correlation with PSA is recommended. 2. The congenitally narrow spinal canal with superimposed degenerative disease. Posterior epidural lipomatosis. 3. Patient refused further imaging and only sagittal T1 and inversion recovery images were obtained. 4. Moderate lumbar spondylosis for age.  Carotid Doppler  - pending  2D Echocardiogram  - Normal LV size with mild LV hypertrophy. EF 55%. Mildly dilated RV with mildly decreased  systolic function. Aortic sclerosis without significant stenosis.  PHYSICAL EXAM  Temp:  [97.4 F (36.3 C)-98.1 F (36.7 C)] 97.6 F (36.4 C) (08/17 1145) Pulse Rate:  [50-66] 52 (08/17 0700) Resp:  [14-23] 18 (08/17 0700) BP: (120-153)/(60-93) 122/73 mmHg (08/17 1049) SpO2:  [95 %-100 %] 98 % (08/17 0503) Weight:  [217 lb 12.8 oz (98.793 kg)] 217 lb 12.8 oz (98.793 kg) (08/17 0503)  General - Well nourished, well developed, in no apparent distress.  Ophthalmologic - fundi not visualized due to eye movement.  Cardiovascular - Regular rate and rhythm.  Mental Status -  Level of arousal and orientation to time, place, and person were intact. Language including expression, naming, repetition, comprehension was assessed and found intact, but mild dysarthria. Fund of Knowledge was assessed and was intact.  Cranial Nerves II - XII - II - left eye light perception, right eye counting fingers, s/p b/l glaucoma surgery. III, IV, VI - bilateral surgical pupils, bilateral lateral gaze not complete. V - Facial sensation intact bilaterally. VII - left nasolabial fold flattening. VIII - Hearing & vestibular intact bilaterally. X - Palate elevates symmetrically. XI - Chin turning & shoulder shrug intact bilaterally. XII - Tongue protrusion intact.  Motor Strength - The patient's strength was normal in all extremities except LLE 4+/5 proximal and distal and pronator drift was absent.  Bulk was normal and fasciculations were absent.   Motor Tone - Muscle tone was assessed at the neck and appendages and was normal.  Reflexes - The patient's reflexes were symmetrical in all extremities and he had no pathological reflexes.  Sensory - Light touch, temperature/pinprick were assessed and were symmetrical.    Coordination - The patient had normal movements in the handswith no ataxia or dysmetria.  Tremor was absent.  Gait and Station - not tested due to safety  concerns.   ASSESSMENT/PLAN [name] is a 81 y.o. male with history of cognitive impairment, HTN, DM, HLD, CAD status post stent, prostate cancer status post surgery admitted for left lower extremity weakness and off balance for 3 days. Symptoms gradually improving.    Stroke:  right corona radiata small infarct likely secondary to small vessel disease source  MRI  Right CR small infarct  Carotid Doppler  pending  2D Echo  unremarkable  LDL 65  HgbA1c 6.6  Lovenox for VTE prophylaxis  Diet Heart Room service appropriate?: Yes; Fluid consistency:: Thin   aspirin 81 mg orally every day and clopidogrel 75 mg orally every day prior to admission, now on aspirin 81 mg orally every day and clopidogrel 75 mg orally every day. Continued dural antiplatelet for stroke and cardiac prevention.  Patient counseled to be compliant with his antithrombotic medications  Ongoing aggressive stroke risk factor management  Therapy  recommendations:  Pending  Disposition:  Pending  Diabetes  HgbA1c pending goal < 7.0  Controlled  CBG monitoring  SSI  DM education  Hypertension  Home meds:   Amlodipine Permissive hypertension (OK if <220/120) for 24-48 hours post stroke and then gradually normalized within 5-7 days. Currently on amlodipine  Stable  Patient counseled to be compliant with his blood pressure medications  Hyperlipidemia  Home meds:  Lipitor 20   Currently on Lipitor 20  LDL pending, goal < 70  Continue statin at discharge  Other Stroke Risk Factors  Advanced age  Coronary artery disease status post stent  Other Active Problems  Cognitive impairment  Hypokalemia  Elevated TSH but normal FT4  Other Pertinent History  Bilateral glaucoma surgery  Left eye legally blind  Hospital day # 1  Rosalin Hawking, MD PhD Stroke Neurology 10/19/2014 4:47 PM    To contact Stroke Continuity provider, please refer to [email or web address] After hours, contact  General Neurology

## 2014-10-19 NOTE — Progress Notes (Signed)
I assisted pt from bathroom to recliner and requested CNA to assist pt in cleaning due to incontinence of bladder. Needed assisting for thorough cleaning after bathroom issues. I contacted pt's daughter, Donnald Garre, by phone. Made her aware of limited bed availability for CIR so recommended SNF. SW and RN CM are aware.Pt's wife is a pt on 6e03.  [phone]

## 2014-10-20 ENCOUNTER — Inpatient Hospital Stay (HOSPITAL_COMMUNITY): Payer: Medicare Other

## 2014-10-20 DIAGNOSIS — I639 Cerebral infarction, unspecified: Secondary | ICD-10-CM

## 2014-10-20 LAB — GLUCOSE, CAPILLARY
Glucose-Capillary: 113 mg/dL — ABNORMAL HIGH (ref 65–99)
Glucose-Capillary: 139 mg/dL — ABNORMAL HIGH (ref 65–99)

## 2014-10-20 LAB — T3: T3 TOTAL: 108 ng/dL (ref 71–180)

## 2014-10-20 MED ORDER — ACETAMINOPHEN 325 MG PO TABS: 650.0000 mg | ORAL_TABLET | Freq: Four times a day (QID) | ORAL | Status: DC | PRN

## 2014-10-20 MED ORDER — PREDNISOLONE ACETATE 1 % OP SUSP
1.0000 [drp] | Freq: Every day | OPHTHALMIC | Status: DC
  Filled 2014-10-20: qty 1

## 2014-10-20 NOTE — Care Management Important Message (Signed)
Important Message  Patient Details  Name: Trevor Andrews MRN: [id number] Date of Birth: 22-Jan-1930   Medicare Important Message Given:  Yes-second notification given    Pricilla Handler 10/20/2014, 2:09 PM

## 2014-10-20 NOTE — Clinical Social Work Note (Signed)
Clinical Social Work Assessment  Patient Details  Name: Trevor Andrews MRN: [id number] Date of Birth: March 18, 1929  Date of referral:  10/20/14               Reason for consult:  Facility Placement                Permission sought to share information with:  Facility Art therapist granted to share information::  Yes, Verbal Permission Granted  Name::        Agency::  SNFs for referral purpsoses  Relationship::     Contact Information:     Housing/Transportation Living arrangements for the past 2 months:  Single Family Home Source of Information:  Patient Patient Interpreter Needed:  None Criminal Activity/Legal Involvement Pertinent to Current Situation/Hospitalization:  No - Comment as needed Significant Relationships:  Adult Children, Spouse Lives with:  Spouse Do you feel safe going back to the place where you live?  Yes Need for family participation in patient care:  No (Coment)  Care giving concerns:  CSW notified pt potentially ready for dc today and recommendation is for SNF   Social Worker assessment / plan:  CSW visited pt room to speak with pt about recommendation. Pt informed CSW he is agreeable to ST rehab at [facility]. Pt has not been to SNF before but possibly interested in [facility] due to location. CSW explained SNF referral process. Pt is agreeable to referral being sent to all [facility] so he can know all of his options. Pt was made aware of potential for dc today. Pt has no concerns at this time.  Employment status:  Retired Nurse, adult PT Recommendations:  [place] / Referral to community resources:  [place]  Patient/Family's Response to care:  Pt agreeable to plan of care  Patient/Family's Understanding of and Emotional Response to Diagnosis, Current Treatment, and Prognosis:  Pt with good understanding of his condition. Pt in good spirits and with an appropriate  emotional response.  Emotional Assessment Appearance:  Appears stated age, Well-Groomed Attitude/Demeanor/Rapport:  Other (Cooperative) Affect (typically observed):  Pleasant, Appropriate Orientation:  Oriented to Self, Oriented to Place, Oriented to  Time, Oriented to Situation Alcohol / Substance use:  Not Applicable Psych involvement (Current and /or in the community):  No (Comment)  Discharge Needs  Concerns to be addressed:  Discharge Planning Concerns Readmission within the last 30 days:  No Current discharge risk:  Dependent with Mobility Barriers to Discharge:  No Barriers Identified   [place], [place]

## 2014-10-20 NOTE — Progress Notes (Signed)
[facility] PROGRESS NOTE  Trevor Andrews KZL:[id number] DOB: 18-May-1929 DOA: 10/17/2014 PCP: Elizabeth Palau, MD  Assessment/Plan: 81y/o male, legally blind, with PMH of HTN, DM,. HPL, CAD h/o stent, prostate cancer status post prostatectomy, mild dementia presented to the ED for left lower leg weakness and falls. He was seen in the ED on 8/12 due to acute bilateral lower extremity weakness for which he had MRI of the thoracic spine which was negative (unable to complete lumbar spine due to claustrophobia) and was discharged home. He presented again on 8/15: Head CT on admission was unremarkable however MRI of the brain showed acute infarct lower right posterior corona radiata.   1. Acute right ischemic stroke. MRI: Patchy area of infarct over right coronary data extending to the posterior limb of the right internal capsule on MRI, which explains his left-sided weakness. -Patient is seen by neurology who recommended to cont aspirin 81 mg orally every day and clopidogrel 75 mg orally every day. 2-D echo: LVEF 55%. LDL-65. HA1c-6.6. PT/OT recommended CIR. Neurology following. 2. Coronary artery disease with stent placement. Continue aspirin/Plavix and Lipitor. 3. Diabetes mellitus without complications. Monitor on sliding scale insulin. Hemoglobin A1c 6.6 4. Dementia Mild admitted impairment. 5. BPH Continue Flomax  D/c plans: awaiting CIR vs SNF  DVT prophylaxis: Subcutaneous Lovenox  Diet: Heart healthy  Code Status: full Family Communication:  D/w patient, RN (indicate person spoken with, relationship, and if by phone, the number) Disposition Plan: pend SNF vs CIR   Consultants:  neurology   Procedures:  MRI brain/ cervical spine  2 echo  Carotid doppler  Antibiotics:  none (indicate start date, and stop date if known)  HPI/Subjective: Alert, no distress. Reports improvement   Objective: Filed Vitals:   10/20/14 0440  BP: 137/79  Pulse:   Temp: 98.2 F  (36.8 C)  Resp: 22    Intake/Output Summary (Last 24 hours) at 10/20/14 0801 Last data filed at 10/20/14 0443  Gross per 24 hour  Intake    900 ml  Output   3950 ml  Net  -3050 ml   Filed Weights   10/17/14 1736 10/18/14 0517 10/19/14 0503  Weight: 105.688 kg (233 lb) 98.567 kg (217 lb 4.8 oz) 98.793 kg (217 lb 12.8 oz)    Exam:   General:  Alert, no distress   Cardiovascular: s1,s2 rrr  Respiratory: CTA BL  Abdomen: soft, nt,nd   Musculoskeletal: no leg edema   Data Reviewed: Basic Metabolic Panel:  Recent Labs Lab 10/14/14 0745 10/17/14 1753 10/17/14 1811 10/18/14 0610 10/19/14 0953  NA 137 138 141 138 138  K 3.0* 3.9 3.9 3.4* 3.8  CL 104 102 102 104 104  CO2 23 27  --  25 27  GLUCOSE 154* 112* 112* 125* 118*  BUN 10 11 14 11 13   CREATININE 1.09 1.17 1.00 1.19 1.21  CALCIUM 9.5 10.1  --  9.2 9.7   Liver Function Tests:  Recent Labs Lab 10/14/14 0745 10/17/14 1753 10/19/14 0953  AST 18 22 16   ALT 15* 17 16*  ALKPHOS 62 67 63  BILITOT 0.8 0.5 0.7  PROT 7.8 8.0 6.9  ALBUMIN 3.9 4.0 3.4*   No results for input(s): LIPASE, AMYLASE in the last 168 hours. No results for input(s): AMMONIA in the last 168 hours. CBC:  Recent Labs Lab 10/14/14 0745 10/17/14 1753 10/17/14 1811 10/18/14 0610  WBC 5.6 5.8  --  5.3  NEUTROABS 3.7 3.4  --   --   HGB  13.9 14.8 17.0 14.2  HCT 41.1 43.4 50.0 42.5  MCV 85.1 84.9  --  85.0  PLT 201 242  --  202   Cardiac Enzymes:  Recent Labs Lab 10/14/14 0745  TROPONINI <0.03   BNP (last 3 results) No results for input(s): BNP in the last 8760 hours.  ProBNP (last 3 results) No results for input(s): PROBNP in the last 8760 hours.  CBG:  Recent Labs Lab 10/19/14 0733 10/19/14 1133 10/19/14 1633 10/19/14 2107 10/20/14 0736  GLUCAP 97 100* 92 108* 113*    Recent Results (from the past 240 hour(s))  Urine culture     Status: None   Collection Time: 10/14/14  9:03 AM  Result Value Ref Range Status    Specimen Description URINE, CLEAN CATCH  Final   Special Requests NONE  Final   Culture   Final    7,000 COLONIES/mL INSIGNIFICANT GROWTH Performed at [facility]    Report Status 10/15/2014 FINAL  Final     Studies: No results found.  Scheduled Meds: . allopurinol  200 mg Oral Daily  . amLODipine  5 mg Oral Daily  . aspirin EC  81 mg Oral Daily  . atorvastatin  20 mg Oral QHS  . brimonidine  1 drop Both Eyes TID  . calcium-vitamin D  1 tablet Oral BID  . cholecalciferol  1,000 Units Oral Daily  . clopidogrel  75 mg Oral Daily  . dorzolamide-timolol  2 drop Both Eyes BID  . enoxaparin (LOVENOX) injection  40 mg Subcutaneous Q24H  . prednisoLONE acetate  1 drop Right Eye Daily  . sodium chloride  3 mL Intravenous Q12H  . tamsulosin  0.4 mg Oral QHS   Continuous Infusions:   Principal Problem:   Acute ischemic stroke Active Problems:   Gait apraxia   Hypertension   Hypercholesteremia   Dementia   History of coronary artery stent placement   Gait abnormality   Stroke with cerebral ischemia   Cerebral infarction due to thrombosis of right middle cerebral artery    Time spent: >35 minutes     Kinnie Feil  [facility] Pager [phone]. If 7PM-7AM, please contact night-coverage at [email or web address], password [facility] 10/20/2014, 8:01 AM  LOS: 2 days

## 2014-10-20 NOTE — Care Management Note (Signed)
Case Management Note  Patient Details  Name: Trevor Andrews MRN: [id number] Date of Birth: [birth year]  Subjective/Objective:   Pt d/c today to [facility] SNF.                  Action/Plan: CSW assisted with disposition needs.   Expected Discharge Date:  10/20/14               Expected Discharge Plan:  Skilled Nursing Facility  In-House Referral:  Clinical Social Work  Discharge planning Services  CM Consult  Post Acute Care Choice:  NA Choice offered to:  NA  DME Arranged:  N/A DME Agency:  NA  HH Arranged:  NA HH Agency:  NA  Status of Service:  Completed, signed off  Medicare Important Message Given:    Date Medicare IM Given:    Medicare IM give by:    Date Additional Medicare IM Given:    Additional Medicare Important Message give by:     If discussed at [place] of Stay Meetings, dates discussed:    Additional Comments:  Bethena Roys, RN 10/20/2014, 1:56 PM

## 2014-10-20 NOTE — Clinical Social Work Placement (Signed)
   CLINICAL SOCIAL WORK PLACEMENT  NOTE  Date:  10/20/2014  Patient Details  Name: Trevor Andrews MRN: [id number] Date of Birth: 03/08/1929  Clinical Social Work is seeking post-discharge placement for this patient at the [place] level of care (*CSW will initial, date and re-position this form in  chart as items are completed):  Yes   Patient/family provided with [place] Work Department's list of facilities offering this level of care within the geographic area requested by the patient (or if unable, by the patient's family).  Yes   Patient/family informed of their freedom to choose among providers that offer the needed level of care, that participate in Medicare, Medicaid or managed care program needed by the patient, have an available bed and are willing to accept the patient.  Yes   Patient/family informed of [facility]'s ownership interest in [facility] and [facility], as well as of the fact that they are under no obligation to receive care at these facilities.  PASRR submitted to EDS on 10/20/14     PASRR number received on 10/20/14     Existing PASRR number confirmed on       FL2 transmitted to all facilities in geographic area requested by pt/family on 10/20/14     FL2 transmitted to all facilities within larger geographic area on       Patient informed that his/her managed care company has contracts with or will negotiate with certain facilities, including the following:        Yes   Patient/family informed of bed offers received.  Patient [facility] bed at [facility]     Physician recommends and patient chooses bed at      Patient to be transferred to [place] on 10/20/14.  Patient to be transferred to facility by [facility]     Patient family notified on 10/20/14 of transfer.  Name of family member notified:  Pt & pt daughter notified     PHYSICIAN       Additional Comment:     _______________________________________________ Berton Mount, [place]

## 2014-10-20 NOTE — Progress Notes (Signed)
CSW (Clinical Education officer, museum) prepared pt dc packet and placed with shadow chart. CSW arranged non-emergent ambulance transport for 1:30pm. Pt, pt family, pt nurse, and facility informed. CSW signing off.  [place], [place]

## 2014-10-20 NOTE — Discharge Summary (Signed)
Physician Discharge Summary  Trevor Andrews:[id number] DOB: 09/14/1929 DOA: 10/17/2014  PCP: Elizabeth Palau, MD  Admit date: 10/17/2014 Discharge date: 10/20/2014  Time spent: >35 minutes  Recommendations for Outpatient Follow-up:  PCP in 1-2 weeks after discharge  Neurology in 3-4 weeks   Discharge Diagnoses:  Principal Problem:   Acute ischemic stroke Active Problems:   Gait apraxia   Hypertension   Hypercholesteremia   Dementia   History of coronary artery stent placement   Gait abnormality   Stroke with cerebral ischemia   Cerebral infarction due to thrombosis of right middle cerebral artery   Discharge Condition: stable   Diet recommendation: low sodium, DM  Filed Weights   10/17/14 1736 10/18/14 0517 10/19/14 0503  Weight: 105.688 kg (233 lb) 98.567 kg (217 lb 4.8 oz) 98.793 kg (217 lb 12.8 oz)    History of present illness:  81y/o male, legally blind, with PMH of HTN, DM,. HPL, CAD h/o stent, prostate cancer status post prostatectomy, mild dementia presented to the ED for left lower leg weakness and falls. He was seen in the ED on 8/12 due to acute bilateral lower extremity weakness for which he had MRI of the thoracic spine which was negative (unable to complete lumbar spine due to claustrophobia) and was discharged home. He presented again on 8/15: Head CT on admission was unremarkable however MRI of the brain showed acute infarct lower right posterior corona radiata.   Hospital Course:  1. Acute right ischemic stroke. MRI: Patchy area of infarct over right coronary data extending to the posterior limb of the right internal capsule on MRI, which explains his left-sided weakness. -symptoms->resolving. Patient is seen by neurology who recommended to cont aspirin 81 mg orally every day and clopidogrel 75 mg orally every day. 2-D echo: LVEF 55%. LDL-65. HA1c-6.6. PT/OT recommended SNF 2. Coronary artery disease with stent placement. Continue aspirin/Plavix and  Lipitor. 3. Diabetes mellitus without complications. Hemoglobin A1c 6.6 4. Dementia Mild admitted impairment. 5. BPH Continue Flomax  D/c plans: SNF today   Procedures:  Echo: Impressions:  - Normal LV size with mild LV hypertrophy. EF 55%. Mildly dilated RV with mildly decreased systolic function. Aortic sclerosis without significant stenosis.  (i.e. Studies not automatically included, echos, thoracentesis, etc; not x-rays)  Consultations:  Neurology   Discharge Exam: Filed Vitals:   10/20/14 0440  BP: 137/79  Pulse:   Temp: 98.2 F (36.8 C)  Resp: 22    General: alert, oriented. No distress  Cardiovascular: s1,s2 rrr Respiratory: CTA BL  Discharge Instructions  Discharge Instructions    Diet - low sodium heart healthy    Complete by:  As directed      Discharge instructions    Complete by:  As directed   Please follow up with primary care doctor in 1-2 weeks after discharge     Increase activity slowly    Complete by:  As directed             Medication List    TAKE these medications        acetaminophen 325 MG tablet  Commonly known as:  TYLENOL  Take 2 tablets (650 mg total) by mouth every 6 (six) hours as needed for mild pain (or Fever >/= 101).     allopurinol 100 MG tablet  Commonly known as:  ZYLOPRIM  Take 200 mg by mouth daily.     amLODipine 10 MG tablet  Commonly known as:  NORVASC  Take 5 mg by mouth  daily.     aspirin EC 81 MG tablet  Take 81 mg by mouth daily.     atorvastatin 40 MG tablet  Commonly known as:  LIPITOR  Take 20 mg by mouth at bedtime.     CALCIUM 600+D 600-400 MG-UNIT per tablet  Generic drug:  Calcium Carbonate-Vitamin D  Take 1 tablet by mouth 2 (two) times daily.     cholecalciferol 1000 UNITS tablet  Commonly known as:  VITAMIN D  Take 1,000 Units by mouth daily.     clopidogrel 75 MG tablet  Commonly known as:  PLAVIX  Take 75 mg by mouth daily.     dorzolamide-timolol 22.3-6.8 MG/ML ophthalmic  solution  Commonly known as:  COSOPT  Place 2 drops into both eyes Twice daily.     polyvinyl alcohol 1.4 % ophthalmic solution  Commonly known as:  LIQUIFILM TEARS  Place 1 drop into both eyes 3 (three) times daily as needed for dry eyes.     prednisoLONE acetate 1 % ophthalmic suspension  Commonly known as:  PRED FORTE  Place 1 drop into the right eye daily.     tamsulosin 0.4 MG Caps capsule  Commonly known as:  FLOMAX  Take 0.4 mg by mouth at bedtime.       No Known Allergies    The results of significant diagnostics from this hospitalization (including imaging, microbiology, ancillary and laboratory) are listed below for reference.    Significant Diagnostic Studies: Dg Chest 2 View  10/14/2014   CLINICAL DATA:  Increasing weakness, dizziness over the last 2 days with some shortness of breath.  EXAM: CHEST  2 VIEW  COMPARISON:  09/03/2013.  FINDINGS: Cardiac silhouette is mildly enlarged. No mediastinal or hilar masses or evidence of adenopathy.  Clear lungs.  No pleural effusion or pneumothorax.  Bony thorax is intact.  IMPRESSION: No acute cardiopulmonary disease.   Electronically Signed   By: Lajean Manes M.D.   On: 10/14/2014 08:05   Ct Head Wo Contrast  10/17/2014   CLINICAL DATA:  Acute onset of left-sided weakness and unsteady gait. Initial encounter.  EXAM: CT HEAD WITHOUT CONTRAST  TECHNIQUE: Contiguous axial images were obtained from the base of the skull through the vertex without intravenous contrast.  COMPARISON:  CT of the head performed 02/02/2010  FINDINGS: There is no evidence of acute infarction, mass lesion, or intra- or extra-axial hemorrhage on CT.  A small chronic lacunar infarct is noted at the posterior right corona radiata.  The posterior fossa, including the cerebellum, brainstem and fourth ventricle, is within normal limits. The third and lateral ventricles, and basal ganglia are unremarkable in appearance. The cerebral hemispheres demonstrate grossly  normal gray-white differentiation. No mass effect or midline shift is seen.  There is no evidence of fracture; visualized osseous structures are unremarkable in appearance. Postoperative change is noted at both optic globes. The paranasal sinuses and mastoid air cells are well-aerated. No significant soft tissue abnormalities are seen.  IMPRESSION: 1. No acute intracranial pathology seen on CT. 2. Small chronic lacunar infarct at the posterior right corona radiata, better seen than in 2011.   Electronically Signed   By: Garald Balding M.D.   On: 10/17/2014 21:12   Mr Brain Wo Contrast  10/18/2014   CLINICAL DATA:  Acute onset bilateral lower extremity weakness October 14, 2014. Symptoms recurring today with leg weakness. History of hypertension, dementia, diabetes, cancer.  EXAM: MRI HEAD WITHOUT CONTRAST  MRI CERVICAL SPINE WITHOUT CONTRAST  TECHNIQUE:  Multiplanar, multiecho pulse sequences of the brain and surrounding structures, and cervical spine, to include the craniocervical junction and cervicothoracic junction, were obtained without intravenous contrast.  COMPARISON:  None.  CT of the head October 17, 2014  FINDINGS: MRI HEAD FINDINGS  Multiple small foci of confluent acute ischemia, measuring 23 x 18 mm within RIGHT corona radiata, extending into the posterior limb of the RIGHT internal capsule. Corresponding slightly decreased ADC values and, FLAIR T2 hyperintense signal. No susceptibility artifact to suggest hemorrhage. Ventricles and sulci are normal for patient's age. Minimal white matter T2 hyperintensities exclusive of the aforementioned abnormality consistent with chronic small vessel ischemic disease, less than expected for age. No midline shift, mass effect or mass lesions.  No abnormal extra-axial fluid collections. Status post bilateral ocular lens implants and, RIGHT scleral banding. Trace paranasal sinus mucosal thickening without air-fluid levels. The mastoid air cells are well aerated. No  abnormal sellar expansion. No cerebellar tonsillar ectopia. No suspicious calvarial bone marrow signal.  MR CERVICAL SPINE FINDINGS  Cervical vertebral bodies intact and aligned and maintenance of cervical lordosis. Mild to moderate C5-6 disc height loss, mild at C3-4. Decreased T2 signal within all cervical disc consistent with moderate desiccation. Moderate chronic discogenic endplate changes Z6-1 and C6-7, mild at C3-4. No STIR signal abnormality to suggest acute osseous process.  Cervical spinal cord is normal morphology and signal characteristics from the cervical medullary junction to level of T3-4, the most caudal well visualized level. Craniocervical junction is intact. Mild symmetric paraspinal muscle atrophy.  Level by level evaluation (moderately motion degraded axial T2):  C2-3: No disc bulge. Moderate LEFT facet arthropathy. No canal stenosis or neural foraminal narrowing.  C3-4: Small broad-based disc bulge/central disc protrusion, uncovertebral hypertrophy. Mild to moderate bilateral facet arthropathy. Mild canal stenosis. Severe bilateral neural foraminal narrowing.  C4-5: Approximate 3 mm RIGHT central disc protrusion deforms the ventral spinal cord. Uncovertebral hypertrophy and mild facet arthropathy. Mild to moderate canal stenosis. Moderate RIGHT greater than LEFT neural foraminal narrowing.  C5-6: Small broad-based disc bulge and tiny RIGHT central disc protrusion contacts the ventral spinal cord. Uncovertebral hypertrophy. Mild canal stenosis. Severe bilateral neural foraminal narrowing.  C6-7: Small broad-based disc bulge, uncovertebral hypertrophy. No canal stenosis. Mild facet arthropathy. Mild bilateral neural foraminal narrowing.  C7-T1: No disc bulge, canal stenosis nor neural foraminal narrowing. Mild facet arthropathy.  IMPRESSION: MRI HEAD: Patchy area of acute ischemia RIGHT corona radiata extending into the posterior limb of the RIGHT internal capsule.  Otherwise unremarkable MRI of  the brain for age.  MR CERVICAL SPINE: Motion degraded examination. No acute fracture or malalignment.  Degenerative change of the cervical spine. Mild to moderate canal stenosis C4-5, mild at C3-4 and C5-6.  Neural foraminal narrowing C3-4 thru C6-7: Severe bilaterally at C3-4 and C5-6.   Electronically Signed   By: Elon Alas M.D.   On: 10/18/2014 03:17   Mr Cervical Spine Wo Contrast  10/18/2014   CLINICAL DATA:  Acute onset bilateral lower extremity weakness October 14, 2014. Symptoms recurring today with leg weakness. History of hypertension, dementia, diabetes, cancer.  EXAM: MRI HEAD WITHOUT CONTRAST  MRI CERVICAL SPINE WITHOUT CONTRAST  TECHNIQUE: Multiplanar, multiecho pulse sequences of the brain and surrounding structures, and cervical spine, to include the craniocervical junction and cervicothoracic junction, were obtained without intravenous contrast.  COMPARISON:  None.  CT of the head October 17, 2014  FINDINGS: MRI HEAD FINDINGS  Multiple small foci of confluent acute ischemia, measuring 23 x 18  mm within RIGHT corona radiata, extending into the posterior limb of the RIGHT internal capsule. Corresponding slightly decreased ADC values and, FLAIR T2 hyperintense signal. No susceptibility artifact to suggest hemorrhage. Ventricles and sulci are normal for patient's age. Minimal white matter T2 hyperintensities exclusive of the aforementioned abnormality consistent with chronic small vessel ischemic disease, less than expected for age. No midline shift, mass effect or mass lesions.  No abnormal extra-axial fluid collections. Status post bilateral ocular lens implants and, RIGHT scleral banding. Trace paranasal sinus mucosal thickening without air-fluid levels. The mastoid air cells are well aerated. No abnormal sellar expansion. No cerebellar tonsillar ectopia. No suspicious calvarial bone marrow signal.  MR CERVICAL SPINE FINDINGS  Cervical vertebral bodies intact and aligned and maintenance of  cervical lordosis. Mild to moderate C5-6 disc height loss, mild at C3-4. Decreased T2 signal within all cervical disc consistent with moderate desiccation. Moderate chronic discogenic endplate changes B0-9 and C6-7, mild at C3-4. No STIR signal abnormality to suggest acute osseous process.  Cervical spinal cord is normal morphology and signal characteristics from the cervical medullary junction to level of T3-4, the most caudal well visualized level. Craniocervical junction is intact. Mild symmetric paraspinal muscle atrophy.  Level by level evaluation (moderately motion degraded axial T2):  C2-3: No disc bulge. Moderate LEFT facet arthropathy. No canal stenosis or neural foraminal narrowing.  C3-4: Small broad-based disc bulge/central disc protrusion, uncovertebral hypertrophy. Mild to moderate bilateral facet arthropathy. Mild canal stenosis. Severe bilateral neural foraminal narrowing.  C4-5: Approximate 3 mm RIGHT central disc protrusion deforms the ventral spinal cord. Uncovertebral hypertrophy and mild facet arthropathy. Mild to moderate canal stenosis. Moderate RIGHT greater than LEFT neural foraminal narrowing.  C5-6: Small broad-based disc bulge and tiny RIGHT central disc protrusion contacts the ventral spinal cord. Uncovertebral hypertrophy. Mild canal stenosis. Severe bilateral neural foraminal narrowing.  C6-7: Small broad-based disc bulge, uncovertebral hypertrophy. No canal stenosis. Mild facet arthropathy. Mild bilateral neural foraminal narrowing.  C7-T1: No disc bulge, canal stenosis nor neural foraminal narrowing. Mild facet arthropathy.  IMPRESSION: MRI HEAD: Patchy area of acute ischemia RIGHT corona radiata extending into the posterior limb of the RIGHT internal capsule.  Otherwise unremarkable MRI of the brain for age.  MR CERVICAL SPINE: Motion degraded examination. No acute fracture or malalignment.  Degenerative change of the cervical spine. Mild to moderate canal stenosis C4-5, mild at  C3-4 and C5-6.  Neural foraminal narrowing C3-4 thru C6-7: Severe bilaterally at C3-4 and C5-6.   Electronically Signed   By: Elon Alas M.D.   On: 10/18/2014 03:17   Mr Thoracic Spine Wo Contrast  10/14/2014   CLINICAL DATA:  Weakness and dizziness since yesterday. No nausea or vomiting. No pain or fever. Shortness of breath. Lower extremity weakness.  EXAM: MRI THORACIC AND LUMBAR SPINE WITHOUT CONTRAST  TECHNIQUE: Multiplanar and multiecho pulse sequences of the thoracic and lumbar spine were obtained without intravenous contrast.  COMPARISON:  Abdominal CT 09/19/2008.  Chest CT 09/03/2013.  FINDINGS: MR THORACIC SPINE FINDINGS  Segmentation: Anatomic segmentation of the thoracolumbar spine. Counting was performed from the craniocervical junction. There is mid to lower cervical spondylosis, common in this age group.  Alignment: Exaggerated thoracic kyphosis.  Vertebrae: Negative for compression fracture. Benign vertebral body hemangioma present at T9. Low T1 signal lesion at T8 probably represents then atypical hemangioma or island of red marrow. Comparing to prior CT of the chest 09/03/2013, atypical hemangioma is favored. Other scattered small lesions are present, likely representing active marrow  or atypical hemangiomata. If the patient has a history of prostate cancer, correlation with PSA recommended. There is a history of prostatectomy presumably for malignancy but no established diagnosis of prostate cancer in the electronic medical record.  Cord: Normal.  No intramedullary lesion.  No cord edema.  Paraspinal tissues: Bilateral renal cystic lesions are present likely representing cysts. Paraspinal muscular atrophy, likely secondary to disuse. Small lipoma is present superficial to the RIGHT latissimus dorsi muscle.  Disc levels:  Diffuse disc desiccation is present. There are no thoracic disc herniations or stenosis. T7-T8 shows high signal anteriorly compatible with ossification when correlated  with the prior CT of the chest.  MR LUMBAR SPINE FINDINGS  The lumbar spine imaging could not be completed because the patient refused further imaging. No contrast was administered. The only imaging of the lumbar spine that was able the performed was inversion recovery and sagittal T1 images. These are diagnostic and will be interpreted.  Segmentation: Anatomic segmentation as described above.  Alignment: Mild levoconvex lumbar curve with the apex at L3. 2 mm of retrolisthesis of L3 on L4 is probably degenerative and discogenic. Trace anterolisthesis of L4 on L5 appears degenerative and facet mediated.  Vertebrae: Bone marrow signal shows heterogenous marrow. This is a nonspecific finding most commonly associated with obesity, anemia, cigarette smoking or chronic disease. Large hemangioma present at L3. Degenerative endplate changes are present in the superior aspect of L3 and L5-S1.  Conus medullaris: Normal at L1.  Paraspinal tissues: Trabeculation of the urinary bladder extending off the dome, suggesting chronic bladder outflow obstruction.  Disc levels:  Disc Signal: Age expected disc desiccation and degeneration.  L1-L2:  Mild disc degeneration.  No stenosis.  L2-L3: Shallow circumferential disc bulging and disc desiccation. Posterior epidural lipomatosis. Mild narrowing of the central canal. Foramina patent.  L3-L4: Congenitally narrow spinal canal with AP diameter measuring 4 mm. Short pedicles are present with mild bilateral foraminal stenosis.  L4-L5: Congenitally narrow spinal canal with superimposed degenerative disease. Shallow broad-based disc bulging and bilateral facet arthrosis. RIGHT-greater-than-LEFT foraminal stenosis is present associated with disc bulging, short pedicles and facet arthrosis.  L5-S1: Disc degeneration with shallow broad-based disc bulging. Mild LEFT foraminal stenosis secondary to disc bulging. The RIGHT foramen appears patent. Central canal patent.  IMPRESSION: MR THORACIC SPINE  IMPRESSION  No thoracic spinal stenosis or acute abnormality. Mildly exaggerated thoracic kyphosis.  MR LUMBAR SPINE IMPRESSION  1. Heterogeneous marrow with through the thoracolumbar spine. In this patient with presumed history of prostate cancer and prostatectomy, correlation with PSA is recommended. 2. The congenitally narrow spinal canal with superimposed degenerative disease. Posterior epidural lipomatosis. 3. Patient refused further imaging and only sagittal T1 and inversion recovery images were obtained. 4. Moderate lumbar spondylosis for age.   Electronically Signed   By: Dereck Ligas M.D.   On: 10/14/2014 12:30   Mr Lumbar Spine Wo Contrast  10/14/2014   CLINICAL DATA:  Weakness and dizziness since yesterday. No nausea or vomiting. No pain or fever. Shortness of breath. Lower extremity weakness.  EXAM: MRI THORACIC AND LUMBAR SPINE WITHOUT CONTRAST  TECHNIQUE: Multiplanar and multiecho pulse sequences of the thoracic and lumbar spine were obtained without intravenous contrast.  COMPARISON:  Abdominal CT 09/19/2008.  Chest CT 09/03/2013.  FINDINGS: MR THORACIC SPINE FINDINGS  Segmentation: Anatomic segmentation of the thoracolumbar spine. Counting was performed from the craniocervical junction. There is mid to lower cervical spondylosis, common in this age group.  Alignment: Exaggerated thoracic kyphosis.  Vertebrae: Negative for  compression fracture. Benign vertebral body hemangioma present at T9. Low T1 signal lesion at T8 probably represents then atypical hemangioma or island of red marrow. Comparing to prior CT of the chest 09/03/2013, atypical hemangioma is favored. Other scattered small lesions are present, likely representing active marrow or atypical hemangiomata. If the patient has a history of prostate cancer, correlation with PSA recommended. There is a history of prostatectomy presumably for malignancy but no established diagnosis of prostate cancer in the electronic medical record.  Cord:  Normal.  No intramedullary lesion.  No cord edema.  Paraspinal tissues: Bilateral renal cystic lesions are present likely representing cysts. Paraspinal muscular atrophy, likely secondary to disuse. Small lipoma is present superficial to the RIGHT latissimus dorsi muscle.  Disc levels:  Diffuse disc desiccation is present. There are no thoracic disc herniations or stenosis. T7-T8 shows high signal anteriorly compatible with ossification when correlated with the prior CT of the chest.  MR LUMBAR SPINE FINDINGS  The lumbar spine imaging could not be completed because the patient refused further imaging. No contrast was administered. The only imaging of the lumbar spine that was able the performed was inversion recovery and sagittal T1 images. These are diagnostic and will be interpreted.  Segmentation: Anatomic segmentation as described above.  Alignment: Mild levoconvex lumbar curve with the apex at L3. 2 mm of retrolisthesis of L3 on L4 is probably degenerative and discogenic. Trace anterolisthesis of L4 on L5 appears degenerative and facet mediated.  Vertebrae: Bone marrow signal shows heterogenous marrow. This is a nonspecific finding most commonly associated with obesity, anemia, cigarette smoking or chronic disease. Large hemangioma present at L3. Degenerative endplate changes are present in the superior aspect of L3 and L5-S1.  Conus medullaris: Normal at L1.  Paraspinal tissues: Trabeculation of the urinary bladder extending off the dome, suggesting chronic bladder outflow obstruction.  Disc levels:  Disc Signal: Age expected disc desiccation and degeneration.  L1-L2:  Mild disc degeneration.  No stenosis.  L2-L3: Shallow circumferential disc bulging and disc desiccation. Posterior epidural lipomatosis. Mild narrowing of the central canal. Foramina patent.  L3-L4: Congenitally narrow spinal canal with AP diameter measuring 4 mm. Short pedicles are present with mild bilateral foraminal stenosis.  L4-L5:  Congenitally narrow spinal canal with superimposed degenerative disease. Shallow broad-based disc bulging and bilateral facet arthrosis. RIGHT-greater-than-LEFT foraminal stenosis is present associated with disc bulging, short pedicles and facet arthrosis.  L5-S1: Disc degeneration with shallow broad-based disc bulging. Mild LEFT foraminal stenosis secondary to disc bulging. The RIGHT foramen appears patent. Central canal patent.  IMPRESSION: MR THORACIC SPINE IMPRESSION  No thoracic spinal stenosis or acute abnormality. Mildly exaggerated thoracic kyphosis.  MR LUMBAR SPINE IMPRESSION  1. Heterogeneous marrow with through the thoracolumbar spine. In this patient with presumed history of prostate cancer and prostatectomy, correlation with PSA is recommended. 2. The congenitally narrow spinal canal with superimposed degenerative disease. Posterior epidural lipomatosis. 3. Patient refused further imaging and only sagittal T1 and inversion recovery images were obtained. 4. Moderate lumbar spondylosis for age.   Electronically Signed   By: Dereck Ligas M.D.   On: 10/14/2014 12:30    Microbiology: Recent Results (from the past 240 hour(s))  Urine culture     Status: None   Collection Time: 10/14/14  9:03 AM  Result Value Ref Range Status   Specimen Description URINE, CLEAN CATCH  Final   Special Requests NONE  Final   Culture   Final    7,000 COLONIES/mL INSIGNIFICANT GROWTH Performed at  [facility]    Report Status 10/15/2014 FINAL  Final     Labs: Basic Metabolic Panel:  Recent Labs Lab 10/14/14 0745 10/17/14 1753 10/17/14 1811 10/18/14 0610 10/19/14 0953  NA 137 138 141 138 138  K 3.0* 3.9 3.9 3.4* 3.8  CL 104 102 102 104 104  CO2 23 27  --  25 27  GLUCOSE 154* 112* 112* 125* 118*  BUN 10 11 14 11 13   CREATININE 1.09 1.17 1.00 1.19 1.21  CALCIUM 9.5 10.1  --  9.2 9.7   Liver Function Tests:  Recent Labs Lab 10/14/14 0745 10/17/14 1753 10/19/14 0953  AST 18 22 16    ALT 15* 17 16*  ALKPHOS 62 67 63  BILITOT 0.8 0.5 0.7  PROT 7.8 8.0 6.9  ALBUMIN 3.9 4.0 3.4*   No results for input(s): LIPASE, AMYLASE in the last 168 hours. No results for input(s): AMMONIA in the last 168 hours. CBC:  Recent Labs Lab 10/14/14 0745 10/17/14 1753 10/17/14 1811 10/18/14 0610  WBC 5.6 5.8  --  5.3  NEUTROABS 3.7 3.4  --   --   HGB 13.9 14.8 17.0 14.2  HCT 41.1 43.4 50.0 42.5  MCV 85.1 84.9  --  85.0  PLT 201 242  --  202   Cardiac Enzymes:  Recent Labs Lab 10/14/14 0745  TROPONINI <0.03   BNP: BNP (last 3 results) No results for input(s): BNP in the last 8760 hours.  ProBNP (last 3 results) No results for input(s): PROBNP in the last 8760 hours.  CBG:  Recent Labs Lab 10/19/14 0733 10/19/14 1133 10/19/14 1633 10/19/14 2107 10/20/14 0736  GLUCAP 97 100* 92 108* 113*       Signed:  Saleem Coccia N  [facility] 10/20/2014, 10:55 AM

## 2014-10-20 NOTE — Progress Notes (Signed)
VASCULAR LAB PRELIMINARY  PRELIMINARY  PRELIMINARY  PRELIMINARY  Carotid duplex completed.    Preliminary report:  Bilateral:  1-39% ICA stenosis.  Vertebral artery flow is antegrade.     Genni Buske, RVS 10/20/2014, 12:17 PM

## 2014-10-20 NOTE — Clinical Social Work Placement (Signed)
   CLINICAL SOCIAL WORK PLACEMENT  NOTE  Date:  10/20/2014  Patient Details  Name: RIVER AMBROSIO MRN: [id number] Date of Birth: Oct 28, 1929  Clinical Social Work is seeking post-discharge placement for this patient at the [place] level of care (*CSW will initial, date and re-position this form in  chart as items are completed):  Yes   Patient/family provided with [place] Work Department's list of facilities offering this level of care within the geographic area requested by the patient (or if unable, by the patient's family).  Yes   Patient/family informed of their freedom to choose among providers that offer the needed level of care, that participate in Medicare, Medicaid or managed care program needed by the patient, have an available bed and are willing to accept the patient.  Yes   Patient/family informed of 's ownership interest in [facility] and [facility], as well as of the fact that they are under no obligation to receive care at these facilities.  PASRR submitted to EDS on 10/20/14     PASRR number received on 10/20/14     Existing PASRR number confirmed on       FL2 transmitted to all facilities in geographic area requested by pt/family on 10/20/14     FL2 transmitted to all facilities within larger geographic area on       Patient informed that his/her managed care company has contracts with or will negotiate with certain facilities, including the following:            Patient/family informed of bed offers received.  Patient chooses bed at       Physician recommends and patient chooses bed at      Patient to be transferred to   on  .  Patient to be transferred to facility by       Patient family notified on   of transfer.  Name of family member notified:        PHYSICIAN Please sign FL2, Please prepare priority discharge summary, including medications, Please prepare prescriptions     Additional Comment:     _______________________________________________ Berton Mount, [place]

## 2014-10-20 NOTE — Progress Notes (Signed)
STROKE TEAM PROGRESS NOTE   SUBJECTIVE (INTERVAL HISTORY) No family is at the bedside.  Overall he feels his condition is gradually improving. Eating lunch during rounds today.  OBJECTIVE Temp:  [97.6 F (36.4 C)-98.2 F (36.8 C)] 98.2 F (36.8 C) (08/18 0440) Pulse Rate:  [54-63] 55 (08/18 0015) Cardiac Rhythm:  [-] Heart block;Normal sinus rhythm;Sinus bradycardia (08/18 0800) Resp:  [19-22] 22 (08/18 0440) BP: (120-162)/(64-117) 137/79 mmHg (08/18 0440) SpO2:  [91 %-98 %] 91 % (08/18 0440)   Recent Labs Lab 10/19/14 0733 10/19/14 1133 10/19/14 1633 10/19/14 2107 10/20/14 0736  GLUCAP 97 100* 92 108* 113*    Recent Labs Lab 10/14/14 0745 10/17/14 1753 10/17/14 1811 10/18/14 0610 10/19/14 0953  NA 137 138 141 138 138  K 3.0* 3.9 3.9 3.4* 3.8  CL 104 102 102 104 104  CO2 23 27  --  25 27  GLUCOSE 154* 112* 112* 125* 118*  BUN 10 11 14 11 13   CREATININE 1.09 1.17 1.00 1.19 1.21  CALCIUM 9.5 10.1  --  9.2 9.7    Recent Labs Lab 10/14/14 0745 10/17/14 1753 10/19/14 0953  AST 18 22 16   ALT 15* 17 16*  ALKPHOS 62 67 63  BILITOT 0.8 0.5 0.7  PROT 7.8 8.0 6.9  ALBUMIN 3.9 4.0 3.4*    Recent Labs Lab 10/14/14 0745 10/17/14 1753 10/17/14 1811 10/18/14 0610  WBC 5.6 5.8  --  5.3  NEUTROABS 3.7 3.4  --   --   HGB 13.9 14.8 17.0 14.2  HCT 41.1 43.4 50.0 42.5  MCV 85.1 84.9  --  85.0  PLT 201 242  --  202    Recent Labs Lab 10/14/14 0745  TROPONINI <0.03    Recent Labs  10/17/14 1753  LABPROT 14.0  INR 1.06    Recent Labs  10/19/14 1616  COLORURINE YELLOW  LABSPEC 1.011  PHURINE 5.5  GLUCOSEU NEGATIVE  HGBUR NEGATIVE  BILIRUBINUR NEGATIVE  KETONESUR NEGATIVE  PROTEINUR NEGATIVE  UROBILINOGEN 0.2  NITRITE NEGATIVE  LEUKOCYTESUR NEGATIVE       Component Value Date/Time   CHOL 129 10/19/2014 0300   TRIG 133 10/19/2014 0300   HDL 37* 10/19/2014 0300   CHOLHDL 3.5 10/19/2014 0300   VLDL 27 10/19/2014 0300   LDLCALC 65 10/19/2014  0300   Lab Results  Component Value Date   HGBA1C 6.6* 10/18/2014   No results found for: LABOPIA, COCAINSCRNUR, LABBENZ, AMPHETMU, THCU, LABBARB  No results for input(s): ETH in the last 168 hours.  I have personally reviewed the radiological images below and agree with the radiology interpretations.  Dg Chest 2 View 10/14/2014   IMPRESSION: No acute cardiopulmonary disease.      Ct Head Wo Contrast 10/17/2014   IMPRESSION: 1. No acute intracranial pathology seen on CT. 2. Small chronic lacunar infarct at the posterior right corona radiata, better seen than in 2011.      Mr Brain Wo Contrast 10/18/2014    Patchy area of acute ischemia RIGHT corona radiata extending into the posterior limb of the RIGHT internal capsule.  Otherwise unremarkable MRI of the brain for age.    Mr Cervical Spine Wo Contrast Motion degraded examination. No acute fracture or malalignment.  Degenerative change of the cervical spine. Mild to moderate canal stenosis C4-5, mild at C3-4 and C5-6.  Neural foraminal narrowing C3-4 thru C6-7: Severe bilaterally at C3-4 and C5-6.    Mr Thoracic Spine Wo Contrast 10/14/2014   No thoracic spinal stenosis  or acute abnormality. Mildly exaggerated thoracic kyphosis.   Mr Lumbar Spine Wo Contrast 1. Heterogeneous marrow with through the thoracolumbar spine. In this patient with presumed history of prostate cancer and prostatectomy, correlation with PSA is recommended. 2. The congenitally narrow spinal canal with superimposed degenerative disease. Posterior epidural lipomatosis. 3. Patient refused further imaging and only sagittal T1 and inversion recovery images were obtained. 4. Moderate lumbar spondylosis for age.  Carotid Doppler  - Bilateral: 1-39% ICA stenosis. Vertebral artery flow is antegrade.  2D Echocardiogram  - Normal LV size with mild LV hypertrophy. EF 55%. Mildly dilated RV with mildly decreased systolic function. Aortic sclerosis without significant  stenosis.  PHYSICAL EXAM  Temp:  [97.6 F (36.4 C)-98.2 F (36.8 C)] 98.2 F (36.8 C) (08/18 0440) Pulse Rate:  [54-63] 55 (08/18 0015) Resp:  [19-22] 22 (08/18 0440) BP: (120-162)/(64-117) 137/79 mmHg (08/18 0440) SpO2:  [91 %-98 %] 91 % (08/18 0440)  General - Well nourished, well developed, in no apparent distress.  Ophthalmologic - fundi not visualized due to eye movement.  Cardiovascular - Regular rate and rhythm.  Mental Status -  Level of arousal and orientation to time, place, and person were intact. Language including expression, naming, repetition, comprehension was assessed and found intact, but mild dysarthria. Fund of Knowledge was assessed and was intact.  Cranial Nerves II - XII - II - left eye light perception, right eye counting fingers, s/p b/l glaucoma surgery. III, IV, VI - bilateral surgical pupils, bilateral lateral gaze not complete. V - Facial sensation intact bilaterally. VII - mild left nasolabial fold flattening. VIII - Hearing & vestibular intact bilaterally. X - Palate elevates symmetrically. XI - Chin turning & shoulder shrug intact bilaterally. XII - Tongue protrusion intact.  Motor Strength - The patient's strength was normal in all extremities except LLE 4+/5 proximal and distal and pronator drift was absent.  Bulk was normal and fasciculations were absent.   Motor Tone - Muscle tone was assessed at the neck and appendages and was normal.  Reflexes - The patient's reflexes were symmetrical in all extremities and he had no pathological reflexes.  Sensory - Light touch, temperature/pinprick were assessed and were symmetrical.    Coordination - The patient had normal movements in the handswith no ataxia or dysmetria.  Tremor was absent.  Gait and Station - not tested due to safety concerns.   ASSESSMENT/PLAN Trevor Andrews is a 81 y.o. male with history of cognitive impairment, HTN, DM, HLD, CAD status post stent, prostate cancer status  post surgery admitted for left lower extremity weakness and off balance for 3 days. Symptoms gradually improving.    Stroke:  right corona radiata small infarct likely secondary to small vessel disease source  MRI  Right CR small infarct  Carotid Doppler unremarkable  2D Echo unremarkable  LDL 65  HgbA1c 6.6  Lovenox for VTE prophylaxis  Diet Heart Room service appropriate?: Yes; Fluid consistency:: Thin   aspirin 81 mg orally every day and clopidogrel 75 mg orally every day prior to admission, now on aspirin 81 mg orally every day and clopidogrel 75 mg orally every day. Continued dural antiplatelet for stroke and cardiac prevention.  Patient counseled to be compliant with his antithrombotic medications  Ongoing aggressive stroke risk factor management  Therapy recommendations:  Pending  Disposition:  Pending  Diabetes  HgbA1c 6.6 goal < 7.0  Controlled  CBG monitoring  SSI  Management as per primary team  Hypertension  Home meds:  Amlodipine Permissive hypertension (OK if <220/120) for 24-48 hours post stroke and then gradually normalized within 5-7 days. Currently on amlodipine  Stable  Patient counseled to be compliant with his blood pressure medications  Hyperlipidemia  Home meds:  Lipitor 20   Currently on Lipitor 20  LDL 65, goal < 70  Continue statin at discharge  Other Stroke Risk Factors  Advanced age  Coronary artery disease status post stent  Other Active Problems  Cognitive impairment  Hypokalemia  Elevated TSH but normal FT4  Other Pertinent History  Bilateral glaucoma surgery  Left eye legally blind  Hospital day # 2   Neurology will sign off. Please call with questions. Pt will follow up with Dr. Erlinda Hong at [facility] in about 2 months. Thanks for the consult.  Rosalin Hawking, MD PhD Stroke Neurology 10/20/2014 9:32 AM    To contact Stroke Continuity provider, please refer to [email or web address] After hours, contact General Neurology

## 2014-10-25 ENCOUNTER — Non-Acute Institutional Stay (SKILLED_NURSING_FACILITY): Payer: Medicare Other | Admitting: Internal Medicine

## 2014-10-25 DIAGNOSIS — I10 Essential (primary) hypertension: Secondary | ICD-10-CM | POA: Diagnosis not present

## 2014-10-25 DIAGNOSIS — E11319 Type 2 diabetes mellitus with unspecified diabetic retinopathy without macular edema: Secondary | ICD-10-CM | POA: Diagnosis not present

## 2014-10-25 DIAGNOSIS — I2510 Atherosclerotic heart disease of native coronary artery without angina pectoris: Secondary | ICD-10-CM | POA: Diagnosis not present

## 2014-10-25 DIAGNOSIS — I6990 Unspecified sequelae of unspecified cerebrovascular disease: Secondary | ICD-10-CM | POA: Diagnosis not present

## 2014-10-31 ENCOUNTER — Other Ambulatory Visit (HOSPITAL_COMMUNITY): Payer: Self-pay | Admitting: Urology

## 2014-10-31 DIAGNOSIS — C61 Malignant neoplasm of prostate: Secondary | ICD-10-CM

## 2014-10-31 NOTE — Progress Notes (Signed)
Patient ID: Trevor Andrews, male   DOB: [birth year], 81 y.o.   MRN: [id number]                HISTORY & PHYSICAL  DATE:  10/25/2014       FACILITY: [facility]                   LEVEL OF CARE:   SNF   CHIEF COMPLAINT:  Admission to SNF, post stay at [facility], 10/17/2014 through 10/20/2014.    HISTORY OF PRESENT ILLNESS:  This is a patient who presented to the hospital with increasing lower extremity weakness and falls.  Previously seen in the ER three days before admission due to bilateral lower extremity weakness.  He had an MRI of the T-spine which was negative for cord lesions.    He represented to hospital saying that his left leg felt weak.  Also noted facial drooping on the left.  MRI of the cervical spine and brain were done.  The cervical spine showed mild to moderate C5-C6 disc height, moderate disc endplate changes at X7-D5, C6-C7, and C3-C4.   The cord was normal to T3-T4.   His MRA of the brain showed patchy area of acute ischemia in the right corona radiata, extending into the posterior limb of the right internal capsule.    The patient has a history of a coronary artery stent.  Therefore, the patient was discharged on aspirin 81 and Plavix 75.  I am not completely certain if any of these represented change from prior to the event.    LABORATORY DATA/RADIOLOGY:  His echocardiogram was recently normal.    LDL was 65.    Hemoglobin A1c was 8.6.    PAST MEDICAL HISTORY/PROBLEM LIST:                 Right ischemic stroke, as described.    Gait ataxia.    Hypertension.    Hypercholesterolemia.    Dementia.    History of a coronary artery stent.    Gait abnormality.    Stroke with cerebral ischemia.    Cerebral infarction due to thrombosis of the right middle cerebral artery.    CURRENT MEDICATIONS:  Medication list is reviewed.                Tylenol p.r.n.    Allopurinol 100 q.d.      Norvasc 5 q.d.      Enteric-coated aspirin 81 q.d.      Lipitor 20  q.d.      Os-Cal plus D 1 tablet twice daily.      Vitamin D 1000 U daily.    Plavix 75 q.d.      Cosopt ophthalmic b.i.d.      Prednisolone ophthalmic 1 drop into right eye daily.     Flomax 0.4 q.d.      SOCIAL HISTORY:                   HOUSING:  The patient tells me he lives in his own home.  His wife is also a patient in this facility.   FUNCTIONAL STATUS:  He has outside help from his daughter.  He uses a cane.  States he is independent with ADLs.    REVIEW OF SYSTEMS:            HEENT:  The patient denies headache.  States he is legally blind.  Has a little vision, I believe, in his left eye.  CHEST/RESPIRATORY:  No cough.  No sputum.       CARDIAC:  No chest pain.    GI:  No abdominal pain.  No change in bowel habits.  No incontinence.    GU:  No urinary incontinence.   NEUROLOGICAL:  States his left side is still weak, but he thinks he is recovering.   PSYCHIATRIC:  Mental status:  No complaints of depression.    PHYSICAL EXAMINATION:   VITAL SIGNS:     TEMPERATURE:   PULSE:   RESPIRATIONS:   BLOOD PRESSURE:   02 SATURATIONS:   HEIGHT:   WEIGHT:   GENERAL APPEARANCE:  The patient is not in any distress.   Bright and alert.   HEENT:   EYES:  He has ptosis on the left, which he says is secondary to corneal implant.   MOUTH/THROAT:    Oral exam is without lesion.   LYMPHATICS:  None palpable in the cervical, clavicular, or axillary areas.   CHEST/RESPIRATORY:  Clear air entry bilaterally.     CARDIOVASCULAR:   CARDIAC:  Heart sounds are reduced.  There are no murmurs.  No gallops.    No carotid bruits.   GASTROINTESTINAL:   LIVER/SPLEEN/KIDNEY:   No liver, no spleen.  No tenderness.    GENITOURINARY:   BLADDER:  Not enlarged.   VASCULAR:   ARTERIAL:  Extremities:  Peripheral pulses are palpable.    NEUROLOGICAL:   DEEP TENDON REFLEXES:  He is diffusely hyporeflexic.  Toes are downgoing.   PSYCHIATRIC:   MENTAL STATUS:    I see no evidence of depression  here.  He is bright, alert, and conversational.    ASSESSMENT/PLAN:               Right CVA, as noted, involving the basal ganglia, the right corona radiata, and extending into the posterior limb of the right internal capsule.  He appears to be making a good recovery from this.    Coronary artery disease.  Status post stent placement.  I am unable to get a history that would suggest that this is active.    BPH.  On Flomax.   The patient states no voiding difficulties.    Type 2 diabetes.  Not currently on any medications.  His hemoglobin A1c was 6.6.    Hypertension.  We will monitor while he is here.    Hyperlipidemia.  Appears to be controlled.    Type 2 diabetes with diabetic retinopathy.  He tells me he is seeing a retinal specialist now.  I am assuming this is the reason for his blindness.    The patient appears to be recovering.  We will have to see how he does in physical and occupational therapy and what he has in terms of social support at home.  He tells me that he does have a flight of stairs that he needs to manage.     CPT CODE: 34917

## 2014-11-03 ENCOUNTER — Encounter (INDEPENDENT_AMBULATORY_CARE_PROVIDER_SITE_OTHER): Payer: Medicare Other | Admitting: Ophthalmology

## 2014-11-10 ENCOUNTER — Encounter (HOSPITAL_COMMUNITY): Payer: Medicare Other

## 2014-11-17 ENCOUNTER — Encounter (HOSPITAL_COMMUNITY): Payer: Self-pay | Admitting: Emergency Medicine

## 2014-11-17 ENCOUNTER — Emergency Department (INDEPENDENT_AMBULATORY_CARE_PROVIDER_SITE_OTHER)
Admission: EM | Admit: 2014-11-17 | Discharge: 2014-11-17 | Disposition: A | Discharge status: Home / Self Care | Payer: Medicare Other | Source: Home / Self Care | Attending: Family Medicine | Admitting: Family Medicine

## 2014-11-17 ENCOUNTER — Encounter (HOSPITAL_COMMUNITY): Payer: Self-pay

## 2014-11-17 ENCOUNTER — Emergency Department (HOSPITAL_COMMUNITY)
Admission: EM | Admit: 2014-11-17 | Discharge: 2014-11-18 | Disposition: A | Discharge status: Home / Self Care | Payer: Non-veteran care | Attending: Emergency Medicine | Admitting: Emergency Medicine

## 2014-11-17 DIAGNOSIS — I4891 Unspecified atrial fibrillation: Secondary | ICD-10-CM | POA: Insufficient documentation

## 2014-11-17 DIAGNOSIS — R55 Syncope and collapse: Secondary | ICD-10-CM

## 2014-11-17 DIAGNOSIS — Z7902 Long term (current) use of antithrombotics/antiplatelets: Secondary | ICD-10-CM | POA: Diagnosis not present

## 2014-11-17 DIAGNOSIS — R42 Dizziness and giddiness: Secondary | ICD-10-CM | POA: Insufficient documentation

## 2014-11-17 DIAGNOSIS — M1990 Unspecified osteoarthritis, unspecified site: Secondary | ICD-10-CM | POA: Insufficient documentation

## 2014-11-17 DIAGNOSIS — R51 Headache: Secondary | ICD-10-CM | POA: Diagnosis present

## 2014-11-17 DIAGNOSIS — I10 Essential (primary) hypertension: Secondary | ICD-10-CM | POA: Insufficient documentation

## 2014-11-17 DIAGNOSIS — Z8546 Personal history of malignant neoplasm of prostate: Secondary | ICD-10-CM | POA: Diagnosis not present

## 2014-11-17 DIAGNOSIS — E119 Type 2 diabetes mellitus without complications: Secondary | ICD-10-CM | POA: Insufficient documentation

## 2014-11-17 DIAGNOSIS — E780 Pure hypercholesterolemia: Secondary | ICD-10-CM | POA: Insufficient documentation

## 2014-11-17 DIAGNOSIS — R0600 Dyspnea, unspecified: Secondary | ICD-10-CM

## 2014-11-17 DIAGNOSIS — Z8673 Personal history of transient ischemic attack (TIA), and cerebral infarction without residual deficits: Secondary | ICD-10-CM | POA: Diagnosis not present

## 2014-11-17 DIAGNOSIS — Z79899 Other long term (current) drug therapy: Secondary | ICD-10-CM | POA: Insufficient documentation

## 2014-11-17 DIAGNOSIS — Z7982 Long term (current) use of aspirin: Secondary | ICD-10-CM | POA: Insufficient documentation

## 2014-11-17 HISTORY — DX: Cerebral infarction, unspecified: I63.9

## 2014-11-17 HISTORY — DX: Dizziness and giddiness: R42

## 2014-11-17 LAB — POCT I-STAT, CHEM 8
BUN: 17 mg/dL (ref 6–20)
Calcium, Ion: 1.29 mmol/L (ref 1.13–1.30)
Chloride: 104 mmol/L (ref 101–111)
Creatinine, Ser: 1.2 mg/dL (ref 0.61–1.24)
Glucose, Bld: 94 mg/dL (ref 65–99)
HEMATOCRIT: 45 % (ref 39.0–52.0)
HEMOGLOBIN: 15.3 g/dL (ref 13.0–17.0)
POTASSIUM: 4.1 mmol/L (ref 3.5–5.1)
SODIUM: 141 mmol/L (ref 135–145)
TCO2: 25 mmol/L (ref 0–100)

## 2014-11-17 NOTE — ED Provider Notes (Addendum)
History  This chart was scribed for Trevor Balls, MD by [name], ED Scribe. This patient was seen in room WA16/WA16 and the patient's care was started at 12:20 AM.  Chief Complaint  Patient presents with  . Headache   The history is provided by the patient and medical records. No language interpreter was used.    HPI Comments:  Trevor Andrews is a 81 y.o. male with PMHx of prostate cancer and CVA brought in by EMS, who presents to the Emergency Department complaining of HA that began earlier today. He describes the pain as tightening around the head and reports associated dizziness stating he felt as if he would pass out earlier. When asked why he is here is states he does not know and that his daughter called paramedics. He reports having a stroke approximately one month. He states he was discharged from a rehab facility one week ago and was there for two weeks. He lives at home with his daughter. He denies modifying factors of the symptoms. He denies fever or chills. Pt states he is legally blind due to glaucoma.  Past Medical History  Diagnosis Date  . Hypertension   . Hypercholesteremia   . Arthritis   . Dementia   . Diabetes mellitus without complication   . Cancer 1998    prostate  . Stroke   . Atrial fibrillation   . Vertigo    Past Surgical History  Procedure Laterality Date  . Prostatectomy    . Cataract extraction  2011 and 2012    2011lt eye and rt eye 2012  . Glauma implant  2012    rt.eye  . Coronary angioplasty with stent placement     No family history on file. Social History  Substance Use Topics  . Smoking status: Never Smoker   . Smokeless tobacco: Never Used  . Alcohol Use: 1.2 oz/week    2 Shots of liquor per week     Comment: rare    Review of Systems A complete 10 system review of systems was obtained and all systems are negative except as noted in the HPI and PMH.   Allergies  Review of patient's allergies indicates no known  allergies.  Home Medications   Prior to Admission medications   Medication Sig Start Date End Date Taking? Authorizing Provider  acetaminophen (TYLENOL) 325 MG tablet Take 2 tablets (650 mg total) by mouth every 6 (six) hours as needed for mild pain (or Fever >/= 101). 10/20/14  Yes Kinnie Feil, MD  allopurinol (ZYLOPRIM) 100 MG tablet Take 200 mg by mouth daily.    Yes Historical Provider, MD  amLODipine (NORVASC) 10 MG tablet Take 5 mg by mouth daily.    Yes Historical Provider, MD  aspirin EC 81 MG tablet Take 81 mg by mouth daily.   Yes Historical Provider, MD  atorvastatin (LIPITOR) 40 MG tablet Take 20 mg by mouth at bedtime.   Yes Historical Provider, MD  Calcium Carbonate-Vitamin D (CALCIUM 600+D) 600-400 MG-UNIT per tablet Take 1 tablet by mouth 2 (two) times daily.   Yes Historical Provider, MD  cholecalciferol (VITAMIN D) 1000 UNITS tablet Take 1,000 Units by mouth daily.   Yes Historical Provider, MD  clopidogrel (PLAVIX) 75 MG tablet Take 75 mg by mouth daily.   Yes Historical Provider, MD  dorzolamide-timolol (COSOPT) 22.3-6.8 MG/ML ophthalmic solution Place 2 drops into both eyes Twice daily. 04/21/11  Yes Historical Provider, MD  polyvinyl alcohol (LIQUIFILM TEARS) 1.4 % ophthalmic  solution Place 1 drop into both eyes 3 (three) times daily as needed for dry eyes.   Yes Historical Provider, MD  prednisoLONE acetate (PRED FORTE) 1 % ophthalmic suspension Place 1 drop into the right eye daily.  04/12/11  Yes Historical Provider, MD  tamsulosin (FLOMAX) 0.4 MG CAPS capsule Take 0.4 mg by mouth at bedtime.   Yes Historical Provider, MD   Triage Vitals: BP 144/65 mmHg  Pulse 51  Temp(Src) 98.3 F (36.8 C) (Oral)  Resp 18  SpO2 97% Physical Exam  Constitutional: He is oriented to person, place, and time. Vital signs are normal. He appears well-developed and well-nourished.  Non-toxic appearance. He does not appear ill. No distress.  HENT:  Head: Normocephalic and atraumatic.   Nose: Nose normal.  Mouth/Throat: Oropharynx is clear and moist. No oropharyngeal exudate.  Eyes: No scleral icterus.  Neck: Normal range of motion. Neck supple. No tracheal deviation, no edema, no erythema and normal range of motion present. No thyroid mass and no thyromegaly present.  Cardiovascular: Regular rhythm, S1 normal, S2 normal, normal heart sounds, intact distal pulses and normal pulses.  Bradycardia present.  Exam reveals no gallop and no friction rub.   No murmur heard. Pulses:      Radial pulses are 2+ on the right side, and 2+ on the left side.       Dorsalis pedis pulses are 2+ on the right side, and 2+ on the left side.  Pulmonary/Chest: Effort normal and breath sounds normal. No respiratory distress. He has no wheezes. He has no rhonchi. He has no rales.  Abdominal: Soft. Normal appearance and bowel sounds are normal. He exhibits no distension, no ascites and no mass. There is no hepatosplenomegaly. There is no tenderness. There is no rebound, no guarding and no CVA tenderness.  Musculoskeletal: Normal range of motion. He exhibits no edema or tenderness.  Lymphadenopathy:    He has no cervical adenopathy.  Neurological: He is alert and oriented to person, place, and time. He has normal strength. No cranial nerve deficit or sensory deficit.  4/5 strength of left upper and left lower extremities. 5/5 strength of right upper and lower extremities. Could not perform cerebellar testing due to blindness.  Skin: Skin is warm, dry and intact. No petechiae and no rash noted. He is not diaphoretic. No erythema. No pallor.  Psychiatric: He has a normal mood and affect. His behavior is normal. Judgment normal.  Nursing note and vitals reviewed.   ED Course  Procedures (including critical care time) DIAGNOSTIC STUDIES: Oxygen Saturation is 97% on RA, normal by my interpretation.   COORDINATION OF CARE: 12:31 AM- Will transfer to [facility] for MRI. Pt verbalizes understanding and agrees  to plan.  Medications - No data to display  Labs Review Labs Reviewed  CBC WITH DIFFERENTIAL/PLATELET - Abnormal; Notable for the following:    HCT 38.8 (*)    All other components within normal limits  BASIC METABOLIC PANEL - Abnormal; Notable for the following:    Glucose, Bld 107 (*)    GFR calc non Af Amer 55 (*)    All other components within normal limits  URINALYSIS, ROUTINE W REFLEX MICROSCOPIC (NOT AT [facility]) - Abnormal; Notable for the following:    Leukocytes, UA SMALL (*)    All other components within normal limits  URINE MICROSCOPIC-ADD ON  Randolm Idol, ED    Imaging Review Dg Chest 2 View  11/18/2014   CLINICAL DATA:  Dizziness.  Headache for several  hours.  EXAM: CHEST  2 VIEW  COMPARISON:  10/14/2014  FINDINGS: The cardiomediastinal contours are unchanged. Pulmonary vasculature is normal. No consolidation, pleural effusion, or pneumothorax. No acute osseous abnormalities are seen. Degenerative change in the spine.  IMPRESSION: No acute pulmonary process.   Electronically Signed   By: Jeb Levering M.D.   On: 11/18/2014 01:20   Mr Jodene Nam Head Wo Contrast  11/18/2014   CLINICAL DATA:  Headache beginning earlier today, head tightness. History of stroke 1 month ago. Legally blind. History of dementia, hypertension, atrial fibrillation, stroke, cancer.  EXAM: MRI HEAD WITHOUT CONTRAST  MRA HEAD WITHOUT CONTRAST  TECHNIQUE: Multiplanar, multiecho pulse sequences of the brain and surrounding structures were obtained without intravenous contrast. Angiographic images of the head were obtained using MRA technique without contrast.  COMPARISON:  MRI of the brain October 18, 2014  FINDINGS: MRI HEAD FINDINGS  Residual patchy reduced diffusion RIGHT corona radiata, normalized ADC values. No new areas of acute ischemia. No susceptibility artifact to suggest hemorrhage. The ventricles and sulci are normal for patient's age. No midline shift, mass effect or mass lesions. Minimal white  matter changes compatible chronic small vessel ischemic disease, less than expected for age.  No abnormal extra-axial fluid collections. Status post bilateral ocular lens implants and probable RIGHT scleral banding. Trace paranasal sinus mucosal thickening without air-fluid levels. The mastoid air cells are well aerated. No abnormal sellar expansion. No cerebellar tonsillar ectopia. No suspicious calvarial bone marrow signal. Patient is edentulous.  MRA HEAD FINDINGS  Anterior circulation: Normal flow related enhancement of the included cervical, petrous, cavernous and supra clinoid internal carotid arteries. Patent anterior communicating artery. Normal flow related enhancement of the anterior and middle cerebral arteries, including more distal segments.  No large vessel occlusion, high-grade stenosis, aneurysm. Mild to moderate luminal irregularity of the mid to distal middle cerebral arteries.  Posterior circulation: LEFT vertebral artery is dominant. Slow flow RIGHT vertebral artery. 12 mm segment of moderate stenosis proximal LEFT V4 segment. Basilar artery is patent, with normal flow related enhancement of the main branch vessels. Small bilateral posterior communicating arteries are present. Focal high-grade stenosis LEFT P1 origin, distal slow flow.  No large vessel occlusion, abnormal luminal irregularity, aneurysm.  IMPRESSION: MRI HEAD: No acute intracranial process.  Subacute RIGHT corona radiata/internal capsule infarct. Otherwise normal MRI of the brain for age.  MRA HEAD: No acute large vessel occlusion.  Focal high-grade stenosis LEFT P1 origin. Moderate stenosis LEFT V4 segment.  Mild to moderate luminal irregularity of the middle cerebral arteries compatible with atherosclerosis.   Electronically Signed   By: Elon Alas M.D.   On: 11/18/2014 04:01   Mr Brain Wo Contrast  11/18/2014   CLINICAL DATA:  Headache beginning earlier today, head tightness. History of stroke 1 month ago. Legally  blind. History of dementia, hypertension, atrial fibrillation, stroke, cancer.  EXAM: MRI HEAD WITHOUT CONTRAST  MRA HEAD WITHOUT CONTRAST  TECHNIQUE: Multiplanar, multiecho pulse sequences of the brain and surrounding structures were obtained without intravenous contrast. Angiographic images of the head were obtained using MRA technique without contrast.  COMPARISON:  MRI of the brain October 18, 2014  FINDINGS: MRI HEAD FINDINGS  Residual patchy reduced diffusion RIGHT corona radiata, normalized ADC values. No new areas of acute ischemia. No susceptibility artifact to suggest hemorrhage. The ventricles and sulci are normal for patient's age. No midline shift, mass effect or mass lesions. Minimal white matter changes compatible chronic small vessel ischemic disease, less than expected for  age.  No abnormal extra-axial fluid collections. Status post bilateral ocular lens implants and probable RIGHT scleral banding. Trace paranasal sinus mucosal thickening without air-fluid levels. The mastoid air cells are well aerated. No abnormal sellar expansion. No cerebellar tonsillar ectopia. No suspicious calvarial bone marrow signal. Patient is edentulous.  MRA HEAD FINDINGS  Anterior circulation: Normal flow related enhancement of the included cervical, petrous, cavernous and supra clinoid internal carotid arteries. Patent anterior communicating artery. Normal flow related enhancement of the anterior and middle cerebral arteries, including more distal segments.  No large vessel occlusion, high-grade stenosis, aneurysm. Mild to moderate luminal irregularity of the mid to distal middle cerebral arteries.  Posterior circulation: LEFT vertebral artery is dominant. Slow flow RIGHT vertebral artery. 12 mm segment of moderate stenosis proximal LEFT V4 segment. Basilar artery is patent, with normal flow related enhancement of the main branch vessels. Small bilateral posterior communicating arteries are present. Focal high-grade  stenosis LEFT P1 origin, distal slow flow.  No large vessel occlusion, abnormal luminal irregularity, aneurysm.  IMPRESSION: MRI HEAD: No acute intracranial process.  Subacute RIGHT corona radiata/internal capsule infarct. Otherwise normal MRI of the brain for age.  MRA HEAD: No acute large vessel occlusion.  Focal high-grade stenosis LEFT P1 origin. Moderate stenosis LEFT V4 segment.  Mild to moderate luminal irregularity of the middle cerebral arteries compatible with atherosclerosis.   Electronically Signed   By: Elon Alas M.D.   On: 11/18/2014 04:01   I have personally reviewed and evaluated these images and lab results as part of my medical decision-making.   EKG Interpretation   Date/Time:  Friday November 18 2014 00:32:24 EDT Ventricular Rate:  53 PR Interval:  289 QRS Duration: 147 QT Interval:  484 QTC Calculation: 119 R Axis:   -42 Text Interpretation:  sinus bradycardia with 1st degree A-V block RBBB and  LAFB Left axis deviation No significant change since last tracing  Confirmed by Glynn Octave [phone]) on 11/18/2014 12:37:13 AM      MDM   Final diagnoses:  None    Patient presents emergency department for headache and dizziness. He states this is consistent with his previous stroke symptoms. He denies other neurological symptoms such as weakness or decreased sensation. Neurological exam here is at his baseline which reveals mild left-sided weakness. MRI was obtained and was negative for an acute stroke. Laboratory studies and EKG do not show any cause for dizziness. Patient was given IV fluids. He was advised to follow-up with his neurologist for continued management. He appears well in no acute distress, his vital signs were within his normal limits and he is safe for discharge.  Patient is bradycardic however that is his normal baseline looking back in his records.  I personally performed the services described in this documentation, which was scribed in my  presence. The recorded information has been reviewed and is accurate.    Trevor Balls, MD 11/18/14 [id number]  Trevor Balls, MD 11/18/14 [phone]

## 2014-11-17 NOTE — ED Notes (Signed)
Patient says he will go if he feels any worse. Daughter understands the concerns for going home instead of going to Emergency Department.

## 2014-11-17 NOTE — ED Provider Notes (Signed)
CSN: [id number]     Arrival date & time 11/17/14  1504 History   First MD Initiated Contact with Patient 11/17/14 1707     Chief Complaint  Patient presents with  . Dizziness   (Consider location/radiation/quality/duration/timing/severity/associated sxs/prior Treatment) HPI Comments: 81 year old male with history of hypertension, dementia, diabetes mellitus without medicinal treatment, prostate cancer and dyslipidemia presents with complaints of nausea, a faintly feeling without syncope. He complains of intermittent dizziness sometimes shortness of breath. Denies chest pain, heaviness, tightness, fullness or pressure in the chest. He states the symptoms started proximally 2 days ago. His daughter is helping guide his history. She states that he has not checked his blood sugars and several days and cannot remember any values from the past. She also states that she may have "messed up his meds". He is currently taken aspirin and Plavix. Denies any known bleeding.    Past Medical History  Diagnosis Date  . Hypertension   . Hypercholesteremia   . Arthritis   . Dementia   . Diabetes mellitus without complication   . Cancer 1998    prostate   Past Surgical History  Procedure Laterality Date  . Prostatectomy    . Cataract extraction  2011 and 2012    2011lt eye and rt eye 2012  . Glauma implant  2012    rt.eye  . Coronary angioplasty with stent placement     No family history on file. Social History  Substance Use Topics  . Smoking status: Never Smoker   . Smokeless tobacco: Never Used  . Alcohol Use: 1.2 oz/week    2 Shots of liquor per week     Comment: rare    Review of Systems  Constitutional: Positive for activity change. Negative for fever.  HENT: Negative.   Respiratory: Positive for shortness of breath. Negative for cough, choking and chest tightness.   Cardiovascular: Negative for chest pain and leg swelling.  Gastrointestinal: Positive for nausea. Negative for  vomiting, abdominal pain and abdominal distention.  Genitourinary: Negative for dysuria and frequency.  Musculoskeletal: Negative for back pain and joint swelling.  Skin: Negative.   Neurological: Positive for dizziness.    Allergies  Review of patient's allergies indicates no known allergies.  Home Medications   Prior to Admission medications   Medication Sig Start Date End Date Taking? Authorizing Provider  acetaminophen (TYLENOL) 325 MG tablet Take 2 tablets (650 mg total) by mouth every 6 (six) hours as needed for mild pain (or Fever >/= 101). 10/20/14   Kinnie Feil, MD  allopurinol (ZYLOPRIM) 100 MG tablet Take 200 mg by mouth daily.     Historical Provider, MD  amLODipine (NORVASC) 10 MG tablet Take 5 mg by mouth daily.     Historical Provider, MD  aspirin EC 81 MG tablet Take 81 mg by mouth daily.    Historical Provider, MD  atorvastatin (LIPITOR) 40 MG tablet Take 20 mg by mouth at bedtime.    Historical Provider, MD  Calcium Carbonate-Vitamin D (CALCIUM 600+D) 600-400 MG-UNIT per tablet Take 1 tablet by mouth 2 (two) times daily.    Historical Provider, MD  cholecalciferol (VITAMIN D) 1000 UNITS tablet Take 1,000 Units by mouth daily.    Historical Provider, MD  clopidogrel (PLAVIX) 75 MG tablet Take 75 mg by mouth daily.    Historical Provider, MD  dorzolamide-timolol (COSOPT) 22.3-6.8 MG/ML ophthalmic solution Place 2 drops into both eyes Twice daily. 04/21/11   Historical Provider, MD  polyvinyl alcohol (LIQUIFILM TEARS) 1.4 %  ophthalmic solution Place 1 drop into both eyes 3 (three) times daily as needed for dry eyes.    Historical Provider, MD  prednisoLONE acetate (PRED FORTE) 1 % ophthalmic suspension Place 1 drop into the right eye daily.  04/12/11   Historical Provider, MD  tamsulosin (FLOMAX) 0.4 MG CAPS capsule Take 0.4 mg by mouth at bedtime.    Historical Provider, MD   Meds Ordered and Administered this Visit  Medications - No data to display  BP 173/71 mmHg   Pulse 76  Temp(Src) 98.1 F (36.7 C) (Oral)  Resp 16  SpO2 97% No data found.   Physical Exam  Constitutional: He appears well-developed and well-nourished. No distress.  Eyes: EOM are normal.  Neck: Normal range of motion. Neck supple.  Cardiovascular: Normal rate and regular rhythm.   Heart sounds distant. S1 and S2 heard only.  Pulmonary/Chest: Effort normal and breath sounds normal. No respiratory distress. He has no wheezes. He has no rales.  Abdominal: Soft. Bowel sounds are normal. There is no tenderness.  Musculoskeletal: He exhibits no edema or tenderness.  Lymphadenopathy:    He has no cervical adenopathy.  Neurological: He is alert. He exhibits normal muscle tone.  Skin: Skin is warm and dry.  Psychiatric: He has a normal mood and affect.  Nursing note and vitals reviewed.   ED Course  Procedures (including critical care time)  Labs Review Labs Reviewed  POCT I-STAT, CHEM 8    Imaging Review No results found. ED ECG REPORT   Date: 11/17/2014  Rate: 54  Rhythm: sinus bradycardia  QRS Axis: left  Intervals: normal  ST/T Wave abnormalities: nonspecific ST changes  Conduction Disutrbances:right bundle branch block  Narrative Interpretation:   Old EKG Reviewed: unchanged  I have personally reviewed the EKG tracing and agree with the computerized printout as noted.   Visual Acuity Review  Right Eye Distance:   Left Eye Distance:   Bilateral Distance:    Right Eye Near:   Left Eye Near:    Bilateral Near:         MDM   1. Dizziness   2. Type 2 diabetes mellitus without complication   3. History of CVA (cerebrovascular accident)   4. Dyspnea   5. Syncope, near    Patient's significant other and guardian refuses to go to the emergency Department for additional evaluation. . We will offer information on his symptoms with red flags to watch for. No current change in medication. If there are any additional symptoms or worsening she go directly to  him or to department or call EMS. Patient is currently stable. EKG is unchanged, vital signs are stable, i-STAT is within normal limits.  Janne Napoleon, NP 11/17/14 [phone]

## 2014-11-17 NOTE — ED Notes (Signed)
Bed: VO35 Expected date:  Expected time:  Means of arrival:  Comments: EMS 81 yo male with headache x 2 days-nausea/no vomiting, negative stroke scale

## 2014-11-17 NOTE — ED Notes (Signed)
Patient reports feeling faint, dizzy, nauseated for a couple of days.  Patient says he has been "feeling funny", feels like "passing out.  No vomiting.  No diarrhea.  Pulse is strong and is regular.  Denies pain.

## 2014-11-18 ENCOUNTER — Emergency Department (HOSPITAL_COMMUNITY): Payer: Non-veteran care

## 2014-11-18 ENCOUNTER — Encounter (HOSPITAL_COMMUNITY): Payer: Self-pay | Admitting: *Deleted

## 2014-11-18 ENCOUNTER — Ambulatory Visit (HOSPITAL_COMMUNITY)
Admit: 2014-11-18 | Discharge: 2014-11-18 | Disposition: A | Discharge status: Home / Self Care | Payer: Non-veteran care | Attending: Emergency Medicine | Admitting: Emergency Medicine

## 2014-11-18 LAB — CBC WITH DIFFERENTIAL/PLATELET
BASOS ABS: 0 10*3/uL (ref 0.0–0.1)
Basophils Relative: 0 %
Eosinophils Absolute: 0.1 10*3/uL (ref 0.0–0.7)
Eosinophils Relative: 2 %
HEMATOCRIT: 38.8 % — AB (ref 39.0–52.0)
Hemoglobin: 13 g/dL (ref 13.0–17.0)
LYMPHS ABS: 1.7 10*3/uL (ref 0.7–4.0)
LYMPHS PCT: 33 %
MCH: 28.9 pg (ref 26.0–34.0)
MCHC: 33.5 g/dL (ref 30.0–36.0)
MCV: 86.2 fL (ref 78.0–100.0)
MONO ABS: 0.5 10*3/uL (ref 0.1–1.0)
MONOS PCT: 9 %
NEUTROS ABS: 2.9 10*3/uL (ref 1.7–7.7)
Neutrophils Relative %: 56 %
Platelets: 211 10*3/uL (ref 150–400)
RBC: 4.5 MIL/uL (ref 4.22–5.81)
RDW: 15 % (ref 11.5–15.5)
WBC: 5.3 10*3/uL (ref 4.0–10.5)

## 2014-11-18 LAB — BASIC METABOLIC PANEL
ANION GAP: 7 (ref 5–15)
BUN: 16 mg/dL (ref 6–20)
CALCIUM: 9.3 mg/dL (ref 8.9–10.3)
CO2: 25 mmol/L (ref 22–32)
Chloride: 106 mmol/L (ref 101–111)
Creatinine, Ser: 1.19 mg/dL (ref 0.61–1.24)
GFR calc Af Amer: >60 mL/min (ref >60)
GFR, EST NON AFRICAN AMERICAN: 55 mL/min — AB (ref >60)
GLUCOSE: 107 mg/dL — AB (ref 65–99)
Potassium: 3.5 mmol/L (ref 3.5–5.1)
Sodium: 138 mmol/L (ref 135–145)

## 2014-11-18 LAB — URINALYSIS, ROUTINE W REFLEX MICROSCOPIC
BILIRUBIN URINE: NEGATIVE
Glucose, UA: NEGATIVE mg/dL
HGB URINE DIPSTICK: NEGATIVE
KETONES UR: NEGATIVE mg/dL
NITRITE: NEGATIVE
PH: 5.5 (ref 5.0–8.0)
Protein, ur: NEGATIVE mg/dL
SPECIFIC GRAVITY, URINE: 1.018 (ref 1.005–1.030)
UROBILINOGEN UA: 0.2 mg/dL (ref 0.0–1.0)

## 2014-11-18 LAB — URINE MICROSCOPIC-ADD ON

## 2014-11-18 LAB — I-STAT TROPONIN, ED: TROPONIN I, POC: 0.01 ng/mL (ref 0.00–0.08)

## 2014-11-18 MED ORDER — LORAZEPAM 2 MG/ML IJ SOLN
2.0000 mg | INTRAMUSCULAR | Status: AC
  Administered 2014-11-18: 2 mg via INTRAVENOUS
  Filled 2014-11-18: qty 1

## 2014-11-18 NOTE — ED Notes (Signed)
Called Ramona, daughter, who stated she would be here to pick up her father shortly.

## 2014-11-18 NOTE — ED Notes (Signed)
Bed: YF74 Expected date:  Expected time:  Means of arrival:  Comments: MRI

## 2014-11-18 NOTE — ED Notes (Signed)
Patient transported to MRI @ [facility]

## 2014-11-18 NOTE — Discharge Instructions (Signed)
Dizziness Mr. Koelling, your MRI was normal.  See neurology within 3 days for close follow up of your dizziness.  If symptoms worsen, come back to the ED immediately. Thank you.  Dizziness means you feel unsteady or lightheaded. You might feel like you are going to pass out (faint). HOME CARE   Drink enough fluids to keep your pee (urine) clear or pale yellow.  Take your medicines exactly as told by your doctor. If you take blood pressure medicine, always stand up slowly from the lying or sitting position. Hold on to something to steady yourself.  If you need to stand in one place for a long time, move your legs often. Tighten and relax your leg muscles.  Have someone stay with you until you feel okay.  Do not drive or use heavy machinery if you feel dizzy.  Do not drink alcohol. GET HELP RIGHT AWAY IF:   You feel dizzy or lightheaded and it gets worse.  You feel sick to your stomach (nauseous), or you throw up (vomit).  You have trouble talking or walking.  You feel weak or have trouble using your arms, hands, or legs.  You cannot think clearly or have trouble forming sentences.  You have chest pain, belly (abdominal) pain, sweating, or you are short of breath.  Your vision changes.  You are bleeding.  You have problems from your medicine that seem to be getting worse. MAKE SURE YOU:   Understand these instructions.  Will watch your condition.  Will get help right away if you are not doing well or get worse. Document Released: 02/07/2011 Document Revised: 05/13/2011 Document Reviewed: 02/07/2011 [facility] Patient Information 2015 [place], [place]. This information is not intended to replace advice given to you by your health care provider. Make sure you discuss any questions you have with your health care provider.

## 2014-11-18 NOTE — ED Notes (Signed)
Patient returned from MRI with Trevor Andrews.

## 2014-11-18 NOTE — ED Notes (Signed)
Pt arrives to the ER via EMS for complaints of tightness to head; pt states that it felt like something was wrapped around his head applying pressure; pt states that the tightness is resolving; pt states that he has had a stroke in the last month and has left sided weakness; pt states that he was in a rehab facility and was just released on Fri; pt states that he was concerned since he felt the tightness in his head and his home BP machine read that his BP was high

## 2014-11-18 NOTE — ED Notes (Signed)
Ativan given for pre-med for MRI

## 2014-11-18 NOTE — ED Notes (Signed)
Pt called family member for ride home - awaiting transport to DC pt due to hx of recent stroke and pt just being dc'd from rehab

## 2014-11-23 ENCOUNTER — Ambulatory Visit (INDEPENDENT_AMBULATORY_CARE_PROVIDER_SITE_OTHER): Payer: Medicare Other | Admitting: Neurology

## 2014-11-23 ENCOUNTER — Encounter: Payer: Self-pay | Admitting: Neurology

## 2014-11-23 ENCOUNTER — Encounter (INDEPENDENT_AMBULATORY_CARE_PROVIDER_SITE_OTHER): Payer: Medicare Other | Admitting: Ophthalmology

## 2014-11-23 VITALS — BP 138/67 | HR 65 | Ht 73.0 in | Wt 223.0 lb

## 2014-11-23 DIAGNOSIS — I63311 Cerebral infarction due to thrombosis of right middle cerebral artery: Secondary | ICD-10-CM

## 2014-11-23 DIAGNOSIS — R110 Nausea: Secondary | ICD-10-CM | POA: Diagnosis not present

## 2014-11-23 DIAGNOSIS — E11321 Type 2 diabetes mellitus with mild nonproliferative diabetic retinopathy with macular edema: Secondary | ICD-10-CM

## 2014-11-23 DIAGNOSIS — R7303 Prediabetes: Secondary | ICD-10-CM | POA: Insufficient documentation

## 2014-11-23 DIAGNOSIS — E785 Hyperlipidemia, unspecified: Secondary | ICD-10-CM | POA: Diagnosis not present

## 2014-11-23 DIAGNOSIS — H34811 Central retinal vein occlusion, right eye: Secondary | ICD-10-CM | POA: Diagnosis not present

## 2014-11-23 DIAGNOSIS — I10 Essential (primary) hypertension: Secondary | ICD-10-CM | POA: Diagnosis not present

## 2014-11-23 DIAGNOSIS — E1159 Type 2 diabetes mellitus with other circulatory complications: Secondary | ICD-10-CM | POA: Diagnosis not present

## 2014-11-23 DIAGNOSIS — R42 Dizziness and giddiness: Secondary | ICD-10-CM | POA: Diagnosis not present

## 2014-11-23 DIAGNOSIS — H35033 Hypertensive retinopathy, bilateral: Secondary | ICD-10-CM | POA: Diagnosis not present

## 2014-11-23 DIAGNOSIS — E11311 Type 2 diabetes mellitus with unspecified diabetic retinopathy with macular edema: Secondary | ICD-10-CM

## 2014-11-23 DIAGNOSIS — H43813 Vitreous degeneration, bilateral: Secondary | ICD-10-CM

## 2014-11-23 MED ORDER — METOCLOPRAMIDE HCL 10 MG PO TABS: 10.0000 mg | ORAL_TABLET | Freq: Three times a day (TID) | ORAL | Status: DC | PRN

## 2014-11-23 NOTE — Progress Notes (Signed)
STROKE NEUROLOGY FOLLOW UP NOTE  NAME: Trevor Andrews DOB: 1929/06/27  REASON FOR VISIT: stroke follow up HISTORY FROM: pt and chart  Today we had the pleasure of seeing Trevor Andrews in follow-up at our Neurology Clinic. Pt was accompanied by daughter.   History Summary [name] is a 81 y.o. male with history of cognitive impairment, HTN, DM, HLD, CAD s/p stent, prostate cancer s/p surgery admitted for left LE weakness and off balance for 3 days on 10/17/14. Symptoms gradually improving.MRI showed right CR small infarct. CUS and TTE unremarkable. LDL 65 and A1C 6.6. He was continued on dual antiplatelet as well as lipitor. He was discharged to SNF in good condition.   Interval History During the interval time, the patient has been doing relatively well. Discharge from SNF after 3 weeks of rehabilitation. He stated that his left lower extremity much improved. However, he went to urgent care for dizziness without any positive findings and referred to emergency room for further evaluation. In ED, he had MRI which did not show any acute stroke, lab test and EKG did not show any cause for dizziness, no atrial fibrillation. Vital signs stable, was giving IV fluid, discharged in good condition and asked for follow-up with neurology. Today in clinic, blood pressure 138/67, heart rate 65, without acute distress. Denies headache. Dizziness seems the same with positioning changes. Stated that no room spinning, or self spinning, symptoms "difficult to describe", but seems nausea without vomiting more prominent feelings, denies abdominal pain, had bowel movement this morning, slow swallowing but no choke. No new numbness or weakness, left lower extremity muscle strengths still improving. No fall.   REVIEW OF SYSTEMS: Full 14 system review of systems performed and notable only for those listed below and in HPI above, all others are negative:  Constitutional:   Cardiovascular:  Ear/Nose/Throat:   Skin:   Eyes:   Respiratory:   Gastroitestinal:   Genitourinary:  Hematology/Lymphatic:   Endocrine:  Musculoskeletal:   Allergy/Immunology:   Neurological:  Headache  Psychiatric: Not enough sleep  Sleep: Insomnia  The following represents the patient's updated allergies and side effects list: No Known Allergies  The neurologically relevant items on the patient's problem list were reviewed on today's visit.  Neurologic Examination  A problem focused neurological exam (12 or more points of the single system neurologic examination, vital signs counts as 1 point, cranial nerves count for 8 points) was performed.  Blood pressure 138/67, pulse 65, height 6\' 1"  (1.854 m), weight 223 lb (101.152 kg).  General - Well nourished, well developed, in no apparent distress.  Ophthalmologic - fundi not visualized due to eye movement.  Cardiovascular - Regular rate and rhythm.  Mental Status -  Level of arousal and orientation to time, place, and person were intact. Language including expression, naming, repetition, comprehension was assessed and found intact. Fund of Knowledge was assessed and was intact.  Cranial Nerves II - XII - II - left eye light perception, right eye counting fingers, s/p b/l glaucoma surgery. III, IV, VI - bilateral surgical pupils, eye movement intact. V - Facial sensation intact bilaterally. VII - mild left nasolabial fold flattening. VIII - Hearing & vestibular intact bilaterally. X - Palate elevates symmetrically. XI - Chin turning & shoulder shrug intact bilaterally. XII - Tongue protrusion intact.  Motor Strength - The patient's strength was normal in all extremities except LLE 5-/5 proximal and distal and pronator drift was absent. Bulk was normal and fasciculations were  absent.  Motor Tone - Muscle tone was assessed at the neck and appendages and was normal.  Reflexes - The patient's reflexes were symmetrical in all extremities and he had no pathological  reflexes.  Sensory - Light touch, temperature/pinprick were assessed and were symmetrical.   Coordination - The patient had normal movements in the hands with no ataxia or dysmetria. Tremor was absent.  Gait and Station -walks with cane, broad-based gait, stable, no tendency to fall.  Data reviewed: I personally reviewed the images and agree with the radiology interpretations.  Ct Head Wo Contrast 10/17/2014 IMPRESSION: 1. No acute intracranial pathology seen on CT. 2. Small chronic lacunar infarct at the posterior right corona radiata, better seen than in 2011.   Mr Brain Wo Contrast 10/18/2014 Patchy area of acute ischemia RIGHT corona radiata extending into the posterior limb of the RIGHT internal capsule. Otherwise unremarkable MRI of the brain for age.   Mr Cervical Spine Wo Contrast Motion degraded examination. No acute fracture or malalignment. Degenerative change of the cervical spine. Mild to moderate canal stenosis C4-5, mild at C3-4 and C5-6. Neural foraminal narrowing C3-4 thru C6-7: Severe bilaterally at C3-4 and C5-6.   Mr Thoracic Spine Wo Contrast 10/14/2014 No thoracic spinal stenosis or acute abnormality. Mildly exaggerated thoracic kyphosis.   Mr Lumbar Spine Wo Contrast 1. Heterogeneous marrow with through the thoracolumbar spine. In this patient with presumed history of prostate cancer and prostatectomy, correlation with PSA is recommended. 2. The congenitally narrow spinal canal with superimposed degenerative disease. Posterior epidural lipomatosis. 3. Patient refused further imaging and only sagittal T1 and inversion recovery images were obtained. 4. Moderate lumbar spondylosis for age.  Carotid Doppler - Bilateral: 1-39% ICA stenosis. Vertebral artery flow is antegrade.  2D Echocardiogram - Normal LV size with mild LV hypertrophy. EF 55%. Mildly dilated RV with mildly decreased systolic function. Aortic sclerosis without significant  stenosis.  MRI and MRA 11/18/14-  MRI HEAD:  No acute intracranial process. Subacute RIGHT corona radiata/internal capsule infarct. Otherwise normal MRI of the brain for age.  MRA HEAD:  No acute large vessel occlusion. Focal high-grade stenosis LEFT P1 origin. Moderate stenosis LEFT V4 segment. Mild to moderate luminal irregularity of the middle cerebral arteries compatible with atherosclerosis.  Component     Latest Ref Rng 10/18/2014 10/19/2014  Cholesterol     0 - 200 mg/dL  129  Triglycerides     <150 mg/dL  133  HDL Cholesterol     >40 mg/dL  37 (L)  Total CHOL/HDL Ratio       3.5  VLDL     0 - 40 mg/dL  27  LDL (calc)     0 - 99 mg/dL  65  Hemoglobin A1C     4.8 - 5.6 % 6.6 (H)   Mean Plasma Glucose      143   TSH     0.350 - 4.500 uIU/mL 6.172 (H) 4.554 (H)  Free T4     0.61 - 1.12 ng/dL  0.86  T3, Total     71 - 180 ng/dL  108    Assessment: As you may recall, he is a 81 y.o. African American male with PMH of cognitive impairment, HTN, DM, HLD, CAD s/p stent, prostate cancer s/p surgery admitted for left LE weakness and off balance for on 10/17/14. MRI showed right CR small infarct. CUS and TTE unremarkable. LDL 65 and A1C 6.6. He was continued on dual antiplatelet as well as lipitor. During  the interval time, left LE weakness much improved, currently walk with cane stable gait. However, he had ER visit 1 week ago due to complaint of dizziness. MRI and MRA negative for acute abnormality. Clinically he is stable without any acute distress, no fall or imbalance on walking. Patient stated more prominent nausea feelings without vomiting. Will prescribe Reglan when necessary and follow up with PCP this Friday as scheduled.  Plan:  - Continue ASA and plavix for stroke and cardiac prevention - prescribed reglan for nausea 10 mg tid PRN and follow up with PCP - Follow up with your primary care physician for stroke risk factor modification. Recommend maintain blood pressure  goal <130/80, diabetes with hemoglobin A1c goal below 6.5% and lipids with LDL cholesterol goal below 70 mg/dL.  - check BP and glucose at home - avoid fall - RTC in 3 months.  I spent more than 25 minutes of face to face time with the patient. Greater than 50% of time was spent in counseling and coordination of care. We have discussed about stroke risk factor control, importance of complying with medication and doctor's appointment, symptoms relieve for nausea and dizziness.   No orders of the defined types were placed in this encounter.    Meds ordered this encounter  Medications  . brimonidine (ALPHAGAN) 0.2 % ophthalmic solution    Sig:     Refill:  1  . trimethoprim-polymyxin b (POLYTRIM) ophthalmic solution    Sig:     Refill:  1  . metoCLOPramide (REGLAN) 10 MG tablet    Sig: Take 1 tablet (10 mg total) by mouth every 8 (eight) hours as needed for nausea.    Dispense:  30 tablet    Refill:  0    Patient Instructions  - Continue ASA and plavix for stroke and cardiac prevention - prescribed reglan for nausea, take one tablet 3 times a day as needed and follow up with PCP - Follow up with your primary care physician for stroke risk factor modification. Recommend maintain blood pressure goal <130/80, diabetes with hemoglobin A1c goal below 6.5% and lipids with LDL cholesterol goal below 70 mg/dL.  - check BP and glucose at home - avoid fall - follow up in 3 months.    Rosalin Hawking, MD PhD [facility] [address] [place], [place] [address] [phone]

## 2014-11-23 NOTE — Patient Instructions (Addendum)
-   Continue ASA and plavix for stroke and cardiac prevention - prescribed reglan for nausea, take one tablet 3 times a day as needed and follow up with PCP - Follow up with your primary care physician for stroke risk factor modification. Recommend maintain blood pressure goal <130/80, diabetes with hemoglobin A1c goal below 6.5% and lipids with LDL cholesterol goal below 70 mg/dL.  - check BP and glucose at home - avoid fall - follow up in 3 months.

## 2014-11-24 ENCOUNTER — Telehealth: Payer: Self-pay | Admitting: Neurology

## 2014-11-24 ENCOUNTER — Telehealth: Payer: Self-pay

## 2014-11-24 NOTE — Telephone Encounter (Signed)
Deidre Garnet Koyanagi with [facility] is calling regarding the patient. Deidre needs to do a medication review regarding medications metoCLOPramide (REGLAN) 10 MG tablet and  dorzolamide-timolol (COSOPT) 22.3-6.8 MG/ML ophthalmic solution for the patient. Please call and discuss. Thank you.

## 2014-11-24 NOTE — Telephone Encounter (Signed)
Thank you for letting me know.  Rosalin Hawking, MD PhD Stroke Neurology 11/24/2014 6:24 PM

## 2014-11-24 NOTE — Telephone Encounter (Signed)
Rn call Deirdre Garnet Koyanagi with [facility] at [phone] regarding pts two medications that could possible have a minor interaction. Pt just started taking reglan 10mg  for nausea, and has cosport eye drops. Deidre stated its just protocol that if mild drug interaction comes up that md has to be notified. Deirdre stated the patient has not had any interactions, but it he does Dr.Xu will be notified. He was seen in the office this week. Rn explain that Dr. Erlinda Hong will be notified of this.

## 2014-11-25 ENCOUNTER — Ambulatory Visit (HOSPITAL_COMMUNITY): Payer: Medicare Other

## 2014-12-07 ENCOUNTER — Ambulatory Visit (HOSPITAL_COMMUNITY)
Admission: RE | Admit: 2014-12-07 | Discharge: 2014-12-07 | Disposition: A | Discharge status: Home / Self Care | Payer: Medicare Other | Source: Ambulatory Visit | Attending: Urology | Admitting: Urology

## 2014-12-07 DIAGNOSIS — M5136 Other intervertebral disc degeneration, lumbar region: Secondary | ICD-10-CM | POA: Insufficient documentation

## 2014-12-07 DIAGNOSIS — C61 Malignant neoplasm of prostate: Secondary | ICD-10-CM | POA: Insufficient documentation

## 2014-12-07 MED ORDER — TECHNETIUM TC 99M MEDRONATE IV KIT
26.4000 | PACK | Freq: Once | INTRAVENOUS | Status: AC | PRN
  Administered 2014-12-07: 26.4 via INTRAVENOUS

## 2014-12-24 ENCOUNTER — Emergency Department (HOSPITAL_COMMUNITY)
Admission: EM | Admit: 2014-12-24 | Discharge: 2014-12-24 | Disposition: A | Discharge status: Home / Self Care | Payer: Medicare Other | Attending: Emergency Medicine | Admitting: Emergency Medicine

## 2014-12-24 ENCOUNTER — Emergency Department (HOSPITAL_COMMUNITY): Payer: Medicare Other

## 2014-12-24 DIAGNOSIS — R0602 Shortness of breath: Secondary | ICD-10-CM | POA: Insufficient documentation

## 2014-12-24 DIAGNOSIS — Z9842 Cataract extraction status, left eye: Secondary | ICD-10-CM | POA: Insufficient documentation

## 2014-12-24 DIAGNOSIS — Z7952 Long term (current) use of systemic steroids: Secondary | ICD-10-CM | POA: Insufficient documentation

## 2014-12-24 DIAGNOSIS — Z9841 Cataract extraction status, right eye: Secondary | ICD-10-CM | POA: Insufficient documentation

## 2014-12-24 DIAGNOSIS — Z8546 Personal history of malignant neoplasm of prostate: Secondary | ICD-10-CM | POA: Diagnosis not present

## 2014-12-24 DIAGNOSIS — Z9861 Coronary angioplasty status: Secondary | ICD-10-CM | POA: Insufficient documentation

## 2014-12-24 DIAGNOSIS — Z79899 Other long term (current) drug therapy: Secondary | ICD-10-CM | POA: Diagnosis not present

## 2014-12-24 DIAGNOSIS — R51 Headache: Secondary | ICD-10-CM | POA: Diagnosis not present

## 2014-12-24 DIAGNOSIS — F0390 Unspecified dementia without behavioral disturbance: Secondary | ICD-10-CM | POA: Insufficient documentation

## 2014-12-24 DIAGNOSIS — Z8673 Personal history of transient ischemic attack (TIA), and cerebral infarction without residual deficits: Secondary | ICD-10-CM | POA: Diagnosis not present

## 2014-12-24 DIAGNOSIS — R42 Dizziness and giddiness: Secondary | ICD-10-CM

## 2014-12-24 DIAGNOSIS — Z7982 Long term (current) use of aspirin: Secondary | ICD-10-CM | POA: Diagnosis not present

## 2014-12-24 DIAGNOSIS — E7800 Pure hypercholesterolemia, unspecified: Secondary | ICD-10-CM | POA: Diagnosis not present

## 2014-12-24 DIAGNOSIS — E119 Type 2 diabetes mellitus without complications: Secondary | ICD-10-CM | POA: Diagnosis not present

## 2014-12-24 DIAGNOSIS — M1990 Unspecified osteoarthritis, unspecified site: Secondary | ICD-10-CM | POA: Diagnosis not present

## 2014-12-24 DIAGNOSIS — I10 Essential (primary) hypertension: Secondary | ICD-10-CM | POA: Diagnosis not present

## 2014-12-24 DIAGNOSIS — R110 Nausea: Secondary | ICD-10-CM | POA: Diagnosis not present

## 2014-12-24 DIAGNOSIS — Z7902 Long term (current) use of antithrombotics/antiplatelets: Secondary | ICD-10-CM | POA: Insufficient documentation

## 2014-12-24 LAB — CBC
HCT: 40 % (ref 39.0–52.0)
Hemoglobin: 13.5 g/dL (ref 13.0–17.0)
MCH: 29.2 pg (ref 26.0–34.0)
MCHC: 33.8 g/dL (ref 30.0–36.0)
MCV: 86.6 fL (ref 78.0–100.0)
PLATELETS: 223 10*3/uL (ref 150–400)
RBC: 4.62 MIL/uL (ref 4.22–5.81)
RDW: 14.9 % (ref 11.5–15.5)
WBC: 5 10*3/uL (ref 4.0–10.5)

## 2014-12-24 LAB — I-STAT TROPONIN, ED: TROPONIN I, POC: 0 ng/mL (ref 0.00–0.08)

## 2014-12-24 LAB — BASIC METABOLIC PANEL
ANION GAP: 6 (ref 5–15)
BUN: 15 mg/dL (ref 6–20)
CALCIUM: 9.5 mg/dL (ref 8.9–10.3)
CO2: 28 mmol/L (ref 22–32)
Chloride: 104 mmol/L (ref 101–111)
Creatinine, Ser: 1.17 mg/dL (ref 0.61–1.24)
GFR calc Af Amer: >60 mL/min (ref >60)
GFR, EST NON AFRICAN AMERICAN: 56 mL/min — AB (ref >60)
Glucose, Bld: 90 mg/dL (ref 65–99)
POTASSIUM: 4.3 mmol/L (ref 3.5–5.1)
SODIUM: 138 mmol/L (ref 135–145)

## 2014-12-24 MED ORDER — MECLIZINE HCL 50 MG PO TABS: 50.0000 mg | ORAL_TABLET | Freq: Two times a day (BID) | ORAL | Status: DC

## 2014-12-24 MED ORDER — MECLIZINE HCL 25 MG PO TABS
25.0000 mg | ORAL_TABLET | Freq: Once | ORAL | Status: AC
  Administered 2014-12-24: 25 mg via ORAL
  Filled 2014-12-24: qty 1

## 2014-12-24 NOTE — ED Provider Notes (Signed)
CSN: [id number]     Arrival date & time 12/24/14  1523 History   First MD Initiated Contact with Patient 12/24/14 1732     Chief Complaint  Patient presents with  . Dizziness  . Shortness of Breath  . Nausea     (Consider location/radiation/quality/duration/timing/severity/associated sxs/prior Treatment) HPI Comments: Patient with past medical history of recent stroke, hypertension, hyperlipidemia, diabetes, A. fib, and vertigo presents to the emergency department with chief complaint of dizziness and shortness of breath. He reports that he recently had a stroke about 2 months ago. He states that he has had some HA and dizziness/lightheadedness since having the stroke.  Also reports daily nausea, which is also not new.  He states that today he began having some SOB.  His daughter reports that he has had a cough as well, but he denies any fevers or chills.  He takes a baby aspirin and plavix daily, but otherwise is not anticoagulated.  He denies any new numbness, weakness, or tingling in his extremities.  There are no aggravating or alleviating factors.  The history is provided by the patient. No language interpreter was used.    Past Medical History  Diagnosis Date  . Hypertension   . Hypercholesteremia   . Arthritis   . Dementia   . Diabetes mellitus without complication   . Cancer 1998    prostate  . Stroke   . Atrial fibrillation   . Vertigo    Past Surgical History  Procedure Laterality Date  . Prostatectomy    . Cataract extraction  2011 and 2012    2011lt eye and rt eye 2012  . Glauma implant  2012    rt.eye  . Coronary angioplasty with stent placement     No family history on file. Social History  Substance Use Topics  . Smoking status: Never Smoker   . Smokeless tobacco: Never Used  . Alcohol Use: 1.2 oz/week    2 Shots of liquor per week     Comment: rare    Review of Systems  Constitutional: Negative for fever and chills.  Respiratory: Positive for cough  and shortness of breath.   Cardiovascular: Negative for chest pain.  Gastrointestinal: Negative for nausea, vomiting, diarrhea and constipation.  Genitourinary: Negative for dysuria.  Neurological: Positive for dizziness, light-headedness and headaches.  All other systems reviewed and are negative.     Allergies  Review of patient's allergies indicates no known allergies.  Home Medications   Prior to Admission medications   Medication Sig Start Date End Date Taking? Authorizing Provider  acetaminophen (TYLENOL) 325 MG tablet Take 2 tablets (650 mg total) by mouth every 6 (six) hours as needed for mild pain (or Fever >/= 101). 10/20/14   Kinnie Feil, MD  allopurinol (ZYLOPRIM) 100 MG tablet Take 200 mg by mouth daily.     Historical Provider, MD  amLODipine (NORVASC) 10 MG tablet Take 5 mg by mouth daily.     Historical Provider, MD  aspirin EC 81 MG tablet Take 81 mg by mouth daily.    Historical Provider, MD  atorvastatin (LIPITOR) 40 MG tablet Take 20 mg by mouth at bedtime.    Historical Provider, MD  brimonidine (ALPHAGAN) 0.2 % ophthalmic solution  11/12/14   Historical Provider, MD  Calcium Carbonate-Vitamin D (CALCIUM 600+D) 600-400 MG-UNIT per tablet Take 1 tablet by mouth 2 (two) times daily.    Historical Provider, MD  cholecalciferol (VITAMIN D) 1000 UNITS tablet Take 1,000 Units by mouth  daily.    Historical Provider, MD  clopidogrel (PLAVIX) 75 MG tablet Take 75 mg by mouth daily.    Historical Provider, MD  dorzolamide-timolol (COSOPT) 22.3-6.8 MG/ML ophthalmic solution Place 2 drops into both eyes Twice daily. 04/21/11   Historical Provider, MD  metoCLOPramide (REGLAN) 10 MG tablet Take 1 tablet (10 mg total) by mouth every 8 (eight) hours as needed for nausea. 11/23/14   Rosalin Hawking, MD  polyvinyl alcohol (LIQUIFILM TEARS) 1.4 % ophthalmic solution Place 1 drop into both eyes 3 (three) times daily as needed for dry eyes.    Historical Provider, MD  prednisoLONE acetate  (PRED FORTE) 1 % ophthalmic suspension Place 1 drop into the right eye daily.  04/12/11   Historical Provider, MD  tamsulosin (FLOMAX) 0.4 MG CAPS capsule Take 0.4 mg by mouth at bedtime.    Historical Provider, MD  trimethoprim-polymyxin b (POLYTRIM) ophthalmic solution  11/12/14   Historical Provider, MD   BP 132/68 mmHg  Pulse 72  Temp(Src) 98.1 F (36.7 C) (Oral)  Resp 18  SpO2 100% Physical Exam  Constitutional: He is oriented to person, place, and time. He appears well-developed and well-nourished.  HENT:  Head: Normocephalic and atraumatic.  Eyes: Conjunctivae and EOM are normal. Pupils are equal, round, and reactive to light. Right eye exhibits no discharge. Left eye exhibits no discharge. No scleral icterus.  Neck: Normal range of motion. Neck supple. No JVD present.  Cardiovascular: Normal rate, regular rhythm and normal heart sounds.  Exam reveals no gallop and no friction rub.   No murmur heard. Pulmonary/Chest: Effort normal and breath sounds normal. No respiratory distress. He has no wheezes. He has no rales. He exhibits no tenderness.  Abdominal: Soft. He exhibits no distension and no mass. There is no tenderness. There is no rebound and no guarding.  Musculoskeletal: Normal range of motion. He exhibits no edema or tenderness.  Moves all extremities  Neurological: He is alert and oriented to person, place, and time.  CN 3-12 intact, speech is clear, movements are goal oriented  Skin: Skin is warm and dry.  Psychiatric: He has a normal mood and affect. His behavior is normal. Judgment and thought content normal.  Nursing note and vitals reviewed.   ED Course  Procedures (including critical care time) Results for orders placed or performed during the hospital encounter of 12/24/14  CBC  Result Value Ref Range   WBC 5.0 4.0 - 10.5 K/uL   RBC 4.62 4.22 - 5.81 MIL/uL   Hemoglobin 13.5 13.0 - 17.0 g/dL   HCT 40.0 39.0 - 52.0 %   MCV 86.6 78.0 - 100.0 fL   MCH 29.2 26.0 -  34.0 pg   MCHC 33.8 30.0 - 36.0 g/dL   RDW 14.9 11.5 - 15.5 %   Platelets 223 150 - 400 K/uL  Basic metabolic panel  Result Value Ref Range   Sodium 138 135 - 145 mmol/L   Potassium 4.3 3.5 - 5.1 mmol/L   Chloride 104 101 - 111 mmol/L   CO2 28 22 - 32 mmol/L   Glucose, Bld 90 65 - 99 mg/dL   BUN 15 6 - 20 mg/dL   Creatinine, Ser 1.17 0.61 - 1.24 mg/dL   Calcium 9.5 8.9 - 10.3 mg/dL   GFR calc non Af Amer 56 (L) >60 mL/min   GFR calc Af Amer >60 >60 mL/min   Anion gap 6 5 - 15  I-Stat Troponin, ED (not at [facility])  Result Value Ref Range  Troponin i, poc 0.00 0.00 - 0.08 ng/mL   Comment 3           Dg Chest 2 View  12/24/2014  CLINICAL DATA:  Shortness of breath, dizziness and nausea, onset this morning. EXAM: CHEST  2 VIEW COMPARISON:  11/18/2014 FINDINGS: Chronic of the left hemidiaphragm. The heart size is normal, mild tortuosity of the thoracic aorta. The lungs are clear. There is no consolidation, pleural effusion or pneumothorax. Degenerative change in the spine. IMPRESSION: No acute pulmonary process. Chronic elevation of left hemidiaphragm. Electronically Signed   By: Jeb Levering M.D.   On: 12/24/2014 18:48   Nm Bone Scan Whole Body  12/07/2014  CLINICAL DATA:  81 year old male with prostate cancer. Denies bone pain. Heterogeneous bone marrow signal in the thoracic and lumbar spine. Subsequent encounter. EXAM: NUCLEAR MEDICINE WHOLE BODY BONE SCAN TECHNIQUE: Whole body anterior and posterior images were obtained approximately 3 hours after intravenous injection of radiopharmaceutical. RADIOPHARMACEUTICALS:  26.4 mCi Technetium-47m MDP IV COMPARISON:  Cervical spine MRI 10/18/2014. Thoracic and lumbar MRI 10/14/2014. FINDINGS: Expected radiotracer activity in the kidneys and bladder. Spinal activity appears homogeneous except at mid lumbar right vertebral body endplates. The morphology of activity is most suggestive of degenerative etiology. This is approximately at L3-L4. Mildly  heterogeneous activity about both sternoclavicular, acromioclavicular, and glenohumeral joints most suggestive of degenerative activity. Homogeneous activity in the ribs, skull, and elsewhere in the visualized upper extremities. Homogeneous activity about the pelvis. Incidental perineum contamination. Heterogeneous increased activity about both knees which appears to be degenerative in nature. Mild similar activity in the right ankle/ foot. Otherwise lower extremity activity is within normal limits. IMPRESSION: 1.  No findings specific for metastatic disease to bone. 2. Degenerative appearing activity affecting right lateral mid lumbar level vertebral endplates, as well as at both shoulders and knees. Electronically Signed   By: Genevie Ann M.D.   On: 12/07/2014 14:02     Imaging Review Dg Chest 2 View  12/24/2014  CLINICAL DATA:  Shortness of breath, dizziness and nausea, onset this morning. EXAM: CHEST  2 VIEW COMPARISON:  11/18/2014 FINDINGS: Chronic of the left hemidiaphragm. The heart size is normal, mild tortuosity of the thoracic aorta. The lungs are clear. There is no consolidation, pleural effusion or pneumothorax. Degenerative change in the spine. IMPRESSION: No acute pulmonary process. Chronic elevation of left hemidiaphragm. Electronically Signed   By: Jeb Levering M.D.   On: 12/24/2014 18:48   I have personally reviewed and evaluated these images and lab results as part of my medical decision-making.   EKG Interpretation   Date/Time:  Saturday December 24 2014 15:50:33 EDT Ventricular Rate:  69 PR Interval:  252 QRS Duration: 140 QT Interval:  436 QTC Calculation: 467 R Axis:   -59 Text Interpretation:  Sinus rhythm Prolonged PR interval RBBB and LAFB LVH  by voltage Confirmed by Lacinda Axon  MD, BRIAN ([id number]) on 12/24/2014 7:33:50 PM      MDM   Final diagnoses:  Dizziness  Nausea  SOB (shortness of breath)    Patient with intermittent dizziness. He states he has had symptoms  since he had a stroke about 2 months ago. States that it comes and goes. States that he noticed it was a little stronger this morning. He reports having some associated nausea.  He also states that he had some shortness breath. Seen by and discussed with Dr. Lacinda Axon, who agrees with workup plan.  Labs are reassuring, chest x-ray is negative, orthostatic vital signs  are normal.  Discussed labs with Dr. Lacinda Axon, who recommends discharge to home with PCP/neurology follow-up. Patient's symptoms seem to be more chronic in nature. They seem to be relatively unchanged from when he had his initial stroke. Additionally, he suffers from vertigo, which could be adding to his symptoms. I will give him his prescription for meclizine. He understands and agrees to plan. His daughter who is with him also understands and agrees to plan. He is stable and ready for discharge.    Montine Circle, PA-C 12/24/14 [place], MD 12/24/14 2213

## 2014-12-24 NOTE — ED Notes (Addendum)
Patient reports feeling dizzy, short of breath, and nausea. Hx of a stroke 2 months ago, CAD, HTN, prediabetes, Hypercholestrolemia. Pt. Daughter reports that the asymmetry present is his baseline from a previous stroke.

## 2014-12-24 NOTE — ED Notes (Signed)
Pt reported dizziness and nausea started this morning. Family at bedside. SR on monitor.

## 2014-12-24 NOTE — Discharge Instructions (Signed)

## 2015-01-12 ENCOUNTER — Telehealth: Payer: Self-pay | Admitting: Neurology

## 2015-01-12 DIAGNOSIS — R42 Dizziness and giddiness: Secondary | ICD-10-CM

## 2015-01-12 NOTE — Telephone Encounter (Signed)
Daughter Donnald Garre called to request 1 month refill of meclizine (ANTIVERT) 50 MG tablet. States the medicine seems to be working.

## 2015-01-12 NOTE — Telephone Encounter (Signed)
Patient was prescribed Antivert at ED.  Daughter feels it has been working well and would like to know if Dr Trevor Andrews would be agreeable to continue filling this med.  Please advise.  Thank you.

## 2015-01-16 NOTE — Telephone Encounter (Signed)
I called back.  Phone rang nearly 20 times with no answer.  Message picked up and said your party is not answering, your call will now be disconnected.  I called cell listed as well, but got no answer.

## 2015-01-16 NOTE — Telephone Encounter (Signed)
Daughter Donnald Garre called back to check status of meclizine refill. Wants to know how she'll know when it's ready.

## 2015-01-17 DIAGNOSIS — R42 Dizziness and giddiness: Secondary | ICD-10-CM | POA: Insufficient documentation

## 2015-01-17 MED ORDER — MECLIZINE HCL 50 MG PO TABS: 50.0000 mg | ORAL_TABLET | Freq: Three times a day (TID) | ORAL | Status: DC | PRN

## 2015-01-17 NOTE — Telephone Encounter (Signed)
I called the pt daughter and discussed with her over the phone. She stated that pt was seen in ED for N/V and dizziness and was told to have vertigo. Prescribed meclizine. He seems getting better but meclizine finished in one week. So far pt is off the medication but he does not complain of any dizziness now. Daughter still thinking pt may have symptoms but just did not say. She still wants medication to be refilled. I told her that this medication to take for long term will cause confusion and cognitive impairment in elderly pt. That is why we do not want pt to have long term. I will give him 10 day supply but use as PRN bases. Daughter expressed understanding and appreciation.  Rosalin Hawking, MD PhD Stroke Neurology 01/17/2015 5:58 PM  Meds ordered this encounter  Medications  . meclizine (ANTIVERT) 50 MG tablet    Sig: Take 1 tablet (50 mg total) by mouth 3 (three) times daily as needed.    Dispense:  30 tablet    Refill:  0

## 2015-03-13 ENCOUNTER — Ambulatory Visit: Payer: Medicare Other | Admitting: Neurology

## 2015-03-29 ENCOUNTER — Ambulatory Visit (INDEPENDENT_AMBULATORY_CARE_PROVIDER_SITE_OTHER): Payer: Medicare Other | Admitting: Ophthalmology

## 2015-04-18 ENCOUNTER — Encounter: Payer: Self-pay | Admitting: Gastroenterology

## 2015-10-18 ENCOUNTER — Emergency Department (HOSPITAL_COMMUNITY): Payer: Medicare Other

## 2015-10-18 ENCOUNTER — Observation Stay (HOSPITAL_COMMUNITY)
Admission: EM | Admit: 2015-10-18 | Discharge: 2015-10-19 | Disposition: A | Discharge status: Home / Self Care | Payer: Medicare Other | Attending: Internal Medicine | Admitting: Internal Medicine

## 2015-10-18 ENCOUNTER — Encounter (HOSPITAL_COMMUNITY): Payer: Self-pay | Admitting: Emergency Medicine

## 2015-10-18 DIAGNOSIS — Z8673 Personal history of transient ischemic attack (TIA), and cerebral infarction without residual deficits: Secondary | ICD-10-CM | POA: Diagnosis not present

## 2015-10-18 DIAGNOSIS — R7303 Prediabetes: Secondary | ICD-10-CM | POA: Diagnosis present

## 2015-10-18 DIAGNOSIS — I2510 Atherosclerotic heart disease of native coronary artery without angina pectoris: Secondary | ICD-10-CM | POA: Insufficient documentation

## 2015-10-18 DIAGNOSIS — I10 Essential (primary) hypertension: Secondary | ICD-10-CM | POA: Diagnosis present

## 2015-10-18 DIAGNOSIS — R079 Chest pain, unspecified: Secondary | ICD-10-CM | POA: Diagnosis not present

## 2015-10-18 DIAGNOSIS — N183 Chronic kidney disease, stage 3 unspecified: Secondary | ICD-10-CM | POA: Diagnosis present

## 2015-10-18 DIAGNOSIS — Z8546 Personal history of malignant neoplasm of prostate: Secondary | ICD-10-CM | POA: Diagnosis not present

## 2015-10-18 DIAGNOSIS — Z79899 Other long term (current) drug therapy: Secondary | ICD-10-CM | POA: Insufficient documentation

## 2015-10-18 DIAGNOSIS — Z7982 Long term (current) use of aspirin: Secondary | ICD-10-CM | POA: Insufficient documentation

## 2015-10-18 DIAGNOSIS — E1159 Type 2 diabetes mellitus with other circulatory complications: Secondary | ICD-10-CM | POA: Insufficient documentation

## 2015-10-18 DIAGNOSIS — I130 Hypertensive heart and chronic kidney disease with heart failure and stage 1 through stage 4 chronic kidney disease, or unspecified chronic kidney disease: Secondary | ICD-10-CM | POA: Insufficient documentation

## 2015-10-18 DIAGNOSIS — R0789 Other chest pain: Principal | ICD-10-CM | POA: Insufficient documentation

## 2015-10-18 DIAGNOSIS — R531 Weakness: Secondary | ICD-10-CM | POA: Diagnosis not present

## 2015-10-18 DIAGNOSIS — Z955 Presence of coronary angioplasty implant and graft: Secondary | ICD-10-CM

## 2015-10-18 DIAGNOSIS — I5032 Chronic diastolic (congestive) heart failure: Secondary | ICD-10-CM

## 2015-10-18 DIAGNOSIS — E7800 Pure hypercholesterolemia, unspecified: Secondary | ICD-10-CM | POA: Diagnosis present

## 2015-10-18 DIAGNOSIS — I452 Bifascicular block: Secondary | ICD-10-CM

## 2015-10-18 DIAGNOSIS — R404 Transient alteration of awareness: Secondary | ICD-10-CM | POA: Diagnosis not present

## 2015-10-18 HISTORY — DX: Type 2 diabetes mellitus without complications: E11.9

## 2015-10-18 HISTORY — DX: Gastro-esophageal reflux disease without esophagitis: K21.9

## 2015-10-18 HISTORY — DX: Reserved for inherently not codable concepts without codable children: IMO0001

## 2015-10-18 HISTORY — DX: Heart failure, unspecified: I50.9

## 2015-10-18 HISTORY — DX: Atherosclerotic heart disease of native coronary artery without angina pectoris: I25.10

## 2015-10-18 HISTORY — DX: Chronic kidney disease, stage 2 (mild): N18.2

## 2015-10-18 LAB — COMPREHENSIVE METABOLIC PANEL
ALBUMIN: 3.4 g/dL — AB (ref 3.5–5.0)
ALK PHOS: 53 U/L (ref 38–126)
ALT: 14 U/L — AB (ref 17–63)
ANION GAP: 7 (ref 5–15)
AST: 16 U/L (ref 15–41)
BUN: 20 mg/dL (ref 6–20)
CALCIUM: 9.5 mg/dL (ref 8.9–10.3)
CHLORIDE: 105 mmol/L (ref 101–111)
CO2: 26 mmol/L (ref 22–32)
CREATININE: 1.41 mg/dL — AB (ref 0.61–1.24)
GFR calc Af Amer: 51 mL/min — ABNORMAL LOW (ref >60)
GFR calc non Af Amer: 44 mL/min — ABNORMAL LOW (ref >60)
GLUCOSE: 103 mg/dL — AB (ref 65–99)
Potassium: 3.7 mmol/L (ref 3.5–5.1)
SODIUM: 138 mmol/L (ref 135–145)
Total Bilirubin: 0.5 mg/dL (ref 0.3–1.2)
Total Protein: 6.9 g/dL (ref 6.5–8.1)

## 2015-10-18 LAB — I-STAT TROPONIN, ED: Troponin i, poc: 0 ng/mL (ref 0.00–0.08)

## 2015-10-18 LAB — CBC
HCT: 39.1 % (ref 39.0–52.0)
HEMOGLOBIN: 13 g/dL (ref 13.0–17.0)
MCH: 29.7 pg (ref 26.0–34.0)
MCHC: 33.2 g/dL (ref 30.0–36.0)
MCV: 89.3 fL (ref 78.0–100.0)
Platelets: 201 10*3/uL (ref 150–400)
RBC: 4.38 MIL/uL (ref 4.22–5.81)
RDW: 14.5 % (ref 11.5–15.5)
WBC: 5.2 10*3/uL (ref 4.0–10.5)

## 2015-10-18 LAB — BRAIN NATRIURETIC PEPTIDE: B NATRIURETIC PEPTIDE 5: 26.3 pg/mL (ref 0.0–100.0)

## 2015-10-18 LAB — TROPONIN I

## 2015-10-18 LAB — CBG MONITORING, ED: GLUCOSE-CAPILLARY: 92 mg/dL (ref 65–99)

## 2015-10-18 LAB — TSH: TSH: 4.074 u[IU]/mL (ref 0.350–4.500)

## 2015-10-18 MED ORDER — GI COCKTAIL ~~LOC~~
30.0000 mL | Freq: Three times a day (TID) | ORAL | Status: DC | PRN
  Administered 2015-10-19: 30 mL via ORAL
  Filled 2015-10-18: qty 30

## 2015-10-18 MED ORDER — SODIUM CHLORIDE 0.9 % IV SOLN
INTRAVENOUS | Status: AC
  Administered 2015-10-19: 01:00:00 via INTRAVENOUS

## 2015-10-18 MED ORDER — ASPIRIN 81 MG PO CHEW
324.0000 mg | CHEWABLE_TABLET | Freq: Once | ORAL | Status: AC
  Administered 2015-10-18: 324 mg via ORAL
  Filled 2015-10-18: qty 4

## 2015-10-18 MED ORDER — ACETAMINOPHEN 325 MG PO TABS: 650.0000 mg | ORAL_TABLET | Freq: Four times a day (QID) | ORAL | Status: DC | PRN

## 2015-10-18 MED ORDER — PREDNISOLONE ACETATE 1 % OP SUSP
1.0000 [drp] | Freq: Every day | OPHTHALMIC | Status: DC
  Administered 2015-10-19: 1 [drp] via OPHTHALMIC
  Filled 2015-10-18 (×2): qty 1

## 2015-10-18 MED ORDER — ENOXAPARIN SODIUM 40 MG/0.4ML ~~LOC~~ SOLN
40.0000 mg | Freq: Every day | SUBCUTANEOUS | Status: DC
  Administered 2015-10-19: 40 mg via SUBCUTANEOUS
  Filled 2015-10-18: qty 0.4

## 2015-10-18 MED ORDER — TAMSULOSIN HCL 0.4 MG PO CAPS
0.4000 mg | ORAL_CAPSULE | Freq: Every day | ORAL | Status: DC
  Administered 2015-10-19: 0.4 mg via ORAL
  Filled 2015-10-18: qty 1

## 2015-10-18 MED ORDER — ALLOPURINOL 100 MG PO TABS
200.0000 mg | ORAL_TABLET | Freq: Every day | ORAL | Status: DC
  Administered 2015-10-19: 200 mg via ORAL
  Filled 2015-10-18: qty 2

## 2015-10-18 MED ORDER — MECLIZINE HCL 25 MG PO TABS: 50.0000 mg | ORAL_TABLET | Freq: Three times a day (TID) | ORAL | Status: DC | PRN

## 2015-10-18 MED ORDER — CLOPIDOGREL BISULFATE 75 MG PO TABS
75.0000 mg | ORAL_TABLET | Freq: Every day | ORAL | Status: DC
  Administered 2015-10-19: 75 mg via ORAL
  Filled 2015-10-18: qty 1

## 2015-10-18 MED ORDER — MORPHINE SULFATE (PF) 2 MG/ML IV SOLN: 2.0000 mg | INTRAVENOUS | Status: DC | PRN

## 2015-10-18 MED ORDER — ACETAMINOPHEN 325 MG PO TABS: 650.0000 mg | ORAL_TABLET | ORAL | Status: DC | PRN

## 2015-10-18 MED ORDER — AMLODIPINE BESYLATE 5 MG PO TABS
10.0000 mg | ORAL_TABLET | Freq: Every day | ORAL | Status: DC
  Administered 2015-10-19: 10 mg via ORAL
  Filled 2015-10-18: qty 2

## 2015-10-18 MED ORDER — DORZOLAMIDE HCL-TIMOLOL MAL 2-0.5 % OP SOLN
1.0000 [drp] | Freq: Two times a day (BID) | OPHTHALMIC | Status: DC
  Administered 2015-10-19 (×2): 1 [drp] via OPHTHALMIC
  Filled 2015-10-18: qty 10

## 2015-10-18 MED ORDER — POLYVINYL ALCOHOL 1.4 % OP SOLN: 1.0000 [drp] | OPHTHALMIC | Status: DC | PRN

## 2015-10-18 MED ORDER — CALCIUM CARBONATE-VITAMIN D 500-200 MG-UNIT PO TABS
1.0000 | ORAL_TABLET | Freq: Two times a day (BID) | ORAL | Status: DC
  Administered 2015-10-19: 1 via ORAL
  Filled 2015-10-18: qty 1

## 2015-10-18 MED ORDER — BRIMONIDINE TARTRATE 0.2 % OP SOLN
1.0000 [drp] | Freq: Three times a day (TID) | OPHTHALMIC | Status: DC
  Administered 2015-10-19 (×2): 1 [drp] via OPHTHALMIC
  Filled 2015-10-18: qty 5

## 2015-10-18 MED ORDER — HYDROCODONE-ACETAMINOPHEN 5-325 MG PO TABS: 1.0000 | ORAL_TABLET | Freq: Four times a day (QID) | ORAL | Status: DC | PRN

## 2015-10-18 MED ORDER — NITROGLYCERIN 0.4 MG SL SUBL: 0.4000 mg | SUBLINGUAL_TABLET | SUBLINGUAL | Status: DC | PRN

## 2015-10-18 MED ORDER — FAMOTIDINE IN NACL 20-0.9 MG/50ML-% IV SOLN
20.0000 mg | Freq: Two times a day (BID) | INTRAVENOUS | Status: DC
  Administered 2015-10-19: 20 mg via INTRAVENOUS
  Filled 2015-10-18: qty 50

## 2015-10-18 MED ORDER — ATORVASTATIN CALCIUM 40 MG PO TABS
40.0000 mg | ORAL_TABLET | Freq: Every day | ORAL | Status: DC
  Administered 2015-10-19: 40 mg via ORAL
  Filled 2015-10-18: qty 1

## 2015-10-18 MED ORDER — ONDANSETRON HCL 4 MG/2ML IJ SOLN: 4.0000 mg | Freq: Four times a day (QID) | INTRAMUSCULAR | Status: DC | PRN

## 2015-10-18 MED ORDER — ASPIRIN EC 81 MG PO TBEC
81.0000 mg | DELAYED_RELEASE_TABLET | Freq: Every day | ORAL | Status: DC
  Administered 2015-10-19: 81 mg via ORAL
  Filled 2015-10-18: qty 1

## 2015-10-18 MED ORDER — GI COCKTAIL ~~LOC~~
30.0000 mL | Freq: Once | ORAL | Status: AC
  Administered 2015-10-18: 30 mL via ORAL
  Filled 2015-10-18: qty 30

## 2015-10-18 NOTE — ED Provider Notes (Signed)
[facility] DEPT Provider Note   CSN: [id number] Arrival date & time: 10/18/15  1821     History   Chief Complaint Chief Complaint  Patient presents with  . Other    chest discomfort    HPI Trevor Andrews is a 82 y.o. male.  HPI 82 year old male who presents with chest discomfort.  History of CAD s/p stent, DM, HTN, HLD, atrial fibrillation, and CVA w/ residual left sided hemiparesis. On and off "indigestion" discomfort in the left side of his chest. Usually goes away with maloox or antacid. Episode started upon waking this morning and has not gone away. Associated with mild dyspnea. No n/v, abd pain, diarrhea, diaphoresis or syncope. Does feel a little dizzy. Not taken care of self recently and having stress as two family members recently passed away over past 6 months. No cough, fever or chills. No LE edema or pain. No DOE, exertional symptoms.   Past Medical History:  Diagnosis Date  . Arthritis   . Atrial fibrillation ([place])   . Cancer [facility]) 1998   prostate  . Dementia   . Diabetes mellitus without complication ([place])   . Hypercholesteremia   . Hypertension   . Stroke ([place])   . Vertigo     Patient Active Problem List   Diagnosis Date Noted  . Chest pain 10/18/2015  . CKD (chronic kidney disease), stage III 10/18/2015  . Chronic diastolic CHF (congestive heart failure) ([place]) 10/18/2015  . Nonspecific chest pain 10/18/2015  . Dizziness and giddiness 01/17/2015  . Nausea without vomiting 11/23/2014  . Essential hypertension 11/23/2014  . Type 2 diabetes mellitus with other circulatory complications ([place]) [id number]  . HLD (hyperlipidemia) 11/23/2014  . Dizziness 11/23/2014  . Gait apraxia 10/18/2014  . Hypertension 10/18/2014  . Hypercholesteremia 10/18/2014  . Dementia 10/18/2014  . History of coronary artery stent placement 10/18/2014  . Gait abnormality 10/18/2014  . History of arterial ischemic stroke 10/18/2014  . Neurologic gait dysfunction   . Stroke  with cerebral ischemia ([place])   . Cerebral infarction due to thrombosis of right middle cerebral artery [facility])     Past Surgical History:  Procedure Laterality Date  . CATARACT EXTRACTION  2011 and 2012   2011lt eye and rt eye 2012  . CORONARY ANGIOPLASTY WITH STENT PLACEMENT    . glauma implant  2012   rt.eye  . PROSTATECTOMY         Home Medications    Prior to Admission medications   Medication Sig Start Date End Date Taking? Authorizing Provider  acetaminophen (TYLENOL) 325 MG tablet Take 2 tablets (650 mg total) by mouth every 6 (six) hours as needed for mild pain (or Fever >/= 101). 10/20/14  Yes Kinnie Feil, MD  allopurinol (ZYLOPRIM) 100 MG tablet Take 200 mg by mouth daily.    Yes Historical Provider, MD  amLODipine (NORVASC) 10 MG tablet Take 10 mg by mouth daily.    Yes Historical Provider, MD  aspirin EC 81 MG tablet Take 81 mg by mouth daily.   Yes Historical Provider, MD  atorvastatin (LIPITOR) 40 MG tablet Take 40 mg by mouth at bedtime.    Yes Historical Provider, MD  brimonidine (ALPHAGAN) 0.2 % ophthalmic solution Place 1 drop into both eyes 3 (three) times daily.  11/12/14  Yes Historical Provider, MD  Calcium Carbonate-Vitamin D (CALCIUM 600+D) 600-400 MG-UNIT per tablet Take 1 tablet by mouth 2 (two) times daily.   Yes Historical Provider, MD  cholecalciferol (VITAMIN  D) 1000 UNITS tablet Take 1,000 Units by mouth daily.   Yes Historical Provider, MD  clopidogrel (PLAVIX) 75 MG tablet Take 75 mg by mouth daily.   Yes Historical Provider, MD  dorzolamide-timolol (COSOPT) 22.3-6.8 MG/ML ophthalmic solution Place 2 drops into both eyes Twice daily. 04/21/11  Yes Historical Provider, MD  meclizine (ANTIVERT) 50 MG tablet Take 1 tablet (50 mg total) by mouth 3 (three) times daily as needed. 01/17/15  Yes Rosalin Hawking, MD  polyvinyl alcohol (LIQUIFILM TEARS) 1.4 % ophthalmic solution Place 1 drop into both eyes as needed for dry eyes.    Yes Historical Provider, MD    prednisoLONE acetate (PRED FORTE) 1 % ophthalmic suspension Place 1 drop into both eyes daily.  04/12/11  Yes Historical Provider, MD  tamsulosin (FLOMAX) 0.4 MG CAPS capsule Take 0.4 mg by mouth at bedtime.   Yes Historical Provider, MD  metoCLOPramide (REGLAN) 10 MG tablet Take 1 tablet (10 mg total) by mouth every 8 (eight) hours as needed for nausea. Patient not taking: Reported on 12/24/2014 11/23/14   Rosalin Hawking, MD    Family History History reviewed. No pertinent family history.  Social History Social History  Substance Use Topics  . Smoking status: Never Smoker  . Smokeless tobacco: Never Used  . Alcohol use 1.2 oz/week    2 Shots of liquor per week     Comment: rare     Allergies   Review of patient's allergies indicates no known allergies.   Review of Systems Review of Systems 10/14 systems reviewed and are negative other than those stated in the HPI   Physical Exam Updated Vital Signs BP 169/73   Pulse (!) 51   Temp 98.1 F (36.7 C)   Resp 16   SpO2 99%   Physical Exam   ED Treatments / Results  Labs (all labs ordered are listed, but only abnormal results are displayed) Labs Reviewed  COMPREHENSIVE METABOLIC PANEL - Abnormal; Notable for the following:       Result Value   Glucose, Bld 103 (*)    Creatinine, Ser 1.41 (*)    Albumin 3.4 (*)    ALT 14 (*)    GFR calc non Af Amer 44 (*)    GFR calc Af Amer 51 (*)    All other components within normal limits  CBC  I-STAT TROPOININ, ED  CBG MONITORING, ED    EKG  EKG Interpretation  Date/Time:  Wednesday October 18 2015 18:35:12 EDT Ventricular Rate:  60 PR Interval:    QRS Duration: 136 QT Interval:  424 QTC Calculation: 424 R Axis:   -38 Text Interpretation:  Sinus rhythm with 1st degree A-V block with occasional Premature ventricular complexes Left axis deviation Right bundle branch block Moderate voltage criteria for LVH, may be normal variant Abnormal ECG similar to prior EKG  Confirmed by  Sky Borboa MD, Keeanna Villafranca [id number]) on 10/18/2015 8:03:26 PM       Radiology Dg Chest 2 View  Result Date: 10/18/2015 CLINICAL DATA:  82 year old male with chest discomfort and tightness for 3 weeks. Initial encounter. EXAM: CHEST  2 VIEW COMPARISON:  12/24/2014 and earlier. FINDINGS: Stable mild elevation of the left hemidiaphragm. Stable mild cardiomegaly. Other mediastinal contours are within normal limits. Visualized tracheal air column is within normal limits. No pneumothorax or pulmonary edema. No pleural effusion or confluent pulmonary opacity. No acute osseous abnormality identified. Do IMPRESSION: No acute cardiopulmonary abnormality. Electronically Signed   By: Herminio Heads.D.  On: 10/18/2015 20:41    Procedures Procedures (including critical care time)  Medications Ordered in ED Medications  aspirin chewable tablet 324 mg (324 mg Oral Given 10/18/15 2046)  gi cocktail (Maalox,Lidocaine,Donnatal) (30 mLs Oral Given 10/18/15 2046)     Initial Impression / Assessment and Plan / ED Course  I have reviewed the triage vital signs and the nursing notes.  Pertinent labs & imaging results that were available during my care of the patient were reviewed by me and considered in my medical decision making (see chart for details).  Clinical Course   82 year old male who presents with left-sided chest discomfort ongoing since this morning. He is in no acute distress with non-concerning vital signs. Exam overall unremarkable. EKG does not show evidence of acute ischemia or infarction. Troponin 1 is negative. Chest x-ray shows no acute cardiopulmonary processes. Given GI cocktail and full dose of aspirin, with gradual relief in his symptoms. He has high risk for ACS with overall heart score of 5. Low suspicion for dissection or PE as history is not suggestive of that. I discussed this patient with Dr. Myna Hidalgo who will admit for observation for serial enzymes and EKGs.  Final Clinical Impressions(s) / ED Diagnoses     Final diagnoses:  Nonspecific chest pain    New Prescriptions New Prescriptions   No medications on file     Forde Dandy, MD 10/18/15 2135

## 2015-10-18 NOTE — ED Triage Notes (Signed)
Per EMS- Pt here for evaluation of left chest and left arm "weird sensation." Does not report it as a pain but just a strange sensation. Pt seen by cardiologist last week, was told everything looked good. No IV acces/ No meds given prior to arrival. Pt does have history of a stroke with prior left sided deficits.

## 2015-10-18 NOTE — H&P (Signed)
History and Physical    Trevor Andrews [id number] DOB: June 04, 1929 DOA: 10/18/2015  PCP: Elizabeth Palau, MD (Inactive)   Patient coming from: Home   Chief Complaint: Chest discomfort   HPI: Trevor Andrews is a 82 y.o. male with medical history significant for coronary artery disease with stent, hypertension, diet controlled diabetes mellitus, chronic diastolic CHF, and history of ischemic stroke on aspirin and Plavix who presents to the emergency department for evaluation of chest discomfort. Patient reports pain in his usual state of health until developing some epigastric and lower left chest pain over the past couple weeks which has been intermittent, attributed to indigestion by the patient, and had been responding well to Maalox. This morning, the patient woke with similar sensation, but describes it as more severe and states that it did not respond to Maalox. He denies pain per se, but describes a "strange sensation" under the left breast and in the left arm. Sensation has persisted throughout the day today with no apparent alleviating or exacerbating factors. Patient believes this is secondary to some greasy food, but was concerned after do not resolve with Maalox and came in for evaluation. He reports strict adherence with his aspirin and Plavix, denies palpitations,dyspnea, cough, lower extremity edema, orthopnea.he has had no recent fevers or chills, no abdominal pain, no vomiting, and no diarrhea.  ED Course: Upon arrival to the ED, patient is found to be afebrile, saturating well on room air, bradycardic to 53, but with vitals otherwise stable. EKG demonstrates a sinus rhythm with first degree AV nodal block, left axis deviation, and a chronic right bundle branch block. Chest x-ray is negative for acute cardiopulmonary disease. CMP is notable for serum creatinine 1.41, up from 1.17 last year. CBC is entirely within the normal limits and troponin is undetectable. Patient was given a 324  mg aspirin and GI cocktail in the emergency department with resolution of his chest discomfort, and near-resolution of the arm symptoms. He has remained hemodynamically stable with no acute ischemic features noted on telemonitoring. He will be observed in the telemetry unit for ongoing evaluation and management of chest discomfort with history of CAD, concerning for possible ACS.   Review of Systems:  All other systems reviewed and apart from HPI, are negative.  Past Medical History:  Diagnosis Date  . Arthritis   . Atrial fibrillation ([place])   . Cancer [facility]) 1998   prostate  . Dementia   . Diabetes mellitus without complication ([place])   . Hypercholesteremia   . Hypertension   . Stroke ([place])   . Vertigo     Past Surgical History:  Procedure Laterality Date  . CATARACT EXTRACTION  2011 and 2012   2011lt eye and rt eye 2012  . CORONARY ANGIOPLASTY WITH STENT PLACEMENT    . glauma implant  2012   rt.eye  . PROSTATECTOMY       reports that he has never smoked. He has never used smokeless tobacco. He reports that he drinks about 1.2 oz of alcohol per week . He reports that he does not use drugs.  No Known Allergies  History reviewed. No pertinent family history.   Prior to Admission medications   Medication Sig Start Date End Date Taking? Authorizing Provider  acetaminophen (TYLENOL) 325 MG tablet Take 2 tablets (650 mg total) by mouth every 6 (six) hours as needed for mild pain (or Fever >/= 101). 10/20/14  Yes Kinnie Feil, MD  allopurinol (ZYLOPRIM) 100 MG tablet Take 200  mg by mouth daily.    Yes Historical Provider, MD  amLODipine (NORVASC) 10 MG tablet Take 10 mg by mouth daily.    Yes Historical Provider, MD  aspirin EC 81 MG tablet Take 81 mg by mouth daily.   Yes Historical Provider, MD  atorvastatin (LIPITOR) 40 MG tablet Take 40 mg by mouth at bedtime.    Yes Historical Provider, MD  brimonidine (ALPHAGAN) 0.2 % ophthalmic solution Place 1 drop into both eyes 3  (three) times daily.  11/12/14  Yes Historical Provider, MD  Calcium Carbonate-Vitamin D (CALCIUM 600+D) 600-400 MG-UNIT per tablet Take 1 tablet by mouth 2 (two) times daily.   Yes Historical Provider, MD  cholecalciferol (VITAMIN D) 1000 UNITS tablet Take 1,000 Units by mouth daily.   Yes Historical Provider, MD  clopidogrel (PLAVIX) 75 MG tablet Take 75 mg by mouth daily.   Yes Historical Provider, MD  dorzolamide-timolol (COSOPT) 22.3-6.8 MG/ML ophthalmic solution Place 2 drops into both eyes Twice daily. 04/21/11  Yes Historical Provider, MD  meclizine (ANTIVERT) 50 MG tablet Take 1 tablet (50 mg total) by mouth 3 (three) times daily as needed. 01/17/15  Yes Rosalin Hawking, MD  polyvinyl alcohol (LIQUIFILM TEARS) 1.4 % ophthalmic solution Place 1 drop into both eyes as needed for dry eyes.    Yes Historical Provider, MD  prednisoLONE acetate (PRED FORTE) 1 % ophthalmic suspension Place 1 drop into both eyes daily.  04/12/11  Yes Historical Provider, MD  tamsulosin (FLOMAX) 0.4 MG CAPS capsule Take 0.4 mg by mouth at bedtime.   Yes Historical Provider, MD  metoCLOPramide (REGLAN) 10 MG tablet Take 1 tablet (10 mg total) by mouth every 8 (eight) hours as needed for nausea. Patient not taking: Reported on 12/24/2014 11/23/14   Rosalin Hawking, MD    Physical Exam: Vitals:   10/18/15 1845 10/18/15 1930 10/18/15 2000 10/18/15 2100  BP: 118/66 135/75 145/75 169/73  Pulse: 61 (!) 56 (!) 53 (!) 51  Resp: 18 (!) 0 15 16  Temp: 98.1 F (36.7 C)     SpO2: 96% 96% 99% 99%      Constitutional: NAD, calm, comfortable, chronically-ill in appearance  Eyes: PERTLA, lids and conjunctivae normal ENMT: Mucous membranes are moist. Posterior pharynx clear of any exudate or lesions.   Neck: normal, supple, no masses, no thyromegaly Respiratory: clear to auscultation bilaterally, no wheezing, no crackles. Normal respiratory effort.   Cardiovascular: S1 & S2 heard, regular rate and rhythm, no significant murmurs. No  extremity edema. No significant JVD. Abdomen: No distension, no tenderness, no masses palpated. Bowel sounds normal.  Musculoskeletal: no clubbing / cyanosis. No joint deformity upper and lower extremities. Normal muscle tone.  Skin: Superficial excoriations and lichenified papules throughout LE's. Warm, dry, well-perfused. Neurologic: CN 2-12 grossly intact. Sensation intact, DTR normal.    Psychiatric: Normal judgment and insight. Alert and oriented x 3. Normal mood and affect.     Labs on Admission: I have personally reviewed following labs and imaging studies  CBC:  Recent Labs Lab 10/18/15 1924  WBC 5.2  HGB 13.0  HCT 39.1  MCV 89.3  PLT 123456   Basic Metabolic Panel:  Recent Labs Lab 10/18/15 1924  NA 138  K 3.7  CL 105  CO2 26  GLUCOSE 103*  BUN 20  CREATININE 1.41*  CALCIUM 9.5   GFR: CrCl cannot be calculated (Unknown ideal weight.). Liver Function Tests:  Recent Labs Lab 10/18/15 1924  AST 16  ALT 14*  ALKPHOS 53  BILITOT 0.5  PROT 6.9  ALBUMIN 3.4*   No results for input(s): LIPASE, AMYLASE in the last 168 hours. No results for input(s): AMMONIA in the last 168 hours. Coagulation Profile: No results for input(s): INR, PROTIME in the last 168 hours. Cardiac Enzymes: No results for input(s): CKTOTAL, CKMB, CKMBINDEX, TROPONINI in the last 168 hours. BNP (last 3 results) No results for input(s): PROBNP in the last 8760 hours. HbA1C: No results for input(s): HGBA1C in the last 72 hours. CBG:  Recent Labs Lab 10/18/15 1923  GLUCAP 92   Lipid Profile: No results for input(s): CHOL, HDL, LDLCALC, TRIG, CHOLHDL, LDLDIRECT in the last 72 hours. Thyroid Function Tests: No results for input(s): TSH, T4TOTAL, FREET4, T3FREE, THYROIDAB in the last 72 hours. Anemia Panel: No results for input(s): VITAMINB12, FOLATE, FERRITIN, TIBC, IRON, RETICCTPCT in the last 72 hours. Urine analysis:    Component Value Date/Time   COLORURINE YELLOW 11/18/2014  0147   APPEARANCEUR CLEAR 11/18/2014 0147   LABSPEC 1.018 11/18/2014 0147   PHURINE 5.5 11/18/2014 0147   GLUCOSEU NEGATIVE 11/18/2014 0147   HGBUR NEGATIVE 11/18/2014 0147   BILIRUBINUR NEGATIVE 11/18/2014 0147   KETONESUR NEGATIVE 11/18/2014 0147   PROTEINUR NEGATIVE 11/18/2014 0147   UROBILINOGEN 0.2 11/18/2014 0147   NITRITE NEGATIVE 11/18/2014 0147   LEUKOCYTESUR SMALL (A) 11/18/2014 0147   Sepsis Labs: @LABRCNTIP (procalcitonin:4,lacticidven:4) )No results found for this or any previous visit (from the past 240 hour(s)).   Radiological Exams on Admission: Dg Chest 2 View  Result Date: 10/18/2015 CLINICAL DATA:  82 year old male with chest discomfort and tightness for 3 weeks. Initial encounter. EXAM: CHEST  2 VIEW COMPARISON:  12/24/2014 and earlier. FINDINGS: Stable mild elevation of the left hemidiaphragm. Stable mild cardiomegaly. Other mediastinal contours are within normal limits. Visualized tracheal air column is within normal limits. No pneumothorax or pulmonary edema. No pleural effusion or confluent pulmonary opacity. No acute osseous abnormality identified. Do IMPRESSION: No acute cardiopulmonary abnormality. Electronically Signed   By: Genevie Ann M.D.   On: 10/18/2015 20:41    EKG: Independently reviewed. Sinus, 1st degree AV block, LAD, RBBB, no significant change from prior  Assessment/Plan  1. Chest pain, hx of CAD  - Atypical pain, likely GI, but pt high-risk and pain did not respond to Maalox at home as it usually does  - Hx of CAD and reports seeing cardiology last week, though notes not in [facility]  - Had normal nuc med scan in 2006   - Takes ASA 81, Plavix, Lipitor at home - Initial EKG with no significant change from priors; initial troponin 0.00  - ASA 324 mg given in ED - Monitor on telemetry for ischemic changes, obtain serial troponin measurements, and repeat EKG in am  - Continue ASA 81, Plavix, Lipitor  2. GERD  - Managed with prn Maalox at home - Suspect  this may be the source of his chest pain   - Trial Pepcid 20 mg IV q12h and GI cocktail q8h prn indigestion or chest discomfort  - No EGD on file    3. CKD stage III  - SCr 1.41 on admission, up from apparent baseline of ~1.2  - Avoid nephrotoxins, renally-dose medications as applicable  - Providing a gentle IVF hydration overnight - Repeat chem panel in am   4. Chronic diastolic CHF  - Appears dry on admission and is receiving a gentle IVF hydration  - TTE (10/18/14) with EF 55%, mild LVH, and grade 1 diastolic dysfunction  -  Not on beta-blocker, but is bradycardic here, so will leave off  - Not on ACE/ARB, but will not start now with small bump in his SCr  - Follow daily wts and I/O's    5. Hx of ischemic CVA  - Has residual left-sided deficits that are stable  - Continue ASA 81, Plavix, Lipitor    DVT prophylaxis: sq Lovenox Code Status: Full  Family Communication: Son updated at bedside  Disposition Plan: Observe on telemetry  Consults called: None Admission status: Observation    Vianne Bulls, MD [facility] Pager [phone]  If 7PM-7AM, please contact night-coverage [email or web address] Password [facility]  10/18/2015, 10:24 PM

## 2015-10-19 ENCOUNTER — Encounter (HOSPITAL_COMMUNITY): Payer: Self-pay | Admitting: Physician Assistant

## 2015-10-19 DIAGNOSIS — E7800 Pure hypercholesterolemia, unspecified: Secondary | ICD-10-CM | POA: Diagnosis not present

## 2015-10-19 DIAGNOSIS — R079 Chest pain, unspecified: Secondary | ICD-10-CM | POA: Diagnosis not present

## 2015-10-19 DIAGNOSIS — E1159 Type 2 diabetes mellitus with other circulatory complications: Secondary | ICD-10-CM | POA: Diagnosis not present

## 2015-10-19 DIAGNOSIS — I5032 Chronic diastolic (congestive) heart failure: Secondary | ICD-10-CM | POA: Diagnosis not present

## 2015-10-19 DIAGNOSIS — R072 Precordial pain: Secondary | ICD-10-CM | POA: Diagnosis not present

## 2015-10-19 DIAGNOSIS — N183 Chronic kidney disease, stage 3 (moderate): Secondary | ICD-10-CM | POA: Diagnosis not present

## 2015-10-19 DIAGNOSIS — I25709 Atherosclerosis of coronary artery bypass graft(s), unspecified, with unspecified angina pectoris: Secondary | ICD-10-CM | POA: Diagnosis not present

## 2015-10-19 DIAGNOSIS — I452 Bifascicular block: Secondary | ICD-10-CM

## 2015-10-19 DIAGNOSIS — I10 Essential (primary) hypertension: Secondary | ICD-10-CM

## 2015-10-19 DIAGNOSIS — R0789 Other chest pain: Secondary | ICD-10-CM | POA: Diagnosis not present

## 2015-10-19 DIAGNOSIS — I130 Hypertensive heart and chronic kidney disease with heart failure and stage 1 through stage 4 chronic kidney disease, or unspecified chronic kidney disease: Secondary | ICD-10-CM | POA: Diagnosis not present

## 2015-10-19 LAB — BASIC METABOLIC PANEL
ANION GAP: 6 (ref 5–15)
BUN: 17 mg/dL (ref 6–20)
CALCIUM: 9.1 mg/dL (ref 8.9–10.3)
CO2: 27 mmol/L (ref 22–32)
Chloride: 106 mmol/L (ref 101–111)
Creatinine, Ser: 1.25 mg/dL — ABNORMAL HIGH (ref 0.61–1.24)
GFR calc Af Amer: 59 mL/min — ABNORMAL LOW (ref >60)
GFR, EST NON AFRICAN AMERICAN: 51 mL/min — AB (ref >60)
GLUCOSE: 98 mg/dL (ref 65–99)
Potassium: 3.6 mmol/L (ref 3.5–5.1)
Sodium: 139 mmol/L (ref 135–145)

## 2015-10-19 LAB — GLUCOSE, CAPILLARY
Glucose-Capillary: 99 mg/dL (ref 65–99)
Glucose-Capillary: 138 mg/dL — ABNORMAL HIGH (ref 65–99)

## 2015-10-19 LAB — TROPONIN I

## 2015-10-19 MED ORDER — PANTOPRAZOLE SODIUM 40 MG PO TBEC: 40.0000 mg | DELAYED_RELEASE_TABLET | Freq: Every day | ORAL | 1 refills | Status: DC

## 2015-10-19 NOTE — Consult Note (Signed)
Cardiology Consultation Note    Patient ID: Trevor Andrews, MRN: [id number], DOB/AGE: Nov 15, 1929 82 y.o. Admit date: 10/18/2015   Date of Consult: 10/19/2015 Primary Physician: Elizabeth Palau, MD (Inactive) Primary Cardiologist: [facility]  Chief Complaint: chest pain Reason for Consultation: chest pain Requesting MD: Dr. Myna Hidalgo  HPI: Trevor Andrews is a 82 y.o. male with history of CAD s/p stent ~1 year ago at the [place] (vessel unknown), HTN, DM, stroke (on ASA/Plavix - felt 2/2 small vessel disease source), RBBB, prostate CA, mild dementia, HLD, vertigo, legally blind, CKD II per review of labs whom we are asked to see for chest pain. He reports his last cath was at time of PCI 1 year ago - symptoms preceding this were significant dyspnea. He says he wore an event monitor a month ago and is waiting on the results from their office - was done for dizziness/vertigo which he's had since his stroke. He did not receive any stat notifications about it while wearing the monitor.   Yesterday while resting he developed some left sided/axillary/epigastric discomfort reminiscent of his usual acid reflux symptoms. Usually this is relieved with Maalox, ginger ale ale and Pepto bismol. This did not help this time.  The discomfort persisted on/off for several hours. He had possible mild dyspnea. It was not worse with exertion, inspiration or palpation. He lost his wife last year and attended his brother's funeral yesterday so he wanted to make sure the discomfort wasn't something else. He reports resolution of sx after being given a GI cocktail. Labs notable for Cr 1.4-1.25, trop neg x 3, BNP 26. He denies any nausea, vomiting, bleeding with the above episode. He currently feels back to himself. He has continued the exercise program he used to do at cardiac rehab daily - squats, walking in place, knee lifts - every day without any recent anginal symptoms. Telemetry: NSR, SB (upper 40s) occ PVCs.   Past Medical  History:  Diagnosis Date  . Arthritis   . Cancer [facility]) 1998   prostate  . CKD (chronic kidney disease), stage II   . Dementia   . Diabetes mellitus ([place])   . GERD (gastroesophageal reflux disease)   . Hypercholesteremia   . Hypertension   . Stroke ([place])   . Vertigo       Surgical History:  Past Surgical History:  Procedure Laterality Date  . CATARACT EXTRACTION  2011 and 2012   2011lt eye and rt eye 2012  . CORONARY ANGIOPLASTY WITH STENT PLACEMENT    . glauma implant  2012   rt.eye  . PROSTATECTOMY       Home Meds: Prior to Admission medications   Medication Sig Start Date End Date Taking? Authorizing Provider  acetaminophen (TYLENOL) 325 MG tablet Take 2 tablets (650 mg total) by mouth every 6 (six) hours as needed for mild pain (or Fever >/= 101). 10/20/14  Yes Kinnie Feil, MD  allopurinol (ZYLOPRIM) 100 MG tablet Take 200 mg by mouth daily.    Yes Historical Provider, MD  amLODipine (NORVASC) 10 MG tablet Take 10 mg by mouth daily.    Yes Historical Provider, MD  aspirin EC 81 MG tablet Take 81 mg by mouth daily.   Yes Historical Provider, MD  atorvastatin (LIPITOR) 40 MG tablet Take 40 mg by mouth at bedtime.    Yes Historical Provider, MD  brimonidine (ALPHAGAN) 0.2 % ophthalmic solution Place 1 drop into both eyes 3 (three) times daily.  11/12/14  Yes  Historical Provider, MD  Calcium Carbonate-Vitamin D (CALCIUM 600+D) 600-400 MG-UNIT per tablet Take 1 tablet by mouth 2 (two) times daily.   Yes Historical Provider, MD  cholecalciferol (VITAMIN D) 1000 UNITS tablet Take 1,000 Units by mouth daily.   Yes Historical Provider, MD  clopidogrel (PLAVIX) 75 MG tablet Take 75 mg by mouth daily.   Yes Historical Provider, MD  dorzolamide-timolol (COSOPT) 22.3-6.8 MG/ML ophthalmic solution Place 2 drops into both eyes Twice daily. 04/21/11  Yes Historical Provider, MD  meclizine (ANTIVERT) 50 MG tablet Take 1 tablet (50 mg total) by mouth 3 (three) times daily as needed.  01/17/15  Yes Rosalin Hawking, MD  polyvinyl alcohol (LIQUIFILM TEARS) 1.4 % ophthalmic solution Place 1 drop into both eyes as needed for dry eyes.    Yes Historical Provider, MD  prednisoLONE acetate (PRED FORTE) 1 % ophthalmic suspension Place 1 drop into both eyes daily.  04/12/11  Yes Historical Provider, MD  tamsulosin (FLOMAX) 0.4 MG CAPS capsule Take 0.4 mg by mouth at bedtime.   Yes Historical Provider, MD  metoCLOPramide (REGLAN) 10 MG tablet Take 1 tablet (10 mg total) by mouth every 8 (eight) hours as needed for nausea. Patient not taking: Reported on 12/24/2014 11/23/14   Rosalin Hawking, MD    Inpatient Medications:  . allopurinol  200 mg Oral Daily  . amLODipine  10 mg Oral Daily  . aspirin EC  81 mg Oral Daily  . atorvastatin  40 mg Oral QHS  . brimonidine  1 drop Both Eyes TID  . calcium-vitamin D  1 tablet Oral BID WC  . clopidogrel  75 mg Oral Daily  . dorzolamide-timolol  1 drop Both Eyes BID  . enoxaparin (LOVENOX) injection  40 mg Subcutaneous Daily  . famotidine (PEPCID) IV  20 mg Intravenous Q12H  . prednisoLONE acetate  1 drop Both Eyes Daily  . tamsulosin  0.4 mg Oral QHS   . sodium chloride 50 mL/hr at 10/19/15 0900    Allergies: No Known Allergies  Social History   Social History  . Marital status: Married    Spouse name: N/A  . Number of children: N/A  . Years of education: N/A   Occupational History  . Not on file.   Social History Main Topics  . Smoking status: Never Smoker  . Smokeless tobacco: Never Used  . Alcohol use 1.2 oz/week    2 Shots of liquor per week     Comment: rare  . Drug use: No  . Sexual activity: Not on file   Other Topics Concern  . Not on file   Social History Narrative  . No narrative on file     Family History  Problem Relation Age of Onset  . Premature CHD Neg Hx      Review of Systems: All other systems reviewed and are otherwise negative except as noted above.  Labs:  Recent Labs  10/18/15 2246  10/19/15 0400  TROPONINI <0.03 <0.03   Lab Results  Component Value Date   WBC 5.2 10/18/2015   HGB 13.0 10/18/2015   HCT 39.1 10/18/2015   MCV 89.3 10/18/2015   PLT 201 10/18/2015    Recent Labs Lab 10/18/15 1924 10/19/15 0400  NA 138 139  K 3.7 3.6  CL 105 106  CO2 26 27  BUN 20 17  CREATININE 1.41* 1.25*  CALCIUM 9.5 9.1  PROT 6.9  --   BILITOT 0.5  --   ALKPHOS 53  --  ALT 14*  --   AST 16  --   GLUCOSE 103* 98   Lab Results  Component Value Date   CHOL 129 10/19/2014   HDL 37 (L) 10/19/2014   LDLCALC 65 10/19/2014   TRIG 133 10/19/2014   Lab Results  Component Value Date   DDIMER 1.01 (H) 09/03/2013    Radiology/Studies:  Dg Chest 2 View  Result Date: 10/18/2015 CLINICAL DATA:  82 year old male with chest discomfort and tightness for 3 weeks. Initial encounter. EXAM: CHEST  2 VIEW COMPARISON:  12/24/2014 and earlier. FINDINGS: Stable mild elevation of the left hemidiaphragm. Stable mild cardiomegaly. Other mediastinal contours are within normal limits. Visualized tracheal air column is within normal limits. No pneumothorax or pulmonary edema. No pleural effusion or confluent pulmonary opacity. No acute osseous abnormality identified. Do IMPRESSION: No acute cardiopulmonary abnormality. Electronically Signed   By: Genevie Ann M.D.   On: 10/18/2015 20:41    Wt Readings from Last 3 Encounters:  10/19/15 218 lb (98.9 kg)  11/23/14 223 lb (101.2 kg)  10/19/14 217 lb 12.8 oz (98.8 kg)    EKG: SB with RBBB, first degree AVB, nonspecific ST_T changes  Physical Exam: Blood pressure 121/76, pulse (!) 51, temperature 98 F (36.7 C), temperature source Oral, resp. rate 16, height 6\' 2"  (1.88 m), weight 218 lb (98.9 kg), SpO2 97 %. Body mass index is 27.99 kg/m. General: Well developed, well nourished AAM, in no acute distress. Head: Normocephalic, atraumatic, sclera non-icteric, no xanthomas, nares are without discharge.  Neck: Negative for carotid bruits. JVD not  elevated. Lungs: Clear bilaterally to auscultation without wheezes, rales, or rhonchi. Breathing is unlabored. Heart: RRR with S1 S2. No murmurs, rubs, or gallops appreciated. Abdomen: Soft, non-tender, non-distended with normoactive bowel sounds. No hepatomegaly. No rebound/guarding. No obvious abdominal masses. Msk:  Strength and tone appear normal for age. Extremities: No clubbing or cyanosis. No edema.  Distal pedal pulses are 2+ and equal bilaterally. Neuro: Alert and oriented X 3. No facial asymmetry. No focal deficit. Moves all extremities spontaneously. Psych:  Responds to questions appropriately with a normal affect.     Assessment and Plan  60M with CAD s/p stent ~1 year ago at the [place] (vessel unknown), GERD, HTN, DM, stroke (on ASA/Plavix - felt 2/2 small vessel disease source), RBBB, prostate CA, mild dementia, HLD, vertigo, legally blind, CKD II presented with chest pain reminiscent of prior acid reflux.  1. Chest pain - generally atypical, persistent on/off for several hours without evidence for ischemia by enzymes. He exercises daily without any exertional angina. Will review need for further workup with Dr. Radford Pax. Can consider ambulating pt this AM and if he does well, possibly send home with close OP f/u with his primary cardiologist to consider updating stress test.  2. CAD - continue ASA, Plavix, amlodipine, statin. Not on BB due to baseline HR 50s.   3. Bifascicular block with RBBB, 1st degree AVB - no sustained significant bradycardia. Avoid AVN blocking agents.  Donzetta Starch PA-C 10/19/2015, 9:24 AM Pager: [phone]

## 2015-10-19 NOTE — Progress Notes (Signed)
Discharge teaching and instructions reviewed. Vss. Pt has no further questions. Discharging home via daughter.

## 2015-10-20 NOTE — Discharge Summary (Signed)
Physician Discharge Summary  Trevor Andrews MRN: [id number] DOB/AGE: 1929-05-17 82 y.o.  PCP: Elizabeth Palau, MD (Inactive)   Admit date: 10/18/2015 Discharge date: 10/20/2015  Discharge Diagnoses:    Active Problems:   Hypertension   Hypercholesteremia   History of coronary artery stent placement   History of arterial ischemic stroke   Type 2 diabetes mellitus with other circulatory complications (HCC)   CKD (chronic kidney disease), stage III   Chronic diastolic CHF (congestive heart failure) (HCC)   Nonspecific chest pain   Chest pain at rest   Bifascicular block    Follow-up recommendations Follow-up with PCP in 3-5 days , including all  additional recommended appointments as below Follow-up CBC, CMP in 3-5 days patient advised to followup with his Cardiologist at the [facility]      Discharge Medication List as of 10/19/2015 11:25 AM    START taking these medications   Details  pantoprazole (PROTONIX) 40 MG tablet Take 1 tablet (40 mg total) by mouth daily., Starting Thu 10/19/2015, Normal      CONTINUE these medications which have NOT CHANGED   Details  acetaminophen (TYLENOL) 325 MG tablet Take 2 tablets (650 mg total) by mouth every 6 (six) hours as needed for mild pain (or Fever >/= 101)., Starting Thu 10/20/2014, OTC    allopurinol (ZYLOPRIM) 100 MG tablet Take 200 mg by mouth daily. , Historical Med    amLODipine (NORVASC) 10 MG tablet Take 10 mg by mouth daily. , Historical Med    aspirin EC 81 MG tablet Take 81 mg by mouth daily., Historical Med    atorvastatin (LIPITOR) 40 MG tablet Take 40 mg by mouth at bedtime. , Historical Med    brimonidine (ALPHAGAN) 0.2 % ophthalmic solution Place 1 drop into both eyes 3 (three) times daily. , Starting Sat 11/12/2014, Historical Med    Calcium Carbonate-Vitamin D (CALCIUM 600+D) 600-400 MG-UNIT per tablet Take 1 tablet by mouth 2 (two) times daily., Historical Med    cholecalciferol (VITAMIN D) 1000 UNITS tablet  Take 1,000 Units by mouth daily., Historical Med    clopidogrel (PLAVIX) 75 MG tablet Take 75 mg by mouth daily., Historical Med    dorzolamide-timolol (COSOPT) 22.3-6.8 MG/ML ophthalmic solution Place 2 drops into both eyes Twice daily., Starting Sun 04/21/2011, Historical Med    meclizine (ANTIVERT) 50 MG tablet Take 1 tablet (50 mg total) by mouth 3 (three) times daily as needed., Starting Tue 01/17/2015, Normal    polyvinyl alcohol (LIQUIFILM TEARS) 1.4 % ophthalmic solution Place 1 drop into both eyes as needed for dry eyes. , Historical Med    prednisoLONE acetate (PRED FORTE) 1 % ophthalmic suspension Place 1 drop into both eyes daily. , Starting Fri 04/12/2011, Historical Med    tamsulosin (FLOMAX) 0.4 MG CAPS capsule Take 0.4 mg by mouth at bedtime., Historical Med    metoCLOPramide (REGLAN) 10 MG tablet Take 1 tablet (10 mg total) by mouth every 8 (eight) hours as needed for nausea., Starting Wed 11/23/2014, Normal         Discharge Condition:    Discharge Instructions Get Medicines reviewed and adjusted: Please take all your medications with you for your next visit with your Primary MD  Please request your Primary MD to go over all hospital tests and procedure/radiological results at the follow up, please ask your Primary MD to get all Hospital records sent to his/her office.  If you experience worsening of your admission symptoms, develop shortness of breath, life threatening emergency,  suicidal or homicidal thoughts you must seek medical attention immediately by calling 911 or calling your MD immediately if symptoms less severe.  You must read complete instructions/literature along with all the possible adverse reactions/side effects for all the Medicines you take and that have been prescribed to you. Take any new Medicines after you have completely understood and accpet all the possible adverse reactions/side effects.   Do not drive when taking Pain medications.   Do not  take more than prescribed Pain, Sleep and Anxiety Medications  Special Instructions: If you have smoked or chewed Tobacco in the last 2 yrs please stop smoking, stop any regular Alcohol and or any Recreational drug use.  Wear Seat belts while driving.  Please note  You were cared for by a hospitalist during your hospital stay. Once you are discharged, your primary care physician will handle any further medical issues. Please note that NO REFILLS for any discharge medications will be authorized once you are discharged, as it is imperative that you return to your primary care physician (or establish a relationship with a primary care physician if you do not have one) for your aftercare needs so that they can reassess your need for medications and monitor your lab values.  Discharge Instructions    Diet - low sodium heart healthy    Complete by:  As directed   Increase activity slowly    Complete by:  As directed       No Known Allergies    Disposition: 01-Home or Self Care   Consults:  cardiology    Significant Diagnostic Studies:  Dg Chest 2 View  Result Date: 10/18/2015 CLINICAL DATA:  82 year old male with chest discomfort and tightness for 3 weeks. Initial encounter. EXAM: CHEST  2 VIEW COMPARISON:  12/24/2014 and earlier. FINDINGS: Stable mild elevation of the left hemidiaphragm. Stable mild cardiomegaly. Other mediastinal contours are within normal limits. Visualized tracheal air column is within normal limits. No pneumothorax or pulmonary edema. No pleural effusion or confluent pulmonary opacity. No acute osseous abnormality identified. Do IMPRESSION: No acute cardiopulmonary abnormality. Electronically Signed   By: Genevie Ann M.D.   On: 10/18/2015 20:41           Filed Weights   10/18/15 2333 10/19/15 0525  Weight: 98.9 kg (218 lb 1.6 oz) 98.9 kg (218 lb)     Microbiology: No results found for this or any previous visit (from the past 240 hour(s)).     Blood  Culture    Component Value Date/Time   SDES URINE, CLEAN CATCH 10/14/2014 0903   SPECREQUEST NONE 10/14/2014 0903   CULT  10/14/2014 0903    7,000 COLONIES/mL INSIGNIFICANT GROWTH Performed at [place] 10/15/2014 FINAL 10/14/2014 0903      Labs: Results for orders placed or performed during the hospital encounter of 10/18/15 (from the past 48 hour(s))  POC CBG, ED     Status: None   Collection Time: 10/18/15  7:23 PM  Result Value Ref Range   Glucose-Capillary 92 65 - 99 mg/dL  Comprehensive metabolic panel     Status: Abnormal   Collection Time: 10/18/15  7:24 PM  Result Value Ref Range   Sodium 138 135 - 145 mmol/L   Potassium 3.7 3.5 - 5.1 mmol/L   Chloride 105 101 - 111 mmol/L   CO2 26 22 - 32 mmol/L   Glucose, Bld 103 (H) 65 - 99 mg/dL   BUN 20 6 - 20 mg/dL  Creatinine, Ser 1.41 (H) 0.61 - 1.24 mg/dL   Calcium 9.5 8.9 - 10.3 mg/dL   Total Protein 6.9 6.5 - 8.1 g/dL   Albumin 3.4 (L) 3.5 - 5.0 g/dL   AST 16 15 - 41 U/L   ALT 14 (L) 17 - 63 U/L   Alkaline Phosphatase 53 38 - 126 U/L   Total Bilirubin 0.5 0.3 - 1.2 mg/dL   GFR calc non Af Amer 44 (L) >60 mL/min   GFR calc Af Amer 51 (L) >60 mL/min    Comment: (NOTE) The eGFR has been calculated using the CKD EPI equation. This calculation has not been validated in all clinical situations. eGFR's persistently <60 mL/min signify possible Chronic Kidney Disease.    Anion gap 7 5 - 15  CBC     Status: None   Collection Time: 10/18/15  7:24 PM  Result Value Ref Range   WBC 5.2 4.0 - 10.5 K/uL   RBC 4.38 4.22 - 5.81 MIL/uL   Hemoglobin 13.0 13.0 - 17.0 g/dL   HCT 39.1 39.0 - 52.0 %   MCV 89.3 78.0 - 100.0 fL   MCH 29.7 26.0 - 34.0 pg   MCHC 33.2 30.0 - 36.0 g/dL   RDW 14.5 11.5 - 15.5 %   Platelets 201 150 - 400 K/uL  Brain natriuretic peptide     Status: None   Collection Time: 10/18/15  7:24 PM  Result Value Ref Range   B Natriuretic Peptide 26.3 0.0 - 100.0 pg/mL  I-stat troponin,  ED     Status: None   Collection Time: 10/18/15  7:32 PM  Result Value Ref Range   Troponin i, poc 0.00 0.00 - 0.08 ng/mL   Comment 3            Comment: Due to the release kinetics of cTnI, a negative result within the first hours of the onset of symptoms does not rule out myocardial infarction with certainty. If myocardial infarction is still suspected, repeat the test at appropriate intervals.   Troponin I     Status: None   Collection Time: 10/18/15 10:46 PM  Result Value Ref Range   Troponin I <0.03 <0.03 ng/mL  TSH     Status: None   Collection Time: 10/18/15 10:47 PM  Result Value Ref Range   TSH 4.074 0.350 - 4.500 uIU/mL  Troponin I     Status: None   Collection Time: 10/19/15  4:00 AM  Result Value Ref Range   Troponin I <0.03 <0.03 ng/mL  Basic metabolic panel     Status: Abnormal   Collection Time: 10/19/15  4:00 AM  Result Value Ref Range   Sodium 139 135 - 145 mmol/L   Potassium 3.6 3.5 - 5.1 mmol/L   Chloride 106 101 - 111 mmol/L   CO2 27 22 - 32 mmol/L   Glucose, Bld 98 65 - 99 mg/dL   BUN 17 6 - 20 mg/dL   Creatinine, Ser 1.25 (H) 0.61 - 1.24 mg/dL   Calcium 9.1 8.9 - 10.3 mg/dL   GFR calc non Af Amer 51 (L) >60 mL/min   GFR calc Af Amer 59 (L) >60 mL/min    Comment: (NOTE) The eGFR has been calculated using the CKD EPI equation. This calculation has not been validated in all clinical situations. eGFR's persistently <60 mL/min signify possible Chronic Kidney Disease.    Anion gap 6 5 - 15  Glucose, capillary     Status: None   Collection  Time: 10/19/15  7:19 AM  Result Value Ref Range   Glucose-Capillary 99 65 - 99 mg/dL  Troponin I     Status: None   Collection Time: 10/19/15 10:05 AM  Result Value Ref Range   Troponin I <0.03 <0.03 ng/mL  Glucose, capillary     Status: Abnormal   Collection Time: 10/19/15 11:28 AM  Result Value Ref Range   Glucose-Capillary 138 (H) 65 - 99 mg/dL   Comment 1 Notify RN    Comment 2 Document in Chart       Lipid Panel     Component Value Date/Time   CHOL 129 10/19/2014 0300   TRIG 133 10/19/2014 0300   HDL 37 (L) 10/19/2014 0300   CHOLHDL 3.5 10/19/2014 0300   VLDL 27 10/19/2014 0300   LDLCALC 65 10/19/2014 0300     Lab Results  Component Value Date   HGBA1C 6.6 (H) 10/18/2014     Lab Results  Component Value Date   LDLCALC 65 10/19/2014   CREATININE 1.25 (H) 10/19/2015     HPI :  IMRAN NUON is a 82 y.o. male with history of CAD s/p stent ~1 year ago at the [place] (vessel unknown), HTN, DM, stroke (on ASA/Plavix - felt 2/2 small vessel disease source), RBBB, prostate CA, mild dementia, HLD, vertigo, legally blind, CKD II presents with chest pain. He reports his last cath was at time of PCI 1 year ago - symptoms preceding this were significant dyspnea. He says he wore an event monitor a month ago and is waiting on the results from their office - was done for dizziness/vertigo which he's had since his stroke. He did not receive any stat notifications about it while wearing the monitor.   Yesterday while resting he developed some left sided/axillary/epigastric discomfort reminiscent of his usual acid reflux symptoms. Usually this is relieved with Maalox, ginger ale ale and Pepto bismol. This did not help this time.  The discomfort persisted on/off for several hours. He had possible mild dyspnea. It was not worse with exertion, inspiration or palpation. He lost his wife last year and attended his brother's funeral yesterday so he wanted to make sure the discomfort wasn't something else. He reports resolution of sx after being given a GI cocktail. Labs notable for Cr 1.4-1.25, trop neg x 3, BNP 26.  Marland Kitchen He has continued the exercise program he used to do at cardiac rehab daily - squats, walking in place, knee lifts - every day without any recent anginal symptoms. Telemetry: NSR, SB (upper 40s) occ PVCs  HOSPITAL COURSE:   1. Chest pain - generally atypical, persistent on/off for several  hours without evidence for ischemia by enzymes. He exercises daily without any exertional angina. Patient with history of CAD s/p PCI a year ago of unknown vessel, attended a funeral for his brother yesterday and had chest pain that was typical of is GERD but unresponsive to maalox, ginger ale or pepto bismol.  His anginal equivalent is DOE.  He came to the ER where his EKG was nonischemic with chronic RBBB unchanged from prior EKG and GI cocktail completely relieved his pain.  Trop is neg x 3.  He has been exercising at home with walking without any anginal symptoms.  His CP is most consistent with GI etiology given that is was completely relieved with GI cocktail.  No further cardiac workup at this time was recommended by cardiology . Patient advised to followup with his Cardiologist at the [place].  2. CAD - continue ASA, Plavix, amlodipine, statin. Not on BB due to baseline HR 50s.   3. Bifascicular block with RBBB, 1st degree AVB - no sustained significant bradycardia. Avoid AVN blocking agents1   Discharge Exam:  * Blood pressure 118/62, pulse (!) 49, temperature 98 F (36.7 C), temperature source Oral, resp. rate 16, height _0  (1.88 m), weight 98.9 kg (218 lb), SpO2 99 %.     General: Well developed, well nourished AAM, in no acute distress. Head: Normocephalic, atraumatic, sclera non-icteric, no xanthomas, nares are without discharge.                    Neck: Negative for carotid bruits. JVD not elevated. Lungs: Clear bilaterally to auscultation without wheezes, rales, or rhonchi. Breathing is unlabored. Heart: RRR with S1 S2. No murmurs, rubs, or gallops appreciated. Abdomen: Soft, non-tender, non-distended with normoactive bowel sounds. No hepatomegaly. No rebound/guarding. No obvious abdominal masses. Msk:  Strength and tone appear normal for age. Extremities: No clubbing or cyanosis. No edema.  Distal pedal pulses are 2+ and equal bilaterally.   SignedReyne Dumas 10/20/2015,  7:16 PM        Time spent >45 mins

## 2015-12-21 NOTE — ED Provider Notes (Signed)
Physical exam for 10/18/2015 ED encounter: Physical Exam  Nursing note and vitals reviewed. Constitutional:  non-toxic, and in no acute distress Head: Normocephalic and atraumatic.  Mouth/Throat: Oropharynx is clear and moist.  Neck: Normal range of motion. Neck supple.  Cardiovascular: Bradycardic rate and regular rhythm.   Pulmonary/Chest: Effort normal and breath sounds normal.  Abdominal: Soft. There is no tenderness. There is no rebound and no guarding.  Musculoskeletal: Normal range of motion.  Neurological: Alert, no facial droop, fluent speech, moves all extremities symmetrically Skin: Skin is warm and dry.  Psychiatric: Cooperative    Forde Dandy, MD 12/21/15 [phone]

## 2016-04-05 ENCOUNTER — Emergency Department (HOSPITAL_COMMUNITY)
Admission: EM | Admit: 2016-04-05 | Discharge: 2016-04-05 | Disposition: A | Discharge status: Home / Self Care | Payer: Medicare Other | Attending: Emergency Medicine | Admitting: Emergency Medicine

## 2016-04-05 ENCOUNTER — Encounter (HOSPITAL_COMMUNITY): Payer: Self-pay | Admitting: Emergency Medicine

## 2016-04-05 ENCOUNTER — Emergency Department (HOSPITAL_COMMUNITY): Payer: Medicare Other

## 2016-04-05 DIAGNOSIS — R55 Syncope and collapse: Secondary | ICD-10-CM | POA: Insufficient documentation

## 2016-04-05 DIAGNOSIS — Z955 Presence of coronary angioplasty implant and graft: Secondary | ICD-10-CM | POA: Insufficient documentation

## 2016-04-05 DIAGNOSIS — I130 Hypertensive heart and chronic kidney disease with heart failure and stage 1 through stage 4 chronic kidney disease, or unspecified chronic kidney disease: Secondary | ICD-10-CM | POA: Diagnosis not present

## 2016-04-05 DIAGNOSIS — E119 Type 2 diabetes mellitus without complications: Secondary | ICD-10-CM | POA: Insufficient documentation

## 2016-04-05 DIAGNOSIS — N183 Chronic kidney disease, stage 3 (moderate): Secondary | ICD-10-CM | POA: Diagnosis not present

## 2016-04-05 DIAGNOSIS — Z79899 Other long term (current) drug therapy: Secondary | ICD-10-CM | POA: Diagnosis not present

## 2016-04-05 DIAGNOSIS — Z8546 Personal history of malignant neoplasm of prostate: Secondary | ICD-10-CM | POA: Diagnosis not present

## 2016-04-05 DIAGNOSIS — Z8673 Personal history of transient ischemic attack (TIA), and cerebral infarction without residual deficits: Secondary | ICD-10-CM | POA: Diagnosis not present

## 2016-04-05 DIAGNOSIS — I2510 Atherosclerotic heart disease of native coronary artery without angina pectoris: Secondary | ICD-10-CM | POA: Insufficient documentation

## 2016-04-05 DIAGNOSIS — I509 Heart failure, unspecified: Secondary | ICD-10-CM | POA: Insufficient documentation

## 2016-04-05 DIAGNOSIS — R404 Transient alteration of awareness: Secondary | ICD-10-CM | POA: Diagnosis not present

## 2016-04-05 DIAGNOSIS — R42 Dizziness and giddiness: Secondary | ICD-10-CM | POA: Diagnosis not present

## 2016-04-05 DIAGNOSIS — Z7982 Long term (current) use of aspirin: Secondary | ICD-10-CM | POA: Insufficient documentation

## 2016-04-05 LAB — URINALYSIS, ROUTINE W REFLEX MICROSCOPIC
Bilirubin Urine: NEGATIVE
GLUCOSE, UA: NEGATIVE mg/dL
KETONES UR: NEGATIVE mg/dL
LEUKOCYTES UA: NEGATIVE
Nitrite: NEGATIVE
PH: 8 (ref 5.0–8.0)
Protein, ur: NEGATIVE mg/dL
Specific Gravity, Urine: 1.009 (ref 1.005–1.030)

## 2016-04-05 LAB — CBC
HCT: 39.2 % (ref 39.0–52.0)
Hemoglobin: 13.4 g/dL (ref 13.0–17.0)
MCH: 29.1 pg (ref 26.0–34.0)
MCHC: 34.2 g/dL (ref 30.0–36.0)
MCV: 85 fL (ref 78.0–100.0)
Platelets: 216 10*3/uL (ref 150–400)
RBC: 4.61 MIL/uL (ref 4.22–5.81)
RDW: 14.2 % (ref 11.5–15.5)
WBC: 4.3 10*3/uL (ref 4.0–10.5)

## 2016-04-05 LAB — BASIC METABOLIC PANEL
Anion gap: 7 (ref 5–15)
BUN: 16 mg/dL (ref 6–20)
CALCIUM: 9 mg/dL (ref 8.9–10.3)
CO2: 27 mmol/L (ref 22–32)
CREATININE: 1.17 mg/dL (ref 0.61–1.24)
Chloride: 104 mmol/L (ref 101–111)
GFR, EST NON AFRICAN AMERICAN: 55 mL/min — AB (ref >60)
Glucose, Bld: 94 mg/dL (ref 65–99)
Potassium: 4.4 mmol/L (ref 3.5–5.1)
SODIUM: 138 mmol/L (ref 135–145)

## 2016-04-05 LAB — I-STAT TROPONIN, ED: Troponin i, poc: 0 ng/mL (ref 0.00–0.08)

## 2016-04-05 LAB — CBG MONITORING, ED: Glucose-Capillary: 84 mg/dL (ref 65–99)

## 2016-04-05 MED ORDER — SODIUM CHLORIDE 0.9 % IV BOLUS (SEPSIS)
500.0000 mL | Freq: Once | INTRAVENOUS | Status: AC
  Administered 2016-04-05: 500 mL via INTRAVENOUS

## 2016-04-05 NOTE — ED Triage Notes (Signed)
Pt from home via EMS c/o dizziness for the past few days. Pt stated that he felt bad today but got up and walked around and that made him feel better. Pt reports that he is here for eval. Pt adds that takes meclizine for vertigo as needed. Pt is A&O and in NAD

## 2016-04-05 NOTE — ED Provider Notes (Signed)
[facility] DEPT Provider Note   CSN: [id number] Arrival date & time: 04/05/16  1721     History   Chief Complaint Chief Complaint  Patient presents with  . Dizziness    HPI Trevor Andrews is a 83 y.o. male.  The history is provided by the patient. No language interpreter was used.  Dizziness    Trevor Andrews is a 83 y.o. male who presents to the Emergency Department complaining of near syncope.  At home this morning he felt like he was going to pass out while sitting. He feels like he is going to pass out/fall asleep.  He has been feeling intermitently dizzy for the last week.  He denies any chest pain.  He has chronic abdominal pain - unchanged.  He has a hx/o stroke and has frequent headaches.  He denies fevers, vomiting, nausea.Marland Kitchen He is having diarrhea and feels poor in general. He is legally blind.      Past Medical History:  Diagnosis Date  . Arthritis   . Cancer [facility]) 1998   prostate  . CHF (congestive heart failure) ([place])   . CKD (chronic kidney disease), stage II   . Coronary artery disease   . Dementia   . Diabetes mellitus ([place])   . GERD (gastroesophageal reflux disease)   . Hypercholesteremia   . Hypertension   . Shortness of breath dyspnea   . Stroke [facility])    a. on ASA/Plavix - felt 2/2 small vessel disease source per neuro notes.  . Vertigo     Patient Active Problem List   Diagnosis Date Noted  . Bifascicular block 10/19/2015  . CKD (chronic kidney disease), stage III 10/18/2015  . Chronic diastolic CHF (congestive heart failure) ([place]) 10/18/2015  . Nonspecific chest pain 10/18/2015  . Chest pain at rest 10/18/2015  . Dizziness and giddiness 01/17/2015  . Nausea without vomiting 11/23/2014  . Essential hypertension 11/23/2014  . Type 2 diabetes mellitus with other circulatory complications ([place]) [id number]  . HLD (hyperlipidemia) 11/23/2014  . Dizziness 11/23/2014  . Gait apraxia 10/18/2014  . Hypertension 10/18/2014  . Hypercholesteremia  10/18/2014  . Dementia 10/18/2014  . History of coronary artery stent placement 10/18/2014  . Gait abnormality 10/18/2014  . History of arterial ischemic stroke 10/18/2014  . Neurologic gait dysfunction   . Stroke with cerebral ischemia ([place])   . Cerebral infarction due to thrombosis of right middle cerebral artery [facility])     Past Surgical History:  Procedure Laterality Date  . CATARACT EXTRACTION  2011 and 2012   2011lt eye and rt eye 2012  . CORONARY ANGIOPLASTY WITH STENT PLACEMENT    . glauma implant  2012   rt.eye  . PROSTATECTOMY         Home Medications    Prior to Admission medications   Medication Sig Start Date End Date Taking? Authorizing Provider  acetaminophen (TYLENOL) 325 MG tablet Take 2 tablets (650 mg total) by mouth every 6 (six) hours as needed for mild pain (or Fever >/= 101). 10/20/14  Yes Kinnie Feil, MD  allopurinol (ZYLOPRIM) 100 MG tablet Take 200 mg by mouth daily.    Yes Historical Provider, MD  amLODipine (NORVASC) 10 MG tablet Take 10 mg by mouth daily.    Yes Historical Provider, MD  aspirin EC 81 MG tablet Take 81 mg by mouth daily.   Yes Historical Provider, MD  atorvastatin (LIPITOR) 40 MG tablet Take 40 mg by mouth at bedtime.    Yes  Historical Provider, MD  Calcium Carbonate-Vitamin D (CALCIUM 600+D) 600-400 MG-UNIT per tablet Take 1 tablet by mouth 2 (two) times daily.   Yes Historical Provider, MD  cholecalciferol (VITAMIN D) 1000 UNITS tablet Take 1,000 Units by mouth daily.   Yes Historical Provider, MD  clopidogrel (PLAVIX) 75 MG tablet Take 75 mg by mouth every morning.    Yes Historical Provider, MD  dorzolamide-timolol (COSOPT) 22.3-6.8 MG/ML ophthalmic solution Place 2 drops into both eyes Twice daily. 04/21/11  Yes Historical Provider, MD  polyvinyl alcohol (LIQUIFILM TEARS) 1.4 % ophthalmic solution Place 1 drop into both eyes as needed for dry eyes.    Yes Historical Provider, MD  prednisoLONE acetate (PRED FORTE) 1 % ophthalmic  suspension Place 1 drop into both eyes daily.  04/12/11  Yes Historical Provider, MD  tamsulosin (FLOMAX) 0.4 MG CAPS capsule Take 0.4 mg by mouth at bedtime.   Yes Historical Provider, MD  meclizine (ANTIVERT) 50 MG tablet Take 1 tablet (50 mg total) by mouth 3 (three) times daily as needed. Patient not taking: Reported on 04/05/2016 01/17/15   Rosalin Hawking, MD  metoCLOPramide (REGLAN) 10 MG tablet Take 1 tablet (10 mg total) by mouth every 8 (eight) hours as needed for nausea. Patient not taking: Reported on 12/24/2014 11/23/14   Rosalin Hawking, MD  pantoprazole (PROTONIX) 40 MG tablet Take 1 tablet (40 mg total) by mouth daily. Patient not taking: Reported on 04/05/2016 10/19/15   Reyne Dumas, MD    Family History Family History  Problem Relation Age of Onset  . Premature CHD Neg Hx     Social History Social History  Substance Use Topics  . Smoking status: Never Smoker  . Smokeless tobacco: Never Used  . Alcohol use 1.2 oz/week    2 Shots of liquor per week     Comment: rare     Allergies   Patient has no known allergies.   Review of Systems Review of Systems  Neurological: Positive for dizziness.  All other systems reviewed and are negative.    Physical Exam Updated Vital Signs BP 146/74 (BP Location: Left Arm)   Pulse (!) 58   Temp 98.1 F (36.7 C) (Oral)   Resp 16   SpO2 96%   Physical Exam  Constitutional: He is oriented to person, place, and time. He appears well-developed and well-nourished.  HENT:  Head: Normocephalic and atraumatic.  Cardiovascular: Regular rhythm.   No murmur heard. bradycardic  Pulmonary/Chest: Effort normal and breath sounds normal. No respiratory distress.  Abdominal: Soft. There is no tenderness. There is no rebound and no guarding.  Musculoskeletal: He exhibits no edema or tenderness.  Neurological: He is alert and oriented to person, place, and time.  Left eyelid droop.  5/5 strength in all four extremities.  Sensation to light touch  intact in all four extremities.    Skin: Skin is warm and dry.  Psychiatric: He has a normal mood and affect. His behavior is normal.  Nursing note and vitals reviewed.    ED Treatments / Results  Labs (all labs ordered are listed, but only abnormal results are displayed) Labs Reviewed  CBC  BASIC METABOLIC PANEL  URINALYSIS, ROUTINE W REFLEX MICROSCOPIC  CBG MONITORING, ED    EKG  EKG Interpretation  Date/Time:  Friday April 05 2016 17:41:48 EST Ventricular Rate:  57 PR Interval:    QRS Duration: 147 QT Interval:  439 QTC Calculation: 428 R Axis:   -56 Text Interpretation:  Sinus rhythm Multiple ventricular  premature complexes RBBB and LAFB LVH by voltage Confirmed by Hazle Coca [phone]) on 04/05/2016 6:34:01 PM       Radiology No results found.  Procedures Procedures (including critical care time)  Medications Ordered in ED Medications - No data to display   Initial Impression / Assessment and Plan / ED Course  I have reviewed the triage vital signs and the nursing notes.  Pertinent labs & imaging results that were available during my care of the patient were reviewed by me and considered in my medical decision making (see chart for details).     Patient with history of prior CVA here with lightheadedness and near syncope for the last week. Examination is near his baseline. After IV fluids in the emergency department he is feeling improved. He was orthostatic on initial evaluation. Presentation is not consistent with cardiogenic syncope, symptomatic bradycardia, CVA, ACS, PE.  Discussed with patient home care, pcp follow up and return precautions.    Final Clinical Impressions(s) / ED Diagnoses   Final diagnoses:  None    New Prescriptions New Prescriptions   No medications on file     Quintella Reichert, MD 04/06/16 0100

## 2016-04-05 NOTE — ED Notes (Signed)
Bed: WA13 Expected date:  Expected time:  Means of arrival:  Comments: EMS-dizzy 

## 2016-10-15 ENCOUNTER — Ambulatory Visit (INDEPENDENT_AMBULATORY_CARE_PROVIDER_SITE_OTHER): Payer: Medicare Other | Admitting: Student

## 2016-10-15 ENCOUNTER — Encounter: Payer: Self-pay | Admitting: Student

## 2016-10-15 VITALS — BP 118/60 | HR 61 | Temp 98.2°F | Ht 74.0 in | Wt 227.8 lb

## 2016-10-15 DIAGNOSIS — Z7689 Persons encountering health services in other specified circumstances: Secondary | ICD-10-CM | POA: Diagnosis not present

## 2016-10-15 DIAGNOSIS — R7303 Prediabetes: Secondary | ICD-10-CM | POA: Diagnosis not present

## 2016-10-15 DIAGNOSIS — Z23 Encounter for immunization: Secondary | ICD-10-CM | POA: Insufficient documentation

## 2016-10-15 LAB — POCT GLYCOSYLATED HEMOGLOBIN (HGB A1C): Hemoglobin A1C: 6

## 2016-10-15 NOTE — Progress Notes (Signed)
Subjective:    Trevor Andrews is a 83 y.o. old male here to establish care. He is here with his daughter.   HPI Establish and care: patient has no major concerns today. He has been followed at [facility] and Dr. Ellsworth Lennox, who passed away recently. He does not need refill on his medications. He also get his care at [facility] in [place]. 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   PMH/Problem List: has Gait apraxia; Hypertension; Hypercholesteremia; Dementia; History of coronary artery stent placement; Gait abnormality; Neurologic gait dysfunction; History of arterial ischemic stroke; Stroke with cerebral ischemia ([place]); Cerebral infarction due to thrombosis of right middle cerebral artery ([place]); Nausea without vomiting; Essential hypertension; Prediabetes; HLD (hyperlipidemia); Dizziness; Dizziness and giddiness; CKD (chronic kidney disease), stage III; Chronic diastolic CHF (congestive heart failure) ([place]); Nonspecific chest pain; Chest pain at rest; Bifascicular block; and Encounter to establish care on his problem list.   has a past medical history of Arthritis; Blindness; Cancer ([place]) (1998); CHF (congestive heart failure) ([place]); CKD (chronic kidney disease), stage II; Coronary artery disease; Dementia; Diabetes mellitus ([place]); GERD (gastroesophageal reflux disease); Hypercholesteremia; Hypertension; Shortness of breath dyspnea; Stroke ([place]) (2016); and Vertigo.  FH:  Family History  Problem Relation Age of Onset  . Premature CHD Neg Hx     SH Social History  Substance Use Topics  . Smoking status: Never Smoker  . Smokeless tobacco: Never Used  . Alcohol use 1.2 oz/week    2 Shots of liquor per week     Comment: rare    Review of Systems  Constitutional: Negative for fever and unexpected weight change.  HENT: Positive for trouble swallowing.        ?Dysphagia from stroke  Eyes: Positive for visual disturbance.  Respiratory: Negative for cough and shortness of breath.   Cardiovascular: Negative for chest  pain and leg swelling.  Gastrointestinal: Negative for abdominal pain and blood in stool.  Endocrine: Negative for cold intolerance and heat intolerance.  Genitourinary: Negative for difficulty urinating, dysuria and hematuria.  Musculoskeletal: Positive for myalgias.  Neurological: Positive for weakness. Negative for dizziness, numbness and headaches.       Left leg weakness due to stroke  Psychiatric/Behavioral: Negative for dysphoric mood. The patient is not nervous/anxious.       Objective:     Vitals:   10/15/16 1434  BP: 118/60  Pulse: 61  Temp: 98.2 F (36.8 C)  TempSrc: Oral  SpO2: 98%  Weight: 227 lb 12.8 oz (103.3 kg)  Height: 6\' 2"  (1.88 m)   Physical Exam GEN: appears well, no apparent distress. Head: normocephalic and atraumatic  Eyes: Legally blind Oropharynx: mmm without erythema or exudation. Wears denture HEM: negative for cervical or periauricular lymphadenopathies ENDO: negative thyromegally CVS: RRR, nl S1&S2, no murmurs, no edema RESP: no IWOB, good air movement bilaterally, CTAB GI: BS present & normal, soft, NTND GU: no suprapubic MSK: no focal tenderness or notable swelling SKIN: no apparent skin lesion NEURO: alert and oiented appropriately, no gross deficits. Motor 5/5 in all extremities, light sensation intact in all dermatomes  PSYCH: euthymic mood with congruent affect    Assessment and Plan:  Encounter to establish care No concern today. We have updated his history in the chart. He also singed release form to get his medical records from [place]. Patient to follow up in a month for his physical. Advised him to bring all his medication bottles for review.   Prediabetes Stable. A1c 6.0% today, from 6.6% prior. Not on  medication. Continue life style.  Orders Placed This Encounter  Procedures  . HgB A1c    Return in about 1 month (around 11/15/2016) for Physical.  Mercy Riding, MD 10/16/16 Pager: [phone]

## 2016-10-15 NOTE — Patient Instructions (Signed)
It was great seeing you today! I recommended follow-up in one month's for his general checkup.  For medications, please contact the pharmacy and give them my name or address   If we did any lab work today, and the results require attention, either me or my nurse will get in touch with you. If everything is normal, you will get a letter in mail and a message via . If you don't hear from [place] in two weeks, please give [place] a call. Otherwise, we look forward to seeing you again at your next visit. If you have any questions or concerns before then, please call the clinic at [phone].  Please bring all your medications to every doctors visit  Sign up for My Chart to have easy access to your labs results, and communication with your Primary care physician.    Please check-out at the front desk before leaving the clinic.    Take Care,   Dr. Cyndia Skeeters

## 2016-10-16 NOTE — Assessment & Plan Note (Signed)
Stable. A1c 6.0% today, from 6.6% prior. Not on medication. Continue life style.

## 2016-10-16 NOTE — Assessment & Plan Note (Addendum)
No concern today. We have updated his history in the chart. He also singed release form to get his medical records from [place]. Patient to follow up in a month for his physical. Advised him to bring all his medication bottles for review.

## 2016-11-25 ENCOUNTER — Ambulatory Visit (INDEPENDENT_AMBULATORY_CARE_PROVIDER_SITE_OTHER): Payer: Medicare Other | Admitting: Student

## 2016-11-25 ENCOUNTER — Encounter: Payer: Self-pay | Admitting: Student

## 2016-11-25 VITALS — BP 145/89 | HR 65 | Temp 98.1°F | Ht 74.0 in | Wt 223.6 lb

## 2016-11-25 DIAGNOSIS — Z0000 Encounter for general adult medical examination without abnormal findings: Secondary | ICD-10-CM

## 2016-11-25 DIAGNOSIS — Z23 Encounter for immunization: Secondary | ICD-10-CM

## 2016-11-25 MED ORDER — ZOSTER VAC RECOMB ADJUVANTED 50 MCG/0.5ML IM SUSR: 0.5000 mL | Freq: Once | INTRAMUSCULAR | 1 refills | Status: AC

## 2016-11-25 NOTE — Progress Notes (Signed)
Subjective:   Chief Complaint  Patient presents with  . Annual Exam   HPI Trevor Andrews is a 83 y.o. old male here  for annual exam.  Concern today: none Changes in his/her health in the last 12 months: no Occupation:  Wears seatbelt: yes.    The patient has regular exercise: no.  Walks around the house with a walker Enough vegetables and fruits: not enough.  Smokes cigarette: no. Never smoked Drinks EtOH: occasionally Drug use: no Patient takes ASA: yes.  Patient takes vitD & Ca: yes. Ever been transfused or tattooed?: no.  The patient is not sexually active.  Patient uses birth control: not applicable.  Domestic violence: no.  Advance directive: yes. Will bring to [place].   History of depression: no.  Patient dental home: yes. At [facility] Patient has ophthalmologist: at [place] influenza vaccine: yes. Already got it  Needs Shingrix (all >2yrs of age): yes.  Needs Tdap: yes.  Needs Pneumococcal: had Prevnar 13 about a year ago. Need PPCV-23.  Screening At risk for skin cancer: no.  PMH/Problem List: has Gait apraxia; Hypertension; Dementia; History of coronary artery stent placement; Cerebral infarction due to thrombosis of right middle cerebral artery ([place]); Essential hypertension; Prediabetes; HLD (hyperlipidemia); Dizziness; Dizziness and giddiness; CKD (chronic kidney disease), stage III; Chronic diastolic CHF (congestive heart failure) ([place]); Chest pain at rest; Bifascicular block; and Encounter for immunization on his problem list.   has a past medical history of Arthritis; Blindness; Cancer ([place]) (1998); CHF (congestive heart failure) ([place]); CKD (chronic kidney disease), stage II; Coronary artery disease; Dementia; Diabetes mellitus ([place]); GERD (gastroesophageal reflux disease); Hypercholesteremia; Hypertension; Shortness of breath dyspnea; Stroke ([place]) (2016); and Vertigo.  [facility]  Family History  Problem Relation Age of Onset  . Premature CHD Neg Hx     SH Social History  Substance Use Topics  . Smoking status: Never Smoker  . Smokeless tobacco: Never Used  . Alcohol use 1.2 oz/week    2 Shots of liquor per week     Comment: rare     Review of Systems  Constitutional: Negative for fatigue, fever and unexpected weight change.  HENT: Positive for trouble swallowing.        Dysphagia due to stroke  Eyes: Positive for visual disturbance. Negative for pain.       Left eye blindness   Respiratory: Negative for cough and shortness of breath.   Cardiovascular: Negative for chest pain, palpitations and leg swelling.  Gastrointestinal: Negative for abdominal pain, blood in stool, constipation, diarrhea and vomiting.  Genitourinary: Negative for dysuria and hematuria.  Musculoskeletal: Positive for arthralgias.  Skin: Negative for rash.  Neurological: Positive for dizziness. Negative for weakness, light-headedness and headaches.       Spinning. Also tinnitus. These have been going on since CVA. [place] one time about year ago when he tried to take steps instead of the ramp he usually uses  Hematological: Negative for adenopathy. Does not bruise/bleed easily.  Psychiatric/Behavioral: Negative for behavioral problems, confusion and dysphoric mood. The patient is not nervous/anxious.        Objective:   Physical Exam Vitals:   11/25/16 1448  BP: (!) 145/89  Pulse: 65  Temp: 98.1 F (36.7 C)  TempSrc: Oral  SpO2: 97%  Weight: 223 lb 9.6 oz (101.4 kg)  Height: 6\' 2"  (1.88 m)   Body mass index is 28.71 kg/m.  GEN: appears well, no apparent distress. Head: normocephalic and atraumatic  Eyes: conjunctiva  without injection, sclera anicteric. Has a regular pupil shape from prior surgery bilaterally. It seems he also had a drain in iris for his glaucoma bilaterally. No vision in left eye.  Ears: external ear and ear canal normal Nares: no rhinorrhea, congestion or erythema  Oropharynx: mmm without erythema or exudation HEM: negative  for cervical or periauricular lymphadenopathies CVS: RRR, nl s1 & s2, 1/6 SEM over RUSB, no radiation to neck, no edema RESP: no IWOB, good air movement bilaterally, CTAB GI: BS present & normal, soft, NTND GU: no suprapubic or CVA tenderness. Denies LUTS.  MSK: no focal tenderness or notable swelling SKIN: no apparent skin lesion  ENDO: negative thyromegally NEURO: alert and oiented appropriately, motor 5/5 in upper and lower extremities bilaterally. Light sensation intact. Patellar reflex 1+ bilaterally. Gait slow.  PSYCH: euthymic mood with congruent affect    Assessment & Plan:  1. Annual physical exam: went over safety precautions particularly about fall prevention. Gave him handout as well. Patient has a walker at home. Risks for falling include poor vision, unstable gait partly from prior stroke and dizziness. He also had history of dementia. He says he is followed by geriatrician at [facility] in [place]. His daughter stated that they signed a medical release form. However, I still don't have his medical record. I suggested contacting [place] so that they can fax [place] his medical records.  Gave flu, PPCV23 and Tdap today. Gave Rx for shingirix Patient has ophthalmologist and dentist at the [facility].  2. Encounter for immunization - Zoster Vac Recomb Adjuvanted [facility]) injection; Inject 0.5 mLs into the muscle once. Repeat dose in 2 to 6 months.  Dispense: 0.5 mL; Refill: 1    Follow-up in 6 months.  Wendee Beavers PGY-3 Pager [phone] 11/25/16  4:18 PM

## 2016-11-25 NOTE — Patient Instructions (Addendum)
It was great seeing you today! We have addressed the following issues today 1. General  Wellness: I recommend daily walking. I also suggest you follow up with our geriatrician at least once. I still don't have her medical records from [place]. I will let you know if I have any concern when I receive them. Otherwise, will see you back in 6 months.  2.   Please take the prescription for shingles vaccine to your pharmacy to have the shingles immunization.  If we did any lab work today, and the results require attention, either me or my nurse will get in touch with you. If everything is normal, you will get a letter in mail and a message via . If you don't hear from [place] in two weeks, please give [place] a call. Otherwise, we look forward to seeing you again at your next visit. If you have any questions or concerns before then, please call the clinic at [phone].  Please bring all your medications to every doctors visit  Sign up for My Chart to have easy access to your labs results, and communication with your Primary care physician.    Please check-out at the front desk before leaving the clinic.    Take Care,   Dr. Cyndia Skeeters   Fall Prevention in the Home Falls can cause injuries. They can happen to people of all ages. There are many things you can do to make your home safe and to help prevent falls. What can I do on the outside of my home?  Regularly fix the edges of walkways and driveways and fix any cracks.  Remove anything that might make you trip as you walk through a door, such as a raised step or threshold.  Trim any bushes or trees on the path to your home.  Use bright outdoor lighting.  Clear any walking paths of anything that might make someone trip, such as rocks or tools.  Regularly check to see if handrails are loose or broken. Make sure that both sides of any steps have handrails.  Any raised decks and porches should have guardrails on the edges.  Have any leaves, snow, or ice  cleared regularly.  Use sand or salt on walking paths during winter.  Clean up any spills in your garage right away. This includes oil or grease spills. What can I do in the bathroom?  Use night lights.  Install grab bars by the toilet and in the tub and shower. Do not use towel bars as grab bars.  Use non-skid mats or decals in the tub or shower.  If you need to sit down in the shower, use a plastic, non-slip stool.  Keep the floor dry. Clean up any water that spills on the floor as soon as it happens.  Remove soap buildup in the tub or shower regularly.  Attach bath mats securely with double-sided non-slip rug tape.  Do not have throw rugs and other things on the floor that can make you trip. What can I do in the bedroom?  Use night lights.  Make sure that you have a light by your bed that is easy to reach.  Do not use any sheets or blankets that are too big for your bed. They should not hang down onto the floor.  Have a firm chair that has side arms. You can use this for support while you get dressed.  Do not have throw rugs and other things on the floor that can make you  trip. What can I do in the kitchen?  Clean up any spills right away.  Avoid walking on wet floors.  Keep items that you use a lot in easy-to-reach places.  If you need to reach something above you, use a strong step stool that has a grab bar.  Keep electrical cords out of the way.  Do not use floor polish or wax that makes floors slippery. If you must use wax, use non-skid floor wax.  Do not have throw rugs and other things on the floor that can make you trip. What can I do with my stairs?  Do not leave any items on the stairs.  Make sure that there are handrails on both sides of the stairs and use them. Fix handrails that are broken or loose. Make sure that handrails are as long as the stairways.  Check any carpeting to make sure that it is firmly attached to the stairs. Fix any carpet that  is loose or worn.  Avoid having throw rugs at the top or bottom of the stairs. If you do have throw rugs, attach them to the floor with carpet tape.  Make sure that you have a light switch at the top of the stairs and the bottom of the stairs. If you do not have them, ask someone to add them for you. What else can I do to help prevent falls?  Wear shoes that: ? Do not have high heels. ? Have rubber bottoms. ? Are comfortable and fit you well. ? Are closed at the toe. Do not wear sandals.  If you use a stepladder: ? Make sure that it is fully opened. Do not climb a closed stepladder. ? Make sure that both sides of the stepladder are locked into place. ? Ask someone to hold it for you, if possible.  Clearly mark and make sure that you can see: ? Any grab bars or handrails. ? First and last steps. ? Where the edge of each step is.  Use tools that help you move around (mobility aids) if they are needed. These include: ? Canes. ? Walkers. ? Scooters. ? Crutches.  Turn on the lights when you go into a dark area. Replace any light bulbs as soon as they burn out.  Set up your furniture so you have a clear path. Avoid moving your furniture around.  If any of your floors are uneven, fix them.  If there are any pets around you, be aware of where they are.  Review your medicines with your doctor. Some medicines can make you feel dizzy. This can increase your chance of falling. Ask your doctor what other things that you can do to help prevent falls. This information is not intended to replace advice given to you by your health care provider. Make sure you discuss any questions you have with your health care provider. Document Released: 12/15/2008 Document Revised: 07/27/2015 Document Reviewed: 03/25/2014 Elsevier Interactive Patient Education  [employer].

## 2016-12-25 ENCOUNTER — Other Ambulatory Visit: Payer: Self-pay | Admitting: Student

## 2016-12-25 ENCOUNTER — Telehealth: Payer: Self-pay | Admitting: Student

## 2016-12-25 DIAGNOSIS — R42 Dizziness and giddiness: Secondary | ICD-10-CM

## 2016-12-25 NOTE — Telephone Encounter (Signed)
Attempted to call patient about his prescription on gabapentin. Patient didn't pick up the phone. Patient was supposed to take 4 capsules a day, (1 capsule in the morning, 1 capsule during lunch and 2 capsules at bedtime). This will be 1200 mg a day, not 1800 mg a day. He was given prescription for gabapentin 300 mg,120 capsules with 3 refills by Dr. Juliane Lack 2 weeks ago. He can call his pharmacy for refills. Please advise him to take his medications as prescribed.

## 2016-12-25 NOTE — Telephone Encounter (Signed)
Daughter is calling to speak to Dr. Cyndia Skeeters about her father. She wanted to discuss this with the doctor only so not sure what she is needing. jw

## 2016-12-25 NOTE — Telephone Encounter (Signed)
Called and talked to patient's daughter. She states that her father has dizziness (vertigo). She is asking if he can be seen by vestibular PT. I ordered a referral to vestibular PT.  Please disregard my previous in basket message about gabapentin and this patient.

## 2017-02-05 IMAGING — CR DG CHEST 2V
2 series · 2 of 2 positions shown · non-contrast
Comparison: 10/14/2014

CLINICAL DATA: Dizziness.  Headache for several hours.

EXAM:
CHEST  2 VIEW

[w chest lat]
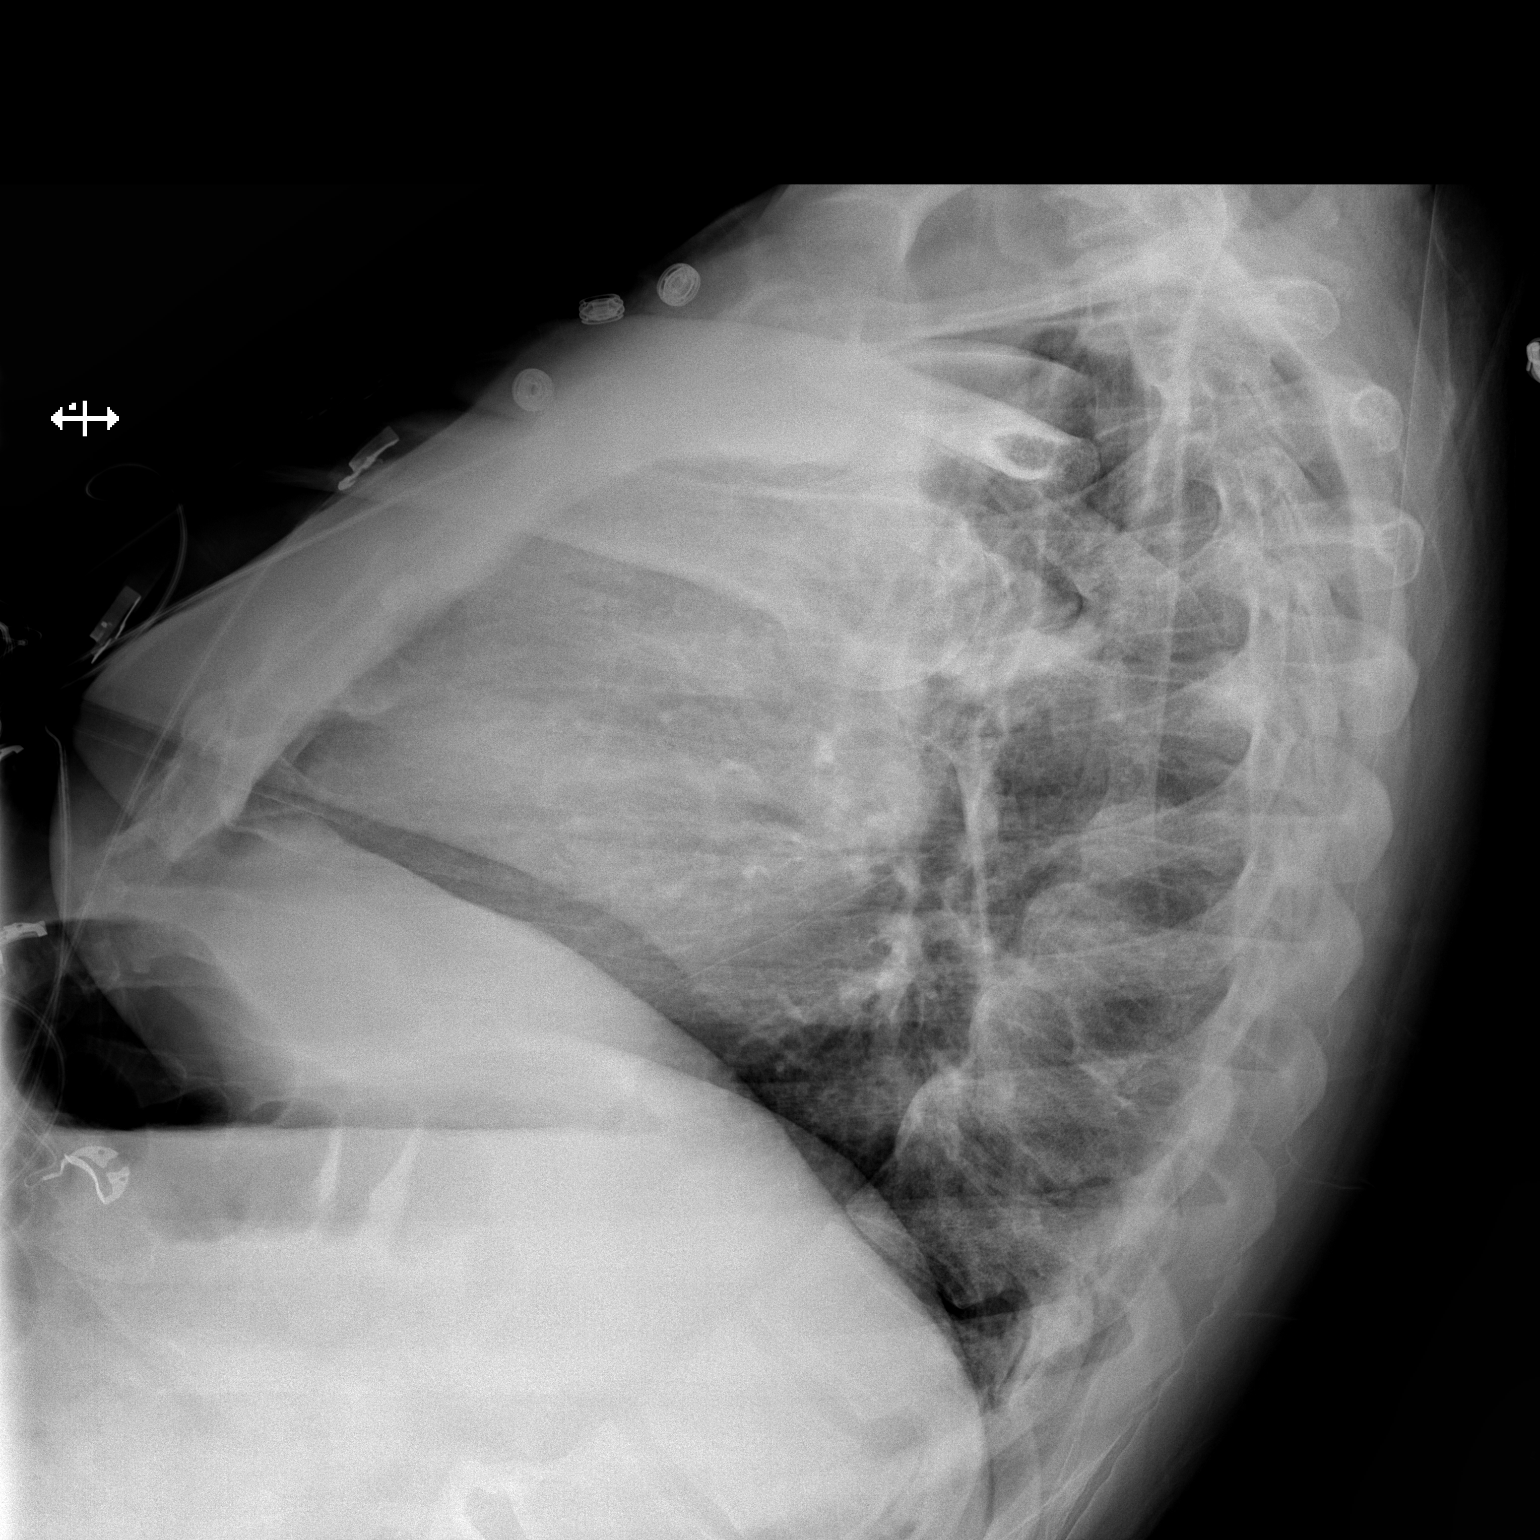

[x chest ap]
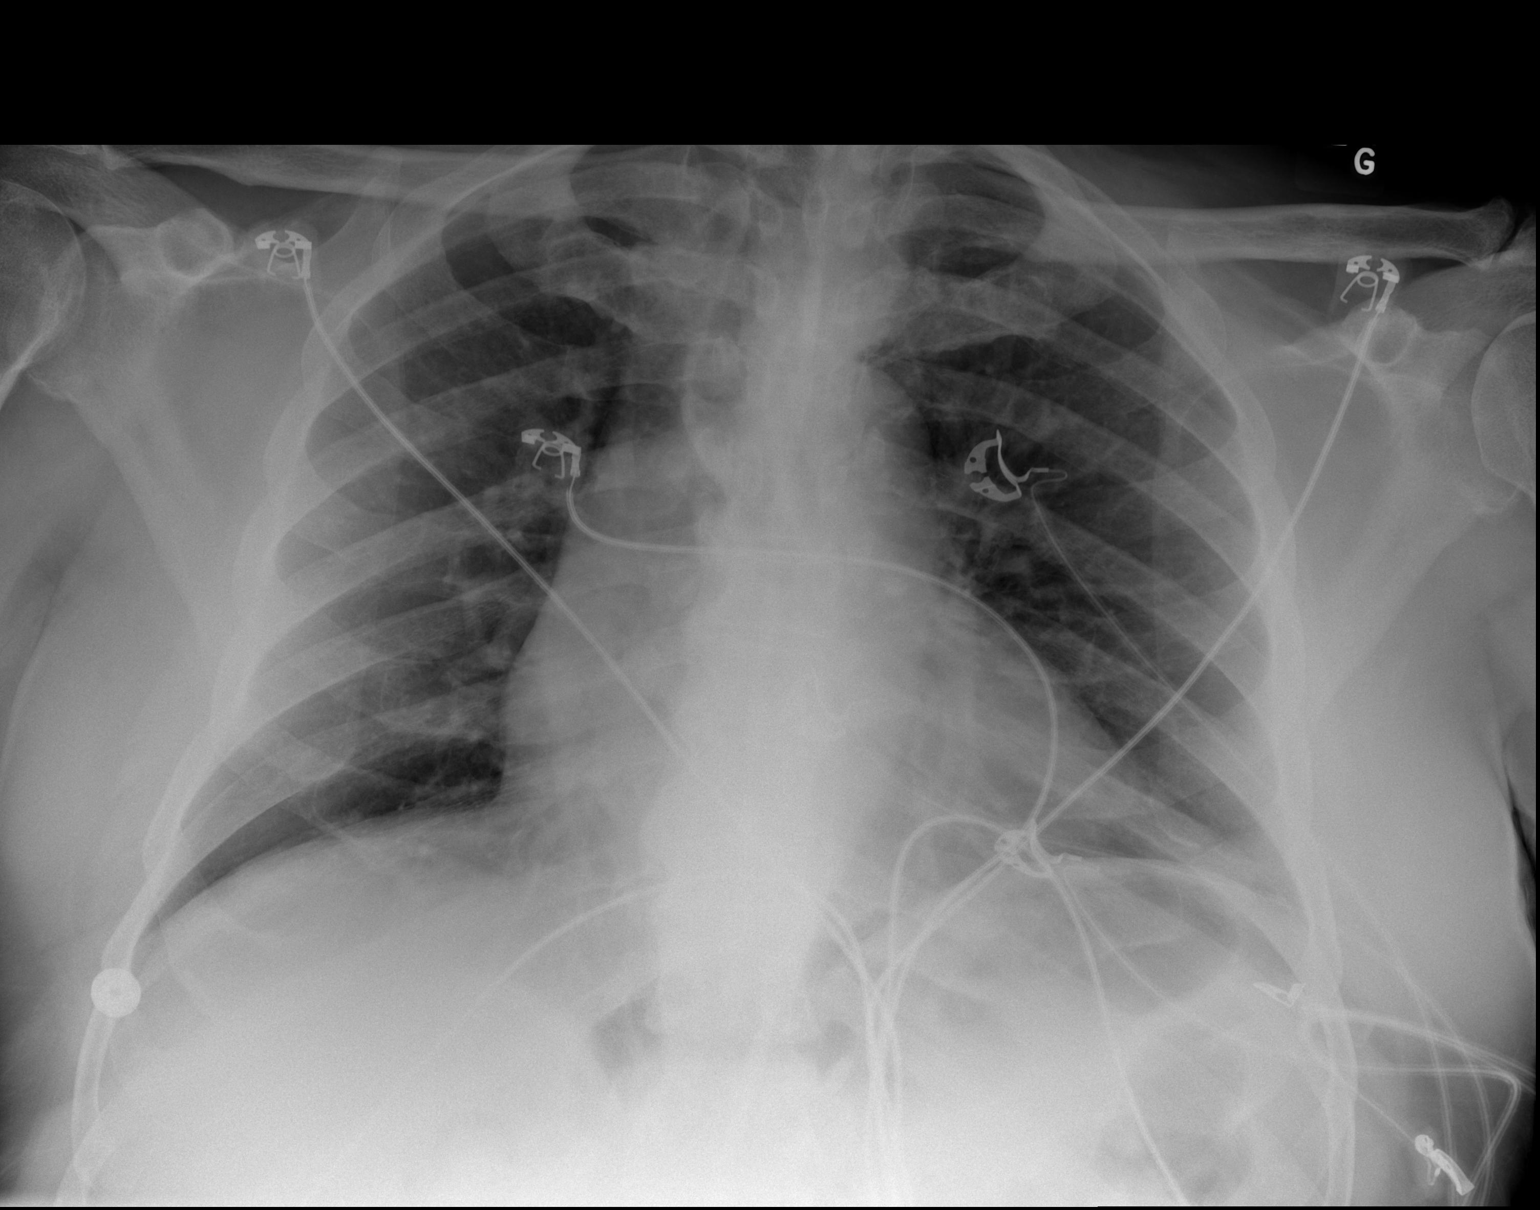

[2 of 2 positions shown; findings below may reference images not displayed]

FINDINGS: The cardiomediastinal contours are unchanged. Pulmonary vasculature
is normal. No consolidation, pleural effusion, or pneumothorax. No
acute osseous abnormalities are seen. Degenerative change in the
spine.
IMPRESSION: No acute pulmonary process.

## 2017-02-24 IMAGING — NM NM BONE WHOLE BODY
2 series · 2 of 2 positions shown · non-contrast
Comparison: Cervical spine MRI 10/18/2014. Thoracic and lumbar MRI
10/14/2014.

CLINICAL DATA: 85-year-old male with prostate cancer. Denies bone
pain. Heterogeneous bone marrow signal in the thoracic and lumbar
spine. Subsequent encounter.

EXAM:
NUCLEAR MEDICINE WHOLE BODY BONE SCAN
TECHNIQUE: Whole body anterior and posterior images were obtained approximately
3 hours after intravenous injection of radiopharmaceutical.
RADIOPHARMACEUTICALS:  26.4 mCi 5echnetium-GGm MDP IV

[Series 1: whole body · 2.66mm/px · 1 of 1 slices shown (1 of 2)]
[im 1/1]
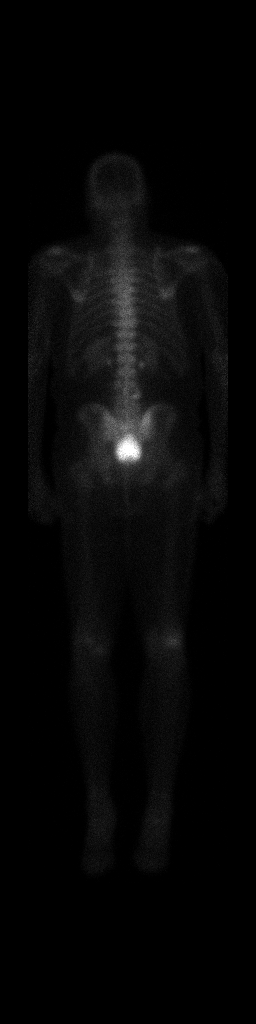

[Series 1: whole body · 2.66mm/px · 1 of 1 slices shown (2 of 2)]
[im 1/1]
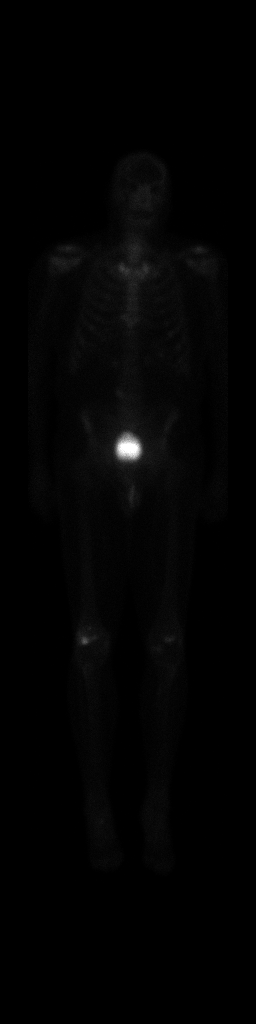

[2 of 2 positions shown; findings below may reference images not displayed]

FINDINGS: Expected radiotracer activity in the kidneys and bladder.

Spinal activity appears homogeneous except at mid lumbar right
vertebral body endplates. The morphology of activity is most
suggestive of degenerative etiology. This is approximately at L3-L4.

Mildly heterogeneous activity about both sternoclavicular,
acromioclavicular, and glenohumeral joints most suggestive of
degenerative activity.

Homogeneous activity in the ribs, skull, and elsewhere in the
visualized upper extremities.

Homogeneous activity about the pelvis. Incidental perineum
contamination.

Heterogeneous increased activity about both knees which appears to
be degenerative in nature. Mild similar activity in the right ankle/
foot. Otherwise lower extremity activity is within normal limits.
IMPRESSION: 1.  No findings specific for metastatic disease to bone.
2. Degenerative appearing activity affecting right lateral mid
lumbar level vertebral endplates, as well as at both shoulders and
knees.

## 2017-06-27 ENCOUNTER — Telehealth: Payer: Self-pay

## 2017-06-27 ENCOUNTER — Encounter: Payer: Self-pay | Admitting: Student

## 2017-06-27 DIAGNOSIS — H4010X0 Unspecified open-angle glaucoma, stage unspecified: Secondary | ICD-10-CM | POA: Insufficient documentation

## 2017-06-27 NOTE — Telephone Encounter (Signed)
Patient's daughter, Donnald Garre, left message requesting refill of Brimonidine eye drops. Not on current med list.  Call back for Ramona is [phone].  Danley Danker, RN [facility] RN)

## 2017-06-27 NOTE — Telephone Encounter (Signed)
Please advise patient to call his ophthalmologist for this. Thank you, Bretta Bang

## 2017-06-30 NOTE — Telephone Encounter (Signed)
LVM to call office back to inform pt daughter of below.  If she calls back please give her the message. Katharina Caper, Trevor Andrews, [place]

## 2017-09-08 ENCOUNTER — Other Ambulatory Visit: Payer: Self-pay

## 2017-09-08 ENCOUNTER — Ambulatory Visit (INDEPENDENT_AMBULATORY_CARE_PROVIDER_SITE_OTHER): Payer: Medicare Other | Admitting: Family Medicine

## 2017-09-08 ENCOUNTER — Encounter: Payer: Self-pay | Admitting: Family Medicine

## 2017-09-08 VITALS — BP 118/62 | HR 42 | Temp 98.2°F | Ht 74.0 in | Wt 224.8 lb

## 2017-09-08 DIAGNOSIS — R42 Dizziness and giddiness: Secondary | ICD-10-CM

## 2017-09-08 DIAGNOSIS — I10 Essential (primary) hypertension: Secondary | ICD-10-CM

## 2017-09-08 DIAGNOSIS — B351 Tinea unguium: Secondary | ICD-10-CM

## 2017-09-08 MED ORDER — AMLODIPINE BESYLATE 5 MG PO TABS: 5.0000 mg | ORAL_TABLET | Freq: Every day | ORAL | 3 refills | Status: AC

## 2017-09-08 NOTE — Progress Notes (Deleted)
   CC: ***  HPI   ROS: ***Denies CP, SOB, abdominal pain, dysuria, changes in BMs.   CC, SH/smoking status, and VS noted  Objective: BP (!) 130/58   Pulse (!) 42   Temp 98.2 F (36.8 C) (Oral)   Ht 6\' 2"  (1.88 m)   Wt 224 lb 12.8 oz (102 kg)   SpO2 97%   BMI 28.86 kg/m  Gen: NAD, alert, cooperative, and pleasant.*** HEENT: NCAT, EOMI, PERRL CV: RRR, no murmur Resp: CTAB, no wheezes, non-labored Abd: SNTND, BS present, no guarding or organomegaly Ext: No edema, warm Neuro: Alert and oriented, Speech clear, No gross deficits  Assessment and plan:  No problem-specific Assessment & Plan notes found for this encounter.   No orders of the defined types were placed in this encounter.   No orders of the defined types were placed in this encounter.   Health Maintenance reviewed - {health maintenance:315237}.  Ralene Ok, MD, PGY3 09/08/2017 3:17 PM

## 2017-09-08 NOTE — Patient Instructions (Addendum)
It was a pleasure to see you today! Thank you for choosing [facility] for your primary care. Trevor Andrews was seen for dizziness.   Our plans for today were:  You can split your blood pressure medicine to make it 5mg  instead of 10mg . I am also sending a new prescription.   Please come back if the dizziness worsens or changes.   I will send you to the podiatrist.   Best,  Dr. Lindell Noe

## 2017-09-08 NOTE — Progress Notes (Signed)
   CC: headache  HPI  Comes with daughter, Donnald Garre, who is his caretaker.  HA - feels faint, funny. Started after his stroke a few years ago. Medicine he got for this helps some. Feels unsteady on his feet. This time present for 1 week. No fever, night time cough. No head pain. At night when he lies flat his head gets hot. Not with orthostatic movements, but only happens while standing.   Toenails - want someone to assess his long toenails, no pain. No lesions.  [facility] - they requested records a while ago, nothing in media tab.   ROS: Denies CP, SOB, abdominal pain, dysuria, changes in BMs.   CC, SH/smoking status, and VS noted  Objective: BP 118/62   Pulse (!) 42   Temp 98.2 F (36.8 C) (Oral)   Ht 6\' 2"  (1.88 m)   Wt 224 lb 12.8 oz (102 kg)   SpO2 97%   BMI 28.86 kg/m   Pulse on manual recheck is 61.  Gen: NAD, alert, cooperative, and pleasant. HEENT: NCAT, EOMI, PERRL, L eye blindness with cloudy cornea and no vision.  CV: RRR, no murmur Resp: CTAB, no wheezes, non-labored Abd: SNTND, BS present, no guarding or organomegaly Ext: No edema, warm.  Onychomycosis to bilateral feet.  No wounds. Neuro: Alert and oriented, Speech clear, No gross deficits.   Assessment and plan:  Dizziness Patient multiple encounters for dizziness ever since his stroke.  Personally reviewed imaging.  No change in his symptomatology from previous notes.  Has had multiple CTs and MRIs for dizziness in the past.  Given his previous MRA with posterior circulation limitations, I will decrease his blood pressure medication to 5mg  in order to facilitate better perfusion.  He presented today mostly to be reassured about whether he can travel to the beach with his family.  I see no reason why he cannot do that today.  He will return to our office or the emergency room if dizziness evolves or worsens.  Onychomycosis Patient requests podiatry referral for assessment and treatment of onychomycosis and overgrown  toenails.  Placed referral.   Orders Placed This Encounter  Procedures  . Ambulatory referral to Podiatry    Referral Priority:   Routine    Referral Type:   Consultation    Referral Reason:   Specialty Services Required    Requested Specialty:   Podiatry    Number of Visits Requested:   1    Meds ordered this encounter  Medications  . amLODipine (NORVASC) 5 MG tablet    Sig: Take 1 tablet (5 mg total) by mouth daily.    Dispense:  90 tablet    Refill:  3    Ralene Ok, MD, PGY3 09/15/2017 3:50 PM

## 2017-09-15 DIAGNOSIS — B351 Tinea unguium: Secondary | ICD-10-CM | POA: Insufficient documentation

## 2017-09-15 NOTE — Assessment & Plan Note (Signed)
Patient requests podiatry referral for assessment and treatment of onychomycosis and overgrown toenails.  Placed referral.

## 2017-09-15 NOTE — Assessment & Plan Note (Signed)
Patient multiple encounters for dizziness ever since his stroke.  Personally reviewed imaging.  No change in his symptomatology from previous notes.  Has had multiple CTs and MRIs for dizziness in the past.  Given his previous MRA with posterior circulation limitations, I will decrease his blood pressure medication to 5mg  in order to facilitate better perfusion.  He presented today mostly to be reassured about whether he can travel to the beach with his family.  I see no reason why he cannot do that today.  He will return to our office or the emergency room if dizziness evolves or worsens.

## 2017-10-15 ENCOUNTER — Encounter: Payer: Self-pay | Admitting: Podiatry

## 2017-10-15 ENCOUNTER — Ambulatory Visit: Payer: Medicare Other | Admitting: Podiatry

## 2017-10-15 DIAGNOSIS — L603 Nail dystrophy: Secondary | ICD-10-CM

## 2017-10-17 NOTE — Progress Notes (Signed)
   Subjective: 84 year old male presenting today as a new patient with a chief complaint of possible fungus of bilateral toenails that have been present for several weeks. There are no modifying factors noted. He has not done anything for treatment. Patient is here for further evaluation and treatment.   Past Medical History:  Diagnosis Date  . Arthritis   . Blindness    due to glaucoma and cataract  . Cancer [facility]) 1998   Prostate. followed by urology  . CHF (congestive heart failure) ([place])   . CKD (chronic kidney disease), stage II   . Coronary artery disease    Had stent placed 2015???  . Dementia   . Diabetes mellitus ([place])   . GERD (gastroesophageal reflux disease)   . Hypercholesteremia   . Hypertension   . Shortness of breath dyspnea   . Stroke [facility]) 2016   a. on ASA/Plavix - felt 2/2 small vessel disease source per neuro notes.  . Vertigo     Objective: Physical Exam General: The patient is alert and oriented x3 in no acute distress.  Dermatology: Hyperkeratotic, discolored, thickened, onychodystrophy of nails noted bilaterally. Skin is warm, dry and supple bilateral lower extremities. Negative for open lesions or macerations.  Vascular: Palpable pedal pulses bilaterally. No edema or erythema noted. Capillary refill within normal limits.  Neurological: Epicritic and protective threshold grossly intact bilaterally.   Musculoskeletal Exam: Range of motion within normal limits to all pedal and ankle joints bilateral. Muscle strength 5/5 in all groups bilateral.   Assessment: #1 dystrophic nails 1-5 bilateral   Plan of Care:  #1 Patient was evaluated. #2 Comprehensive evaluation of bilateral feet and ankles performed.  #3 Recommended maintaining good foot hygiene.  #4 Recommended good shoe gear.  #5 Return to clinic as needed.    Edrick Kins, DPM [facility]  Dr. Edrick Kins, [place]                                         [place], [place] [address]                Office [phone]  Fax [phone]

## 2017-11-12 ENCOUNTER — Ambulatory Visit (INDEPENDENT_AMBULATORY_CARE_PROVIDER_SITE_OTHER): Payer: Medicare Other | Admitting: Family Medicine

## 2017-11-12 ENCOUNTER — Encounter: Payer: Self-pay | Admitting: Family Medicine

## 2017-11-12 ENCOUNTER — Other Ambulatory Visit: Payer: Self-pay

## 2017-11-12 DIAGNOSIS — R059 Cough, unspecified: Secondary | ICD-10-CM

## 2017-11-12 DIAGNOSIS — R05 Cough: Secondary | ICD-10-CM | POA: Diagnosis not present

## 2017-11-12 MED ORDER — BENZONATATE 100 MG PO CAPS: 100.0000 mg | ORAL_CAPSULE | Freq: Two times a day (BID) | ORAL | 0 refills | Status: DC | PRN

## 2017-11-12 NOTE — Patient Instructions (Addendum)
Good to see you today!  Thanks for coming in.  Please call in with the inhaler name  Use the Tessalon perles as needed for cough  If you are not better in one-two weeks then call us  Or if you have fever or shortness of breath then call us immediately   Make an appointment with Dr Lindell Noe for a physical in 1-2 months

## 2017-11-12 NOTE — Assessment & Plan Note (Signed)
Seems most consistent with post viral cough. Will treat with tessalon.  No signs of pneumonia or bronchospasm but patient is using unknown inhaler.   May need to revise how he is using this depending on what it is.

## 2017-11-12 NOTE — Progress Notes (Signed)
Subjective  Trevor Andrews. is a 84 y.o. male is presenting with the following  COUGH  Has been coughing for several weeks. Cough is: hacky mainly at night Sputum production: has some but unsure of color Medications tried: Alkaseltzer Taking blood pressure medications: no   Symptoms Runny nose: yes Mucous in back of throat: yes Throat burning or reflux: no has been using stomach acid medication  Wheezing or asthma: uses inhaler but not sure of name and is not on medication list Fever: no Chest Pain: no Shortness of breath: no Leg swelling: no Hemoptysis: no Weight loss: no  ROS see HPI Smoking Status noted   Chief Complaint noted Review of Symptoms - see HPI PMH - Smoking status noted.    Objective Vital Signs reviewed BP 120/72   Pulse (!) 56   Temp 98.2 F (36.8 C) (Oral)   Ht 6\' 2"  (1.88 m)   Wt 219 lb (99.3 kg)   SpO2 97%   BMI 28.12 kg/m  Communicative but sometimes pauses for a time before responding Accompanied by his daughter Mobility:able to get up and down from exam table without assistance or distress but slowly Lungs:  Normal respiratory effort, chest expands symmetrically. Lungs are clear to auscultation, no crackles or wheezes. Heart - Regular rate and rhythm.  No murmurs, gallops or rubs.    Skin:  Intact without suspicious lesions or rashes Neck:  No deformities, thyromegaly, masses, or tenderness noted.   Supple with full range of motion without pain. Nose: congested  Assessments/Plans  See after visit summary for details of patient instuctions  Cough Seems most consistent with post viral cough. Will treat with tessalon.  No signs of pneumonia or bronchospasm but patient is using unknown inhaler.   May need to revise how he is using this depending on what it is.

## 2017-11-13 ENCOUNTER — Telehealth: Payer: Self-pay

## 2017-11-13 NOTE — Telephone Encounter (Signed)
Pt's son called nurse line, states he was to call back with name of patients inhaler. Pt uses Proair HFA. Did not leave a call back number Wallace Cullens, RN

## 2017-11-26 ENCOUNTER — Encounter (HOSPITAL_COMMUNITY): Payer: Self-pay | Admitting: Emergency Medicine

## 2017-11-26 ENCOUNTER — Emergency Department (HOSPITAL_COMMUNITY)
Admission: EM | Admit: 2017-11-26 | Discharge: 2017-11-27 | Disposition: A | Discharge status: Home / Self Care | Payer: Medicare Other | Attending: Emergency Medicine | Admitting: Emergency Medicine

## 2017-11-26 ENCOUNTER — Emergency Department (HOSPITAL_COMMUNITY): Payer: Medicare Other

## 2017-11-26 DIAGNOSIS — F0390 Unspecified dementia without behavioral disturbance: Secondary | ICD-10-CM | POA: Insufficient documentation

## 2017-11-26 DIAGNOSIS — Z743 Need for continuous supervision: Secondary | ICD-10-CM | POA: Diagnosis not present

## 2017-11-26 DIAGNOSIS — Z7902 Long term (current) use of antithrombotics/antiplatelets: Secondary | ICD-10-CM | POA: Diagnosis not present

## 2017-11-26 DIAGNOSIS — Z7982 Long term (current) use of aspirin: Secondary | ICD-10-CM | POA: Insufficient documentation

## 2017-11-26 DIAGNOSIS — R0902 Hypoxemia: Secondary | ICD-10-CM | POA: Diagnosis not present

## 2017-11-26 DIAGNOSIS — Z8673 Personal history of transient ischemic attack (TIA), and cerebral infarction without residual deficits: Secondary | ICD-10-CM | POA: Insufficient documentation

## 2017-11-26 DIAGNOSIS — I5032 Chronic diastolic (congestive) heart failure: Secondary | ICD-10-CM | POA: Diagnosis not present

## 2017-11-26 DIAGNOSIS — I2510 Atherosclerotic heart disease of native coronary artery without angina pectoris: Secondary | ICD-10-CM | POA: Insufficient documentation

## 2017-11-26 DIAGNOSIS — R42 Dizziness and giddiness: Secondary | ICD-10-CM | POA: Diagnosis not present

## 2017-11-26 DIAGNOSIS — N182 Chronic kidney disease, stage 2 (mild): Secondary | ICD-10-CM | POA: Diagnosis not present

## 2017-11-26 DIAGNOSIS — E1122 Type 2 diabetes mellitus with diabetic chronic kidney disease: Secondary | ICD-10-CM | POA: Diagnosis not present

## 2017-11-26 DIAGNOSIS — I130 Hypertensive heart and chronic kidney disease with heart failure and stage 1 through stage 4 chronic kidney disease, or unspecified chronic kidney disease: Secondary | ICD-10-CM | POA: Insufficient documentation

## 2017-11-26 DIAGNOSIS — Z79899 Other long term (current) drug therapy: Secondary | ICD-10-CM | POA: Diagnosis not present

## 2017-11-26 LAB — BASIC METABOLIC PANEL
Anion gap: 8 (ref 5–15)
BUN: 15 mg/dL (ref 8–23)
CHLORIDE: 106 mmol/L (ref 98–111)
CO2: 26 mmol/L (ref 22–32)
Calcium: 9 mg/dL (ref 8.9–10.3)
Creatinine, Ser: 1.21 mg/dL (ref 0.61–1.24)
GFR calc Af Amer: >60 mL/min (ref >60)
GFR, EST NON AFRICAN AMERICAN: 52 mL/min — AB (ref >60)
GLUCOSE: 128 mg/dL — AB (ref 70–99)
POTASSIUM: 4 mmol/L (ref 3.5–5.1)
Sodium: 140 mmol/L (ref 135–145)

## 2017-11-26 LAB — CBC
HEMATOCRIT: 40.9 % (ref 39.0–52.0)
Hemoglobin: 13.7 g/dL (ref 13.0–17.0)
MCH: 29.7 pg (ref 26.0–34.0)
MCHC: 33.5 g/dL (ref 30.0–36.0)
MCV: 88.5 fL (ref 78.0–100.0)
Platelets: 253 10*3/uL (ref 150–400)
RBC: 4.62 MIL/uL (ref 4.22–5.81)
RDW: 14.3 % (ref 11.5–15.5)
WBC: 4.2 10*3/uL (ref 4.0–10.5)

## 2017-11-26 LAB — CBG MONITORING, ED: Glucose-Capillary: 133 mg/dL — ABNORMAL HIGH (ref 70–99)

## 2017-11-26 NOTE — ED Notes (Signed)
Bed: [facility]PT1 Expected date:  Expected time:  Means of arrival:  Comments: 

## 2017-11-26 NOTE — ED Provider Notes (Signed)
[place] DEPT Provider Note   CSN: [id number] Arrival date & time: 11/26/17  1811     History   Chief Complaint Chief Complaint  Patient presents with  . Dizziness    HPI Trevor Andrews. is a 84 y.o. male.  Patient is an 84 year old male with past medical history of chronic renal insufficiency, coronary artery disease with stent, diabetes, hypertension, and prior CVA.  He is also legally blind secondary to glaucoma.  He presents today for evaluation of dizziness.  This is been occurring intermittently for the past 2 weeks.  He describes this is a lightheadedness that is worse with movement and standing.  He denies any headache, weakness of his arms or legs, or visual changes.  He denies any fevers or chills.  He does report that he has been treated recently for bronchitis.  He has been taking a medication for cough.  The history is provided by the patient.  Dizziness  Quality:  Lightheadedness Severity:  Moderate Duration:  2 weeks Timing:  Intermittent Progression:  Unchanged Chronicity:  New Context: head movement and standing up   Relieved by:  Nothing Worsened by:  Movement and standing up Ineffective treatments:  None tried Associated symptoms: no chest pain, no palpitations and no shortness of breath     Past Medical History:  Diagnosis Date  . Arthritis   . Blindness    due to glaucoma and cataract  . Cancer [facility]) 1998   Prostate. followed by urology  . CHF (congestive heart failure) ([place])   . CKD (chronic kidney disease), stage II   . Coronary artery disease    Had stent placed 2015???  . Dementia   . Diabetes mellitus ([place])   . GERD (gastroesophageal reflux disease)   . Hypercholesteremia   . Hypertension   . Shortness of breath dyspnea   . Stroke [facility]) 2016   a. on ASA/Plavix - felt 2/2 small vessel disease source per neuro notes.  . Vertigo     Patient Active Problem List   Diagnosis Date Noted  . Cough  11/12/2017  . Onychomycosis 09/15/2017  . Open-angle glaucoma 06/27/2017  . Encounter for immunization 10/15/2016  . Bifascicular block 10/19/2015  . CKD (chronic kidney disease), stage III ([place]) 10/18/2015  . Chronic diastolic CHF (congestive heart failure) ([place]) 10/18/2015  . Chest pain at rest 10/18/2015  . Essential hypertension 11/23/2014  . Prediabetes 11/23/2014  . HLD (hyperlipidemia) 11/23/2014  . Dizziness 11/23/2014  . Gait apraxia 10/18/2014  . Hypertension 10/18/2014  . Dementia 10/18/2014  . History of coronary artery stent placement 10/18/2014  . Cerebral infarction due to thrombosis of right middle cerebral artery [facility])     Past Surgical History:  Procedure Laterality Date  . CATARACT EXTRACTION  2011 and 2012   2011lt eye and rt eye 2012  . CORONARY ANGIOPLASTY WITH STENT PLACEMENT  2015???  . glauma implant  2012   rt.eye  . PROSTATECTOMY  1998        Home Medications    Prior to Admission medications   Medication Sig Start Date End Date Taking? Authorizing Provider  acetaminophen (TYLENOL) 325 MG tablet Take 2 tablets (650 mg total) by mouth every 6 (six) hours as needed for mild pain (or Fever >/= 101). 10/20/14   Kinnie Feil, MD  allopurinol (ZYLOPRIM) 100 MG tablet Take 200 mg by mouth daily.     [provider]  amLODipine (NORVASC) 5 MG tablet Take 1 tablet (  5 mg total) by mouth daily. 09/08/17   Sela Hilding, MD  aspirin EC 81 MG tablet Take 81 mg by mouth daily.    [provider]  atorvastatin (LIPITOR) 40 MG tablet Take 40 mg by mouth at bedtime.     [provider]  benzonatate (TESSALON) 100 MG capsule Take 1 capsule (100 mg total) by mouth 2 (two) times daily as needed for cough. 11/12/17   Lind Covert, MD  brimonidine (ALPHAGAN P) 0.1 % SOLN     [provider]  Calcium Carbonate-Vitamin D (CALCIUM 600+D) 600-400 MG-UNIT per tablet Take 1 tablet by mouth 2 (two) times daily.    [provider]  cholecalciferol (VITAMIN D) 1000 UNITS tablet Take 1,000 Units by mouth daily.    [provider]  clopidogrel (PLAVIX) 75 MG tablet Take 75 mg by mouth every morning.     [provider]  dorzolamide-timolol (COSOPT) 22.3-6.8 MG/ML ophthalmic solution Place 2 drops into both eyes Twice daily. 04/21/11   [provider]  polyvinyl alcohol (LIQUIFILM TEARS) 1.4 % ophthalmic solution Place 1 drop into both eyes as needed for dry eyes.     [provider]  prednisoLONE acetate (PRED FORTE) 1 % ophthalmic suspension Place 1 drop into both eyes daily.  04/12/11   [provider]  tamsulosin (FLOMAX) 0.4 MG CAPS capsule Take 0.4 mg by mouth at bedtime.    [provider]    Family History Family History  Problem Relation Age of Onset  . Premature CHD Neg Hx     Social History Social History   Tobacco Use  . Smoking status: Never Smoker  . Smokeless tobacco: Never Used  Substance Use Topics  . Alcohol use: Yes    Alcohol/week: 2.0 standard drinks    Types: 2 Shots of liquor per week    Comment: rare  . Drug use: No     Allergies   Patient has no known allergies.   Review of Systems Review of Systems  Respiratory: Negative for shortness of breath.   Cardiovascular: Negative for chest pain and palpitations.  Neurological: Positive for dizziness.  All other systems reviewed and are negative.    Physical Exam Updated Vital Signs BP 115/67 (BP Location: Left Arm)   Pulse 62   Temp 98.8 F (37.1 C) (Oral)   Resp 16   SpO2 97%   Physical Exam  Constitutional: He is oriented to person, place, and time. He appears well-developed and well-nourished. No distress.  HENT:  Head: Normocephalic and atraumatic.  Mouth/Throat: Oropharynx is clear and moist.  Neck: Normal range of motion. Neck supple.  Cardiovascular: Normal rate and regular rhythm. Exam reveals no friction rub.  No murmur heard. Pulmonary/Chest:  Effort normal and breath sounds normal. No respiratory distress. He has no wheezes. He has no rales.  Abdominal: Soft. Bowel sounds are normal. He exhibits no distension. There is no tenderness.  Musculoskeletal: Normal range of motion. He exhibits no edema.  Neurological: He is alert and oriented to person, place, and time. No cranial nerve deficit. He exhibits normal muscle tone. Coordination normal.  Patient was observed ambulating to the bathroom with assistance.  Per the daughter at bedside, he is at his baseline.  Skin: Skin is warm and dry. He is not diaphoretic.  Nursing note and vitals reviewed.    ED Treatments / Results  Labs (all labs ordered are listed, but only abnormal results are displayed) Labs Reviewed  BASIC METABOLIC  PANEL - Abnormal; Notable for the following components:      Result Value   Glucose, Bld 128 (*)    GFR calc non Af Amer 52 (*)    All other components within normal limits  CBG MONITORING, ED - Abnormal; Notable for the following components:   Glucose-Capillary 133 (*)    All other components within normal limits  CBC  URINALYSIS, ROUTINE W REFLEX MICROSCOPIC    EKG EKG Interpretation  Date/Time:  Wednesday November 26 2017 18:23:16 EDT Ventricular Rate:  59 PR Interval:    QRS Duration: 138 QT Interval:  352 QTC Calculation: 349 R Axis:   -57 Text Interpretation:  Sinus or ectopic atrial rhythm Multiple premature complexes, vent & supraven RBBB and LAFB LVH by voltage Baseline wander in lead(s) V3 similar to previous, rythm indeterminate with PVCs. Confirmed by Charlesetta Shanks [phone]) on 11/26/2017 6:29:01 PM   Radiology No results found.  Procedures Procedures (including critical care time)  Medications Ordered in ED Medications - No data to display   Initial Impression / Assessment and Plan / ED Course  I have reviewed the triage vital signs and the nursing notes.  Pertinent labs & imaging results that were available during my  care of the patient were reviewed by me and considered in my medical decision making (see chart for details).  Patient presents here with complaints of dizziness that I suspect is peripheral vertigo.  His neurologic exam is nonfocal and his gait is at his baseline.  Work-up reveals no significant laboratory abnormalities and head CT is negative.  At this point, I feel as though he is appropriate for discharge.  He is to return as needed for any problems.  Final Clinical Impressions(s) / ED Diagnoses   Final diagnoses:  None    ED Discharge Orders    None       Veryl Speak, MD 11/27/17 0030

## 2017-11-26 NOTE — ED Triage Notes (Addendum)
Patient here from home via EMS with complaints of dizziness x3 days. Reports just getting over bronchitis. No chest pain, no sob, no loc. Legally blind.

## 2017-11-27 MED ORDER — MECLIZINE HCL 25 MG PO TABS: 25.0000 mg | ORAL_TABLET | Freq: Three times a day (TID) | ORAL | 0 refills | Status: DC | PRN

## 2017-11-27 NOTE — Discharge Instructions (Addendum)
Meclizine as prescribed as needed for dizziness.  Follow-up with your primary doctor if not improving in the next few days, and return to the ER if symptoms significantly worsen or change.

## 2017-12-15 ENCOUNTER — Encounter: Payer: Medicare Other | Admitting: Family Medicine

## 2018-04-21 ENCOUNTER — Ambulatory Visit (INDEPENDENT_AMBULATORY_CARE_PROVIDER_SITE_OTHER): Payer: Medicare Other | Admitting: Family Medicine

## 2018-04-21 ENCOUNTER — Encounter: Payer: Self-pay | Admitting: Family Medicine

## 2018-04-21 ENCOUNTER — Other Ambulatory Visit: Payer: Self-pay

## 2018-04-21 VITALS — BP 130/60 | HR 62 | Temp 97.7°F | Wt 218.2 lb

## 2018-04-21 DIAGNOSIS — R7303 Prediabetes: Secondary | ICD-10-CM | POA: Diagnosis not present

## 2018-04-21 DIAGNOSIS — Z23 Encounter for immunization: Secondary | ICD-10-CM

## 2018-04-21 DIAGNOSIS — Z8546 Personal history of malignant neoplasm of prostate: Secondary | ICD-10-CM

## 2018-04-21 DIAGNOSIS — I10 Essential (primary) hypertension: Secondary | ICD-10-CM

## 2018-04-21 LAB — POCT GLYCOSYLATED HEMOGLOBIN (HGB A1C): Hemoglobin A1C: 5.7 % — AB (ref 4.0–5.6)

## 2018-04-21 MED ORDER — CLOPIDOGREL BISULFATE 75 MG PO TABS: 75.0000 mg | ORAL_TABLET | ORAL | 0 refills | Status: DC

## 2018-04-21 NOTE — Progress Notes (Signed)
   CC: Checkup  HPI  DM - no meds.  He eats 2-3 meals per day.  Acid reflux - uses they think maybe zantac.  This has helped him and he has not had further heartburn symptoms.  Daughter wondering about getting PSA - had prostate cancer in 1998.  This was previously followed by [facility] and or the [place].  I cannot find any PSA results in our chart.  Overall he is doing very well.  Insomnia: He reports having trouble falling asleep.  On further questioning, he takes 2-4 naps per day.  He is legally blind, so they feel that light or noise in his room is not affecting his sleep.  ROS: Denies CP, SOB, abdominal pain, dysuria, changes in BMs.   CC, SH/smoking status, and VS noted  Objective: BP 130/60   Pulse 62   Temp 97.7 F (36.5 C) (Oral)   Wt 218 lb 4 oz (99 kg)   SpO2 99%   BMI 28.02 kg/m  Gen: NAD, alert, cooperative, and pleasant. HEENT: NCAT, EOMI, PERRL CV: RRR, no murmur Resp: CTAB, no wheezes, non-labored  Ext: No edema, warm Neuro: Alert and oriented, Speech clear, No gross deficits  Assessment and plan:  Essential hypertension   Recheck BMP today, well controlled.  Continue Norvasc.  Prediabetes A1c remains normal, no further follow-up.  History of prostate cancer patient denies any urinary symptoms.  He is not on any treatment for prostate cancer.  I cannot find any scanned results.  I encouraged him to call the urologist if he has further questions, but I do not see the need for repeat PSA today.  Insomnia: Patient was counseled on good sleep hygiene including possibly decreasing naps or at least trying to eliminate the nap that is closest to bedtime.  At his age, he is not a great candidate for any particular sleep medicine.  I counseled them that they may be able to try a very low-dose of melatonin over-the-counter if they desire.  Orders Placed This Encounter  Procedures  . Pneumococcal polysaccharide vaccine 23-valent greater than or equal to 2yo  subcutaneous/IM  . Flu vaccine HIGH DOSE PF  . Basic metabolic panel  . CBC  . HgB A1c    Meds ordered this encounter  Medications  . clopidogrel (PLAVIX) 75 MG tablet    Sig: Take 1 tablet (75 mg total) by mouth every morning.    Dispense:  90 tablet    Refill:  0     Ralene Ok, MD, PGY3 04/23/2018 12:04 PM

## 2018-04-21 NOTE — Patient Instructions (Signed)
It was a pleasure to see you today! Thank you for choosing [facility] for your primary care. Trevor Andrews. was seen for checkup.   Our plans for today were:  You look like you are doing great. If you are worried about talking about the prostate number, you could ask the urologist, but I don't think you need to check this.   Keep taking your blood pressure medicine.   For sleep, please turn off TV or screens an hour or so before sleep, make the room dark and cold, and try to avoid several daytime naps. You could try a low dose of over the counter melatonin if you would like.   Best,  Dr. Lindell Noe

## 2018-04-22 LAB — BASIC METABOLIC PANEL
BUN / CREAT RATIO: 12 (ref 10–24)
BUN: 13 mg/dL (ref 8–27)
CHLORIDE: 103 mmol/L (ref 96–106)
CO2: 25 mmol/L (ref 20–29)
Calcium: 9.4 mg/dL (ref 8.6–10.2)
Creatinine, Ser: 1.08 mg/dL (ref 0.76–1.27)
GFR calc non Af Amer: 62 mL/min/{1.73_m2} (ref >59)
GFR, EST AFRICAN AMERICAN: 71 mL/min/{1.73_m2} (ref >59)
GLUCOSE: 87 mg/dL (ref 65–99)
POTASSIUM: 4.3 mmol/L (ref 3.5–5.2)
SODIUM: 140 mmol/L (ref 134–144)

## 2018-04-22 LAB — CBC
HEMATOCRIT: 41.6 % (ref 37.5–51.0)
Hemoglobin: 14 g/dL (ref 13.0–17.7)
MCH: 30 pg (ref 26.6–33.0)
MCHC: 33.7 g/dL (ref 31.5–35.7)
MCV: 89 fL (ref 79–97)
PLATELETS: 191 10*3/uL (ref 150–450)
RBC: 4.66 x10E6/uL (ref 4.14–5.80)
RDW: 15.1 % (ref 11.6–15.4)
WBC: 4.4 10*3/uL (ref 3.4–10.8)

## 2018-04-23 ENCOUNTER — Telehealth: Payer: Self-pay | Admitting: *Deleted

## 2018-04-23 DIAGNOSIS — Z8546 Personal history of malignant neoplasm of prostate: Secondary | ICD-10-CM | POA: Insufficient documentation

## 2018-04-23 NOTE — Telephone Encounter (Signed)
-----   Message from Sela Hilding, MD sent at 04/23/2018  8:38 AM EST ----- Please let his daughter know his labs look great. No change to our plans.

## 2018-04-23 NOTE — Assessment & Plan Note (Signed)
Recheck BMP today, well controlled.  Continue Norvasc.

## 2018-04-23 NOTE — Assessment & Plan Note (Signed)
A1c remains normal, no further follow-up.

## 2018-04-23 NOTE — Assessment & Plan Note (Signed)
patient denies any urinary symptoms.  He is not on any treatment for prostate cancer.  I cannot find any scanned results.  I encouraged him to call the urologist if he has further questions, but I do not see the need for repeat PSA today.

## 2018-04-23 NOTE — Telephone Encounter (Signed)
Informed pt of below. Zimmerman Rumple, Elyon Zoll D, CMA  

## 2018-09-05 ENCOUNTER — Emergency Department (HOSPITAL_COMMUNITY): Payer: No Typology Code available for payment source

## 2018-09-05 ENCOUNTER — Emergency Department (HOSPITAL_COMMUNITY)
Admission: EM | Admit: 2018-09-05 | Discharge: 2018-09-05 | Disposition: A | Discharge status: Home / Self Care | Payer: No Typology Code available for payment source | Attending: Emergency Medicine | Admitting: Emergency Medicine

## 2018-09-05 ENCOUNTER — Encounter (HOSPITAL_COMMUNITY): Payer: Self-pay

## 2018-09-05 ENCOUNTER — Other Ambulatory Visit: Payer: Self-pay

## 2018-09-05 DIAGNOSIS — R1032 Left lower quadrant pain: Secondary | ICD-10-CM | POA: Diagnosis not present

## 2018-09-05 DIAGNOSIS — I5032 Chronic diastolic (congestive) heart failure: Secondary | ICD-10-CM | POA: Insufficient documentation

## 2018-09-05 DIAGNOSIS — I491 Atrial premature depolarization: Secondary | ICD-10-CM | POA: Diagnosis not present

## 2018-09-05 DIAGNOSIS — R109 Unspecified abdominal pain: Secondary | ICD-10-CM | POA: Insufficient documentation

## 2018-09-05 DIAGNOSIS — E1122 Type 2 diabetes mellitus with diabetic chronic kidney disease: Secondary | ICD-10-CM | POA: Diagnosis not present

## 2018-09-05 DIAGNOSIS — I493 Ventricular premature depolarization: Secondary | ICD-10-CM | POA: Diagnosis not present

## 2018-09-05 DIAGNOSIS — Z7902 Long term (current) use of antithrombotics/antiplatelets: Secondary | ICD-10-CM | POA: Diagnosis not present

## 2018-09-05 DIAGNOSIS — Z79899 Other long term (current) drug therapy: Secondary | ICD-10-CM | POA: Insufficient documentation

## 2018-09-05 DIAGNOSIS — G8929 Other chronic pain: Secondary | ICD-10-CM | POA: Diagnosis not present

## 2018-09-05 DIAGNOSIS — I130 Hypertensive heart and chronic kidney disease with heart failure and stage 1 through stage 4 chronic kidney disease, or unspecified chronic kidney disease: Secondary | ICD-10-CM | POA: Insufficient documentation

## 2018-09-05 DIAGNOSIS — R002 Palpitations: Secondary | ICD-10-CM

## 2018-09-05 DIAGNOSIS — Z7982 Long term (current) use of aspirin: Secondary | ICD-10-CM | POA: Insufficient documentation

## 2018-09-05 DIAGNOSIS — R42 Dizziness and giddiness: Secondary | ICD-10-CM | POA: Diagnosis not present

## 2018-09-05 DIAGNOSIS — R1084 Generalized abdominal pain: Secondary | ICD-10-CM | POA: Diagnosis not present

## 2018-09-05 DIAGNOSIS — I10 Essential (primary) hypertension: Secondary | ICD-10-CM | POA: Diagnosis not present

## 2018-09-05 DIAGNOSIS — N183 Chronic kidney disease, stage 3 (moderate): Secondary | ICD-10-CM | POA: Diagnosis not present

## 2018-09-05 DIAGNOSIS — F0390 Unspecified dementia without behavioral disturbance: Secondary | ICD-10-CM | POA: Insufficient documentation

## 2018-09-05 LAB — COMPREHENSIVE METABOLIC PANEL
ALT: 14 U/L (ref 0–44)
AST: 15 U/L (ref 15–41)
Albumin: 3.4 g/dL — ABNORMAL LOW (ref 3.5–5.0)
Alkaline Phosphatase: 52 U/L (ref 38–126)
Anion gap: 9 (ref 5–15)
BUN: 13 mg/dL (ref 8–23)
CO2: 23 mmol/L (ref 22–32)
Calcium: 9 mg/dL (ref 8.9–10.3)
Chloride: 107 mmol/L (ref 98–111)
Creatinine, Ser: 1.12 mg/dL (ref 0.61–1.24)
GFR calc Af Amer: >60 mL/min (ref >60)
GFR calc non Af Amer: 59 mL/min — ABNORMAL LOW (ref >60)
Glucose, Bld: 102 mg/dL — ABNORMAL HIGH (ref 70–99)
Potassium: 3.7 mmol/L (ref 3.5–5.1)
Sodium: 139 mmol/L (ref 135–145)
Total Bilirubin: 0.6 mg/dL (ref 0.3–1.2)
Total Protein: 6.8 g/dL (ref 6.5–8.1)

## 2018-09-05 LAB — CBC WITH DIFFERENTIAL/PLATELET
Abs Immature Granulocytes: 0.02 10*3/uL (ref 0.00–0.07)
Basophils Absolute: 0 10*3/uL (ref 0.0–0.1)
Basophils Relative: 1 %
Eosinophils Absolute: 0.1 10*3/uL (ref 0.0–0.5)
Eosinophils Relative: 2 %
HCT: 41.9 % (ref 39.0–52.0)
Hemoglobin: 14 g/dL (ref 13.0–17.0)
Immature Granulocytes: 1 %
Lymphocytes Relative: 32 %
Lymphs Abs: 1.4 10*3/uL (ref 0.7–4.0)
MCH: 29.4 pg (ref 26.0–34.0)
MCHC: 33.4 g/dL (ref 30.0–36.0)
MCV: 88 fL (ref 80.0–100.0)
Monocytes Absolute: 0.6 10*3/uL (ref 0.1–1.0)
Monocytes Relative: 13 %
Neutro Abs: 2.2 10*3/uL (ref 1.7–7.7)
Neutrophils Relative %: 51 %
Platelets: 196 10*3/uL (ref 150–400)
RBC: 4.76 MIL/uL (ref 4.22–5.81)
RDW: 13.5 % (ref 11.5–15.5)
WBC: 4.3 10*3/uL (ref 4.0–10.5)
nRBC: 0 % (ref 0.0–0.2)

## 2018-09-05 LAB — URINALYSIS, ROUTINE W REFLEX MICROSCOPIC
Bacteria, UA: NONE SEEN
Bilirubin Urine: NEGATIVE
Glucose, UA: NEGATIVE mg/dL
Ketones, ur: NEGATIVE mg/dL
Nitrite: NEGATIVE
Protein, ur: NEGATIVE mg/dL
Specific Gravity, Urine: 1.01 (ref 1.005–1.030)
pH: 6 (ref 5.0–8.0)

## 2018-09-05 LAB — TROPONIN I (HIGH SENSITIVITY): Troponin I (High Sensitivity): 9 ng/L (ref <18)

## 2018-09-05 LAB — MAGNESIUM: Magnesium: 2 mg/dL (ref 1.7–2.4)

## 2018-09-05 LAB — LIPASE, BLOOD: Lipase: 35 U/L (ref 11–51)

## 2018-09-05 MED ORDER — SODIUM CHLORIDE 0.9 % IV BOLUS
1000.0000 mL | Freq: Once | INTRAVENOUS | Status: AC
  Administered 2018-09-05: 1000 mL via INTRAVENOUS

## 2018-09-05 NOTE — ED Triage Notes (Signed)
From home via ems; c/o LUQ abd pain, L flank pain, and palpitations that started this am; pt has hx acid reflux says abd discomfort not unusual, has been intermittent  x 1 week; pt also states her got in the shower this am, got dizzy, felt like heart was racing; subsided when pt laid down on bed; hx stent placement, memory loss, DM, HTN, legally blind  649 ASA taken at home (pt took twice) No nitro given d/t cardiac discomfort resolved 12 lead with ems showed R BBB, occasional bigeminy  RR 16 97% RA 97.39F CBG 130 172/80 HR 62

## 2018-09-05 NOTE — ED Notes (Signed)
Pt reports he is unable to sign e-signature due to not being able to see.

## 2018-09-05 NOTE — ED Notes (Signed)
Patient transported to CT 

## 2018-09-05 NOTE — ED Provider Notes (Signed)
[facility] EMERGENCY DEPARTMENT Provider Note   CSN: [id number] Arrival date & time: 09/05/18  1116    History   Chief Complaint Chief Complaint  Patient presents with   Abdominal Pain   Palpitations    HPI Trevor Andrews. is a 85 y.o. male.     85 yo M with multiple complaints.  Patient states that for the past couple weeks he has been having off and on abdominal pain.  Is also has maybe been going on longer.  Usually takes Pepcid or Pepto-Bismol and it resolves.  Left-sided sharp comes and goes.  He also has been feeling dizzy ever since he was diagnosed with a stroke.  This also seems to come and go.  Usually improves with lying back flat and resting or taking a meclizine.  He also had palpitations that occurred today.  States that he was taking a shower and felt like his heart started beating fast.  He went and laid down on the bed after doing a round of his nebulizer.  The history is provided by the patient.  Abdominal Pain Pain location:  LLQ Pain quality: sharp   Pain radiates to:  Does not radiate Pain severity:  Moderate Onset quality:  Gradual Duration:  2 weeks Timing:  Intermittent Progression:  Waxing and waning Chronicity:  Chronic Relieved by:  Nothing Worsened by:  Nothing Ineffective treatments:  None tried Associated symptoms: no chest pain, no chills, no diarrhea, no fever, no shortness of breath and no vomiting   Palpitations Associated symptoms: no chest pain, no shortness of breath and no vomiting     Past Medical History:  Diagnosis Date   Arthritis    Blindness    due to glaucoma and cataract   Cancer ([place]) 1998   Prostate. followed by urology   CHF (congestive heart failure) ([place])    CKD (chronic kidney disease), stage II    Coronary artery disease    Had stent placed 2015???   Dementia ([place])    Diabetes mellitus ([place])    GERD (gastroesophageal reflux disease)    Hypercholesteremia    Hypertension     Shortness of breath dyspnea    Stroke ([place]) 2016   a. on ASA/Plavix - felt 2/2 small vessel disease source per neuro notes.   Vertigo     Patient Active Problem List   Diagnosis Date Noted   History of prostate cancer 04/23/2018   Cough 11/12/2017   Onychomycosis 09/15/2017   Open-angle glaucoma 06/27/2017   Encounter for immunization 10/15/2016   Bifascicular block 10/19/2015   CKD (chronic kidney disease), stage III ([place]) 10/18/2015   Chronic diastolic CHF (congestive heart failure) ([place]) 10/18/2015   Chest pain at rest 10/18/2015   Essential hypertension 11/23/2014   Prediabetes 11/23/2014   HLD (hyperlipidemia) 11/23/2014   Dizziness 11/23/2014   Gait apraxia 10/18/2014   Hypertension 10/18/2014   Dementia ([place]) 10/18/2014   History of coronary artery stent placement 10/18/2014   Cerebral infarction due to thrombosis of right middle cerebral artery [facility])     Past Surgical History:  Procedure Laterality Date   CATARACT EXTRACTION  2011 and 2012   2011lt eye and rt eye 2012   CORONARY ANGIOPLASTY WITH STENT PLACEMENT  2015???   glauma implant  2012   rt.eye   [place] Medications    Prior to Admission medications   Medication Sig Start Date End Date Taking? Authorizing  Provider  acetaminophen (TYLENOL) 325 MG tablet Take 2 tablets (650 mg total) by mouth every 6 (six) hours as needed for mild pain (or Fever >/= 101). 10/20/14   Kinnie Feil, MD  allopurinol (ZYLOPRIM) 100 MG tablet Take 200 mg by mouth daily.     [provider]  amLODipine (NORVASC) 5 MG tablet Take 1 tablet (5 mg total) by mouth daily. 09/08/17   Sela Hilding, MD  aspirin EC 81 MG tablet Take 81 mg by mouth daily.    [provider]  atorvastatin (LIPITOR) 40 MG tablet Take 40 mg by mouth at bedtime.     [provider]  benzonatate (TESSALON) 100 MG capsule Take 1 capsule (100 mg total) by mouth 2 (two) times  daily as needed for cough. 11/12/17   Lind Covert, MD  brimonidine (ALPHAGAN P) 0.1 % SOLN     [provider]  Calcium Carbonate-Vitamin D (CALCIUM 600+D) 600-400 MG-UNIT per tablet Take 1 tablet by mouth 2 (two) times daily.    [provider]  cholecalciferol (VITAMIN D) 1000 UNITS tablet Take 1,000 Units by mouth daily.    [provider]  clopidogrel (PLAVIX) 75 MG tablet Take 1 tablet (75 mg total) by mouth every morning. 04/21/18   Sela Hilding, MD  dorzolamide-timolol (COSOPT) 22.3-6.8 MG/ML ophthalmic solution Place 2 drops into both eyes Twice daily. 04/21/11   [provider]  polyvinyl alcohol (LIQUIFILM TEARS) 1.4 % ophthalmic solution Place 1 drop into both eyes as needed for dry eyes.     [provider]  prednisoLONE acetate (PRED FORTE) 1 % ophthalmic suspension Place 1 drop into both eyes daily.  04/12/11   [provider]  tamsulosin (FLOMAX) 0.4 MG CAPS capsule Take 0.4 mg by mouth at bedtime.    [provider]    Family History Family History  Problem Relation Age of Onset   Premature CHD Neg Hx     Social History Social History   Tobacco Use   Smoking status: Never Smoker   Smokeless tobacco: Never Used  Substance Use Topics   Alcohol use: Yes    Alcohol/week: 2.0 standard drinks    Types: 2 Shots of liquor per week    Comment: rare   Drug use: No     Allergies   Patient has no known allergies.   Review of Systems Review of Systems  Constitutional: Negative for chills and fever.  HENT: Negative for congestion and facial swelling.   Eyes: Negative for discharge and visual disturbance.  Respiratory: Negative for shortness of breath.   Cardiovascular: Positive for palpitations. Negative for chest pain.  Gastrointestinal: Positive for abdominal pain. Negative for diarrhea and vomiting.  Musculoskeletal: Negative for arthralgias and myalgias.  Skin: Negative for color change  and rash.  Neurological: Negative for tremors, syncope and headaches.  Psychiatric/Behavioral: Negative for confusion and dysphoric mood.     Physical Exam Updated Vital Signs BP (!) 157/91    Pulse 66    Temp 98.5 F (36.9 C) (Oral)    Resp 17    Ht 6\' 1"  (1.854 m)    Wt 97.5 kg    SpO2 97%    BMI 28.37 kg/m   Physical Exam Vitals signs and nursing note reviewed.  Constitutional:      Appearance: He is well-developed.  HENT:     Head: Normocephalic and atraumatic.  Eyes:     Pupils: Pupils are equal, round, and reactive to light.  Neck:     Musculoskeletal: Normal range of motion and neck supple.     Vascular: No JVD.  Cardiovascular:     Rate and Rhythm: Normal rate and regular rhythm.     Heart sounds: No murmur. No friction rub. No gallop.   Pulmonary:     Effort: No respiratory distress.     Breath sounds: No wheezing.  Abdominal:     General: There is no distension.     Tenderness: There is no guarding or rebound.  Musculoskeletal: Normal range of motion.  Skin:    Coloration: Skin is not pale.     Findings: No rash.  Neurological:     Mental Status: He is alert and oriented to person, place, and time.  Psychiatric:        Behavior: Behavior normal.      ED Treatments / Results  Labs (all labs ordered are listed, but only abnormal results are displayed) Labs Reviewed  COMPREHENSIVE METABOLIC PANEL - Abnormal; Notable for the following components:      Result Value   Glucose, Bld 102 (*)    Albumin 3.4 (*)    GFR calc non Af Amer 59 (*)    All other components within normal limits  URINALYSIS, ROUTINE W REFLEX MICROSCOPIC - Abnormal; Notable for the following components:   Hgb urine dipstick SMALL (*)    Leukocytes,Ua MODERATE (*)    All other components within normal limits  CBC WITH DIFFERENTIAL/PLATELET  LIPASE, BLOOD  MAGNESIUM  TROPONIN I (HIGH SENSITIVITY)    EKG None  Radiology Dg Chest Port 1 View  Result Date: 09/05/2018 CLINICAL  DATA:  Chest pain, shortness of breath. EXAM: PORTABLE CHEST 1 VIEW COMPARISON:  Radiographs of October 18, 2015. FINDINGS: The heart size and mediastinal contours are within normal limits. Both lungs are clear. No pneumothorax or pleural effusion is noted. Elevated left hemidiaphragm is noted. The visualized skeletal structures are unremarkable. IMPRESSION: No active disease. Electronically Signed   By: Marijo Conception M.D.   On: 09/05/2018 12:03   Ct Renal Stone Study  Result Date: 09/05/2018 CLINICAL DATA:  85 year old male with left upper quadrant pain and palpitations EXAM: CT ABDOMEN AND PELVIS WITHOUT CONTRAST TECHNIQUE: Multidetector CT imaging of the abdomen and pelvis was performed following the standard protocol without IV contrast. COMPARISON:  Prior CT abdomen/pelvis 09/19/2008 FINDINGS: Lower chest: No acute abnormality. Incompletely imaged cardiomegaly. Hepatobiliary: No focal liver abnormality is seen. No gallstones, gallbladder wall thickening, or biliary dilatation. Pancreas: Unremarkable. No pancreatic ductal dilatation or surrounding inflammatory changes. Spleen: Normal in size without focal abnormality. Adrenals/Urinary Tract: Adrenal glands are unremarkable. No evidence of hydronephrosis or nephrolithiasis. Extrarenal pelvis bilaterally. 2.4 cm water attenuation lesion in the posterior interpolar right kidney is incompletely characterized in the absence of intravenous contrast. Remote imaging from 2010 demonstrates a small cyst in the same vicinity. The ureters are unremarkable. The bladder is mildly distended. There is a focal diverticulum at the bladder dome. Stomach/Bowel: Colonic diverticular disease without CT evidence of active inflammation. Normal appendix in the right lower quadrant. No focal bowel wall thickening or evidence of obstruction. Vascular/Lymphatic: Atherosclerotic calcifications throughout the abdominal aorta. Limited evaluation in the absence of intravenous contrast. No  suspicious lymphadenopathy. Reproductive: Prostate is unremarkable. Other: No abdominal wall hernia or abnormality. No abdominopelvic ascites. Musculoskeletal: No acute fracture or aggressive appearing lytic or blastic osseous lesion. Multilevel degenerative disc disease. IMPRESSION: 1. No acute abnormality within the abdomen or pelvis. 2. Colonic diverticular disease  without CT evidence of active inflammation. 3. Multilevel degenerative disc disease. 4.  Aortic Atherosclerosis (ICD10-170.0) 5. Incompletely imaged cardiomegaly. Electronically Signed   By: Jacqulynn Cadet M.D.   On: 09/05/2018 13:41    Procedures Procedures (including critical care time)  Medications Ordered in ED Medications  sodium chloride 0.9 % bolus 1,000 mL (0 mLs Intravenous Stopped 09/05/18 1407)     Initial Impression / Assessment and Plan / ED Course  I have reviewed the triage vital signs and the nursing notes.  Pertinent labs & imaging results that were available during my care of the patient were reviewed by me and considered in my medical decision making (see chart for details).        85 yo M with multiple complaints.  The patient is also a poor historian.  It took at least 30 minutes to try and gather why he had come to the emergency department today.  Since he is most concerned about his palpitations which have resolved.  He also has been having shortness of breath off and on for the past couple weeks and has also had abdominal pain for the same at a time.  Will obtain laboratory evaluation chest x-ray CT scan of the abdomen pelvis. Place on tele.   Frequent PVCs but no noted arrhythmia on the telemetry monitor.  Patient with white blood cells in his bladder but no bacteria.  CT scan is negative for acute pathology.  No significant electrolyte abnormality.  We will have him follow-up with his family doctor.  Discharge home.  2:16 PM:  I have discussed the diagnosis/risks/treatment options with the patient and  believe the pt to be eligible for discharge home to follow-up with PCP. We also discussed returning to the ED immediately if new or worsening sx occur. We discussed the sx which are most concerning (e.g., sudden worsening pain, fever, inability to tolerate by mouth) that necessitate immediate return. Medications administered to the patient during their visit and any new prescriptions provided to the patient are listed below.  Medications given during this visit Medications  sodium chloride 0.9 % bolus 1,000 mL (0 mLs Intravenous Stopped 09/05/18 1407)     The patient appears reasonably screen and/or stabilized for discharge and I doubt any other medical condition or other [facility] requiring further screening, evaluation, or treatment in the ED at this time prior to discharge.    Final Clinical Impressions(s) / ED Diagnoses   Final diagnoses:  Palpitations  Chronic abdominal pain  Frequent PVCs    ED Discharge Orders    None       Deno Etienne, DO 09/05/18 1416

## 2018-09-05 NOTE — ED Notes (Signed)
Per RN and tech patient's left eye looks swollen compared to when he came in. Pt's daughter contacted to come to bedside and look to see if it looks swollen to her.

## 2018-09-05 NOTE — ED Notes (Signed)
Pt discharged from the ED without incident. Will follow up with primary care doctor per MD's orders.

## 2018-09-05 NOTE — ED Triage Notes (Signed)
DaughterMarchia [phone]

## 2018-09-05 NOTE — ED Notes (Signed)
Pt daughter and MD at bedside.

## 2018-09-05 NOTE — ED Notes (Signed)
Pt given discharge instructions, follow up information and the opportunity to ask questions. Pt verbalized understanding. IV removed. Pt's daughter updated by RN and given discharge instructions.

## 2018-09-05 NOTE — Discharge Instructions (Signed)
Your work-up in the emergency department is unremarkable.  The CT scan of your abdomen and pelvis did not show any acute pathology.  A troponin on you which is a measure for heart damage was negative.  You have no significant electrolyte abnormality.  Please follow-up with your family doctor.  They may want to place you on a Holter monitor to try and capture if you are having heart arrhythmias.  Please return to the emergency department for worsening abdominal pain fever inability to eat or drink or persistent palpitations.

## 2018-11-27 ENCOUNTER — Other Ambulatory Visit: Payer: Self-pay

## 2018-11-27 ENCOUNTER — Emergency Department (HOSPITAL_COMMUNITY): Payer: No Typology Code available for payment source

## 2018-11-27 ENCOUNTER — Emergency Department (HOSPITAL_COMMUNITY)
Admission: EM | Admit: 2018-11-27 | Discharge: 2018-11-27 | Disposition: A | Discharge status: Home / Self Care | Payer: No Typology Code available for payment source | Attending: Emergency Medicine | Admitting: Emergency Medicine

## 2018-11-27 ENCOUNTER — Encounter (HOSPITAL_COMMUNITY): Payer: Self-pay

## 2018-11-27 DIAGNOSIS — E1122 Type 2 diabetes mellitus with diabetic chronic kidney disease: Secondary | ICD-10-CM | POA: Insufficient documentation

## 2018-11-27 DIAGNOSIS — Z8546 Personal history of malignant neoplasm of prostate: Secondary | ICD-10-CM | POA: Diagnosis not present

## 2018-11-27 DIAGNOSIS — Z7901 Long term (current) use of anticoagulants: Secondary | ICD-10-CM | POA: Diagnosis not present

## 2018-11-27 DIAGNOSIS — Z8673 Personal history of transient ischemic attack (TIA), and cerebral infarction without residual deficits: Secondary | ICD-10-CM | POA: Insufficient documentation

## 2018-11-27 DIAGNOSIS — Z950 Presence of cardiac pacemaker: Secondary | ICD-10-CM | POA: Insufficient documentation

## 2018-11-27 DIAGNOSIS — I509 Heart failure, unspecified: Secondary | ICD-10-CM | POA: Diagnosis not present

## 2018-11-27 DIAGNOSIS — I483 Typical atrial flutter: Secondary | ICD-10-CM | POA: Diagnosis not present

## 2018-11-27 DIAGNOSIS — Z79899 Other long term (current) drug therapy: Secondary | ICD-10-CM | POA: Insufficient documentation

## 2018-11-27 DIAGNOSIS — I130 Hypertensive heart and chronic kidney disease with heart failure and stage 1 through stage 4 chronic kidney disease, or unspecified chronic kidney disease: Secondary | ICD-10-CM | POA: Insufficient documentation

## 2018-11-27 DIAGNOSIS — H547 Unspecified visual loss: Secondary | ICD-10-CM | POA: Diagnosis not present

## 2018-11-27 DIAGNOSIS — R002 Palpitations: Secondary | ICD-10-CM | POA: Diagnosis present

## 2018-11-27 DIAGNOSIS — N183 Chronic kidney disease, stage 3 (moderate): Secondary | ICD-10-CM | POA: Diagnosis not present

## 2018-11-27 DIAGNOSIS — F0390 Unspecified dementia without behavioral disturbance: Secondary | ICD-10-CM | POA: Diagnosis not present

## 2018-11-27 LAB — BASIC METABOLIC PANEL
Anion gap: 8 (ref 5–15)
BUN: 11 mg/dL (ref 8–23)
CO2: 25 mmol/L (ref 22–32)
Calcium: 9.2 mg/dL (ref 8.9–10.3)
Chloride: 105 mmol/L (ref 98–111)
Creatinine, Ser: 1.19 mg/dL (ref 0.61–1.24)
GFR calc Af Amer: >60 mL/min (ref >60)
GFR calc non Af Amer: 55 mL/min — ABNORMAL LOW (ref >60)
Glucose, Bld: 142 mg/dL — ABNORMAL HIGH (ref 70–99)
Potassium: 3.7 mmol/L (ref 3.5–5.1)
Sodium: 138 mmol/L (ref 135–145)

## 2018-11-27 LAB — CBC
HCT: 42.1 % (ref 39.0–52.0)
Hemoglobin: 14.1 g/dL (ref 13.0–17.0)
MCH: 30.1 pg (ref 26.0–34.0)
MCHC: 33.5 g/dL (ref 30.0–36.0)
MCV: 89.8 fL (ref 80.0–100.0)
Platelets: 197 10*3/uL (ref 150–400)
RBC: 4.69 MIL/uL (ref 4.22–5.81)
RDW: 14.6 % (ref 11.5–15.5)
WBC: 4.3 10*3/uL (ref 4.0–10.5)
nRBC: 0 % (ref 0.0–0.2)

## 2018-11-27 LAB — TROPONIN I (HIGH SENSITIVITY)
Troponin I (High Sensitivity): 11 ng/L (ref <18)
Troponin I (High Sensitivity): 12 ng/L (ref <18)

## 2018-11-27 LAB — BRAIN NATRIURETIC PEPTIDE: B Natriuretic Peptide: 98.4 pg/mL (ref 0.0–100.0)

## 2018-11-27 LAB — MAGNESIUM: Magnesium: 2.1 mg/dL (ref 1.7–2.4)

## 2018-11-27 MED ORDER — METOPROLOL TARTRATE 25 MG PO TABS: 25.0000 mg | ORAL_TABLET | ORAL | 0 refills | Status: DC | PRN

## 2018-11-27 MED ORDER — SODIUM CHLORIDE 0.9% FLUSH: 3.0000 mL | Freq: Once | INTRAVENOUS | Status: DC

## 2018-11-27 MED ORDER — APIXABAN 5 MG PO TABS: 5.0000 mg | ORAL_TABLET | Freq: Two times a day (BID) | ORAL | 0 refills | Status: DC

## 2018-11-27 MED ORDER — METOPROLOL TARTRATE 25 MG PO TABS
25.0000 mg | ORAL_TABLET | Freq: Once | ORAL | Status: AC
  Administered 2018-11-27: 20:00:00 25 mg via ORAL
  Filled 2018-11-27: qty 1

## 2018-11-27 MED ORDER — APIXABAN 5 MG PO TABS
5.0000 mg | ORAL_TABLET | Freq: Two times a day (BID) | ORAL | Status: DC
  Administered 2018-11-27: 20:00:00 5 mg via ORAL
  Filled 2018-11-27: qty 1

## 2018-11-27 NOTE — ED Provider Notes (Addendum)
[facility] EMERGENCY DEPARTMENT Provider Note   CSN: [id number] Arrival date & time: 11/27/18  1518     History   Chief Complaint Chief Complaint  Patient presents with  . Palpitations    HPI Trevor Andrews. is a 85 y.o. male.     HPI  This patient is an 85 year old male who is legally blind, he has a history of prostate cancer, history of congestive heart failure and a history of prior stroke.  He had a coronary stent placed about 5 years ago.  He [facility] [place].  The patient denies a history of arrhythmia.  His medical record shows that he does take both aspirin and Plavix.  He presents with a complaint of palpitations which have been intermittent over the last month.  He has not had any specific complaints today and at this time is having no symptoms.  Occasionally he feels short of breath when the palpitations occur, they are not induced with exertion or position or eating.  He has not had any syncopal events, no chest pain or back pain, no swelling of the legs.  Past Medical History:  Diagnosis Date  . Arthritis   . Blindness    due to glaucoma and cataract  . Cancer [facility]) 1998   Prostate. followed by urology  . CHF (congestive heart failure) ([place])   . CKD (chronic kidney disease), stage II   . Coronary artery disease    Had stent placed 2015???  . Dementia ([place])   . Diabetes mellitus ([place])   . GERD (gastroesophageal reflux disease)   . Hypercholesteremia   . Hypertension   . Shortness of breath dyspnea   . Stroke [facility]) 2016   a. on ASA/Plavix - felt 2/2 small vessel disease source per neuro notes.  . Vertigo     Patient Active Problem List   Diagnosis Date Noted  . History of prostate cancer 04/23/2018  . Cough 11/12/2017  . Onychomycosis 09/15/2017  . Open-angle glaucoma 06/27/2017  . Encounter for immunization 10/15/2016  . Bifascicular block 10/19/2015  . CKD  (chronic kidney disease), stage III ([place]) 10/18/2015  . Chronic diastolic CHF (congestive heart failure) ([place]) 10/18/2015  . Chest pain at rest 10/18/2015  . Essential hypertension 11/23/2014  . Prediabetes 11/23/2014  . HLD (hyperlipidemia) 11/23/2014  . Dizziness 11/23/2014  . Gait apraxia 10/18/2014  . Hypertension 10/18/2014  . Dementia ([place]) 10/18/2014  . History of coronary artery stent placement 10/18/2014  . Cerebral infarction due to thrombosis of right middle cerebral artery [facility])     Past Surgical History:  Procedure Laterality Date  . CATARACT EXTRACTION  2011 and 2012   2011lt eye and rt eye 2012  . CORONARY ANGIOPLASTY WITH STENT PLACEMENT  2015???  . glauma implant  2012   rt.eye  . PROSTATECTOMY  1998        Home Medications    Prior to Admission medications   Medication Sig Start Date End Date Taking? Authorizing Provider  acetaminophen (TYLENOL) 325 MG tablet Take 2 tablets (650 mg total) by mouth every 6 (six) hours as needed for mild pain (or Fever >/= 101). 10/20/14   Kinnie Feil, MD  allopurinol (ZYLOPRIM) 100 MG tablet Take 200 mg by mouth daily.     [provider]  amLODipine (NORVASC) 5 MG tablet Take 1 tablet (5 mg total) by mouth daily. 09/08/17   Glenis Smoker, MD  apixaban (ELIQUIS) 5 MG TABS tablet Take 1 tablet (5 mg total) by mouth 2 (two) times daily. 11/27/18 12/27/18  Noemi Chapel, MD  atorvastatin (LIPITOR) 40 MG tablet Take 40 mg by mouth at bedtime.     [provider]  benzonatate (TESSALON) 100 MG capsule Take 1 capsule (100 mg total) by mouth 2 (two) times daily as needed for cough. 11/12/17   Lind Covert, MD  brimonidine (ALPHAGAN P) 0.1 % SOLN     [provider]  Calcium Carbonate-Vitamin D (CALCIUM 600+D) 600-400 MG-UNIT per tablet Take 1 tablet by mouth 2 (two) times daily.    [provider]  cholecalciferol (VITAMIN D) 1000 UNITS tablet Take 1,000 Units by mouth daily.     [provider]  dorzolamide-timolol (COSOPT) 22.3-6.8 MG/ML ophthalmic solution Place 2 drops into both eyes Twice daily. 04/21/11   [provider]  metoprolol tartrate (LOPRESSOR) 25 MG tablet Take 1 tablet (25 mg total) by mouth as needed. 11/27/18   Noemi Chapel, MD  polyvinyl alcohol (LIQUIFILM TEARS) 1.4 % ophthalmic solution Place 1 drop into both eyes as needed for dry eyes.     [provider]  prednisoLONE acetate (PRED FORTE) 1 % ophthalmic suspension Place 1 drop into both eyes daily.  04/12/11   [provider]  tamsulosin (FLOMAX) 0.4 MG CAPS capsule Take 0.4 mg by mouth at bedtime.    [provider]    Family History Family History  Problem Relation Age of Onset  . Premature CHD Neg Hx     Social History Social History   Tobacco Use  . Smoking status: Never Smoker  . Smokeless tobacco: Never Used  Substance Use Topics  . Alcohol use: Yes    Alcohol/week: 2.0 standard drinks    Types: 2 Shots of liquor per week    Comment: rare  . Drug use: No     Allergies   Patient has no known allergies.   Review of Systems Review of Systems  All other systems reviewed and are negative.    Physical Exam Updated Vital Signs BP (!) 163/73   Pulse (!) 57   Temp 98.4 F (36.9 C) (Oral)   Resp 16   SpO2 99%   Physical Exam Vitals signs and nursing note reviewed.  Constitutional:      General: He is not in acute distress.    Appearance: He is well-developed.  HENT:     Head: Normocephalic and atraumatic.     Mouth/Throat:     Pharynx: No oropharyngeal exudate.  Eyes:     General: No scleral icterus.       Right eye: No discharge.        Left eye: No discharge.     Conjunctiva/sclera: Conjunctivae normal.  Neck:     Musculoskeletal: Normal range of motion and neck supple.     Thyroid: No thyromegaly.     Vascular: No JVD.  Cardiovascular:     Rate and Rhythm: Normal rate and regular rhythm.     Heart sounds:  Normal heart sounds. No murmur. No friction rub. No gallop.      Comments: Occasional ectopy, strong pulses radial arteries, no JVD or edema Pulmonary:     Effort: Pulmonary effort is normal. No respiratory distress.     Breath sounds: Normal breath sounds. No wheezing or rales.  Abdominal:     General: Bowel sounds are normal. There is no distension.     Palpations: Abdomen is  soft. There is no mass.     Tenderness: There is no abdominal tenderness.  Musculoskeletal: Normal range of motion.        General: No tenderness.  Lymphadenopathy:     Cervical: No cervical adenopathy.  Skin:    General: Skin is warm and dry.     Findings: No erythema or rash.  Neurological:     Mental Status: He is alert.     Coordination: Coordination normal.  Psychiatric:        Behavior: Behavior normal.      ED Treatments / Results  Labs (all labs ordered are listed, but only abnormal results are displayed) Labs Reviewed  BASIC METABOLIC PANEL - Abnormal; Notable for the following components:      Result Value   Glucose, Bld 142 (*)    GFR calc non Af Amer 55 (*)    All other components within normal limits  CBC  MAGNESIUM  BRAIN NATRIURETIC PEPTIDE  TROPONIN I (HIGH SENSITIVITY)  TROPONIN I (HIGH SENSITIVITY)    EKG ED ECG REPORT  I personally interpreted this EKG   Date: 11/27/2018   Rate: 57  Rhythm: atrial flutter with 4:1 conduction delay  QRS Axis: left  Intervals: normal  ST/T Wave abnormalities: nonspecific ST/T changes  Conduction Disutrbances:right bundle branch block  Narrative Interpretation:   Old EKG Reviewed: unchanged  Radiology Dg Chest 2 View  Result Date: 11/27/2018 CLINICAL DATA:  Palpitations. EXAM: CHEST - 2 VIEW COMPARISON:  None. FINDINGS: Stable mild elevation of left hemidiaphragm with overall lung volumes. No signs of consolidation or pleural effusion. Cardiomediastinal contours are normal.  No acute bone findings. IMPRESSION: No active cardiopulmonary  disease. Electronically Signed   By: Zetta Bills M.D.   On: 11/27/2018 16:12    Procedures Procedures (including critical care time)  Medications Ordered in ED Medications  sodium chloride flush (NS) 0.9 % injection 3 mL (has no administration in time range)  apixaban (ELIQUIS) tablet 5 mg (has no administration in time range)     Initial Impression / Assessment and Plan / ED Course  I have reviewed the triage vital signs and the nursing notes.  Pertinent labs & imaging results that were available during my care of the patient were reviewed by me and considered in my medical decision making (see chart for details).  Clinical Course as of Nov 28 742  Fri Nov 27, 2018  1805 Discussed with cardiology who will call with recommendations regarding anticoagulation given high CHA2VASC.  He has not had any decompensation or significant arrhythmia   [BM]  1901 No change in troponin levels, discussed with cardiology will put into atrial fibrillation clinic, Eliquis and metoprolol given prior to discharge, no tacky arrhythmias just rate controlled atrial flutter seen   [BM]    Clinical Course User Index [BM] Noemi Chapel, MD       The patient is in no distress, at this time he appears to have no symptoms, his EKG shows that he has likely underlying atrial flutter with a 4-1 conduction delay, he has a right bundle branch block which is known, he is not having any chest pain and his troponin is negative.  He has a metabolic panel showing mild hyperglycemia but no signs of anion gap acidosis and a CBC showing no leukocytosis or anemia.  His troponin is 11  I have personally viewed his 2 view PA and lateral chest x-ray, I see no signs of infiltrates effusions pneumothorax or abnormal mediastinum.  There is no signs of cardiopulmonary disease that are evident on the x-ray.  There is no effusions.  His left hemidiaphragm is higher than the right.  This has been seen in the past when I compared to  prior x-rays.  I agree with the radiologist  Given unremarkable labs I will repeat a troponin but otherwise the patient appears to be stable for discharge.  He is rate controlled but may need a pill in the pocket type treatment for possible new arrhythmia.  Will refer to the A. fib clinic.  This patients CHA2DS2-VASc Score and unadjusted Ischemic Stroke Rate (% per year) is equal to 11.2 % stroke rate/year from a score of 7  Above score calculated as 1 point each if present [CHF, HTN, DM, Vascular=MI/PAD/Aortic Plaque, Age if 65-74, or Male] Above score calculated as 2 points each if present [Age > 75, or Stroke/TIA/TE]  D/w cardiology service who recommends that the patient can be discharged home, they will arrange follow-up in the outpatient setting.  The patient will be started on Eliquis  Final Clinical Impressions(s) / ED Diagnoses   Final diagnoses:  Typical atrial flutter [facility])    ED Discharge Orders         Ordered    Amb referral to AFIB Clinic     11/27/18 1634    apixaban (ELIQUIS) 5 MG TABS tablet  2 times daily     11/27/18 1846    metoprolol tartrate (LOPRESSOR) 25 MG tablet  As needed     11/27/18 1846           Noemi Chapel, MD 11/27/18 1851    Noemi Chapel, MD 11/28/18 [phone]

## 2018-11-27 NOTE — Discharge Instructions (Signed)
The cardiology service is going to have you follow-up with them.  Please see the information below regarding your new medications  Stop Plavix immediately (clopidogrel) Stop aspirin immediately  Start taking Eliquis, 1 tablet twice a day.  This is a blood thinner which will help prevent blood clots  You may take 1 tablet of metoprolol if you feel like your heart is racing.  If it continues to race despite this medication you should return to the emergency department immediately  Return to the emergency department for any severe worsening symptoms.

## 2018-11-27 NOTE — ED Triage Notes (Signed)
Pt from home with ems for c.o palpitations, denies chest pain/sob/n/v. Ems reports pt initially sinus tach 115 and is now Sinus brady in the 60s with type 2 2nd degree heart block, pt does not have a pacemaker. PT BLIND. A.o, nad noted VSS

## 2018-11-27 NOTE — ED Notes (Signed)
Triage EKG did not cross over into pt chart. Completed at 1518, given to Sabra Heck, MD.

## 2018-11-30 ENCOUNTER — Telehealth (HOSPITAL_COMMUNITY): Payer: Self-pay | Admitting: Nurse Practitioner

## 2018-11-30 NOTE — Telephone Encounter (Signed)
Called and left message for patient to call A-Fib Clinic to schedule appt.  Pt seen in [facility] ED 11/27/2018 and needs f/u appt with A-Fib Clinic per Kristin @[facility]  & referral from ED.

## 2018-12-03 ENCOUNTER — Ambulatory Visit (HOSPITAL_COMMUNITY): Payer: Medicare Other | Admitting: Nurse Practitioner

## 2018-12-09 ENCOUNTER — Other Ambulatory Visit: Payer: Self-pay

## 2018-12-09 ENCOUNTER — Encounter (HOSPITAL_COMMUNITY): Payer: Self-pay | Admitting: Nurse Practitioner

## 2018-12-09 ENCOUNTER — Ambulatory Visit (HOSPITAL_COMMUNITY)
Admission: RE | Admit: 2018-12-09 | Discharge: 2018-12-09 | Disposition: A | Discharge status: Home / Self Care | Payer: Medicare Other | Source: Ambulatory Visit | Attending: Nurse Practitioner | Admitting: Nurse Practitioner

## 2018-12-09 VITALS — BP 150/92 | HR 75 | Ht 73.0 in | Wt 217.0 lb

## 2018-12-09 DIAGNOSIS — E1122 Type 2 diabetes mellitus with diabetic chronic kidney disease: Secondary | ICD-10-CM | POA: Insufficient documentation

## 2018-12-09 DIAGNOSIS — Z8673 Personal history of transient ischemic attack (TIA), and cerebral infarction without residual deficits: Secondary | ICD-10-CM | POA: Diagnosis not present

## 2018-12-09 DIAGNOSIS — I484 Atypical atrial flutter: Secondary | ICD-10-CM

## 2018-12-09 DIAGNOSIS — K219 Gastro-esophageal reflux disease without esophagitis: Secondary | ICD-10-CM | POA: Diagnosis not present

## 2018-12-09 DIAGNOSIS — I509 Heart failure, unspecified: Secondary | ICD-10-CM | POA: Diagnosis not present

## 2018-12-09 DIAGNOSIS — F0390 Unspecified dementia without behavioral disturbance: Secondary | ICD-10-CM | POA: Diagnosis not present

## 2018-12-09 DIAGNOSIS — H548 Legal blindness, as defined in USA: Secondary | ICD-10-CM | POA: Insufficient documentation

## 2018-12-09 DIAGNOSIS — Z79899 Other long term (current) drug therapy: Secondary | ICD-10-CM | POA: Insufficient documentation

## 2018-12-09 DIAGNOSIS — I130 Hypertensive heart and chronic kidney disease with heart failure and stage 1 through stage 4 chronic kidney disease, or unspecified chronic kidney disease: Secondary | ICD-10-CM | POA: Diagnosis not present

## 2018-12-09 DIAGNOSIS — Z7901 Long term (current) use of anticoagulants: Secondary | ICD-10-CM | POA: Insufficient documentation

## 2018-12-09 DIAGNOSIS — N182 Chronic kidney disease, stage 2 (mild): Secondary | ICD-10-CM | POA: Diagnosis not present

## 2018-12-09 DIAGNOSIS — I2510 Atherosclerotic heart disease of native coronary artery without angina pectoris: Secondary | ICD-10-CM | POA: Insufficient documentation

## 2018-12-09 NOTE — Progress Notes (Signed)
Primary Care Physician: Wilber Oliphant, MD Referring Physician: [facility] ER f/u Primary Cardiologist: Thayer Dallas   Hunt Oris Chester Coupland. is a 85 y.o. male with a h/o legal blindness,  HTN, CAD, CVA, CHF, and palpitations . He presented to the ER with palpitations 9/25 and was found in rate controlled atrial flutter, new onset. His asa/plavix was stopped and he was placed on eliquis 5 mg bid. He was given prn metoprolol to use and was asked to f/u in the afib clinic. He has been followed by the [facility] cardiology clinic but is changing to [facility] soon.  Today, he has felt palpitations at times. Also shortness of breath for a few months. He is here with his daughter who lives with him. EKG shows aflutter with CVR. He has started on eliquis and is doing well on it.   Today, he denies symptoms of palpitations, chest pain, shortness of breath, orthopnea, PND, lower extremity edema, dizziness, presyncope, syncope, or neurologic sequela. The patient is tolerating medications without difficulties and is otherwise without complaint today.   Past Medical History:  Diagnosis Date  . Arthritis   . Blindness    due to glaucoma and cataract  . Cancer [facility]) 1998   Prostate. followed by urology  . CHF (congestive heart failure) ([place])   . CKD (chronic kidney disease), stage II   . Coronary artery disease    Had stent placed 2015???  . Dementia ([place])   . Diabetes mellitus ([place])   . GERD (gastroesophageal reflux disease)   . Hypercholesteremia   . Hypertension   . Shortness of breath dyspnea   . Stroke [facility]) 2016   a. on ASA/Plavix - felt 2/2 small vessel disease source per neuro notes.  . Vertigo    Past Surgical History:  Procedure Laterality Date  . CATARACT EXTRACTION  2011 and 2012   2011lt eye and rt eye 2012  . CORONARY ANGIOPLASTY WITH STENT PLACEMENT  2015???  . glauma implant  2012   rt.eye  . PROSTATECTOMY  1998    Current Outpatient Medications  Medication Sig Dispense  Refill  . acetaminophen (TYLENOL) 325 MG tablet Take 2 tablets (650 mg total) by mouth every 6 (six) hours as needed for mild pain (or Fever >/= 101).    Marland Kitchen allopurinol (ZYLOPRIM) 100 MG tablet Take 200 mg by mouth daily.     Marland Kitchen amLODipine (NORVASC) 5 MG tablet Take 1 tablet (5 mg total) by mouth daily. 90 tablet 3  . apixaban (ELIQUIS) 5 MG TABS tablet Take 1 tablet (5 mg total) by mouth 2 (two) times daily. 60 tablet 0  . atorvastatin (LIPITOR) 40 MG tablet Take 40 mg by mouth at bedtime.     . brimonidine (ALPHAGAN P) 0.1 % SOLN     . Calcium Carbonate-Vitamin D (CALCIUM 600+D) 600-400 MG-UNIT per tablet Take 1 tablet by mouth 2 (two) times daily.    . cholecalciferol (VITAMIN D) 1000 UNITS tablet Take 1,000 Units by mouth daily.    . dorzolamide-timolol (COSOPT) 22.3-6.8 MG/ML ophthalmic solution Place 2 drops into both eyes Twice daily.    . metoprolol tartrate (LOPRESSOR) 25 MG tablet Take 1 tablet (25 mg total) by mouth as needed. 30 tablet 0  . polyvinyl alcohol (LIQUIFILM TEARS) 1.4 % ophthalmic solution Place 1 drop into both eyes as needed for dry eyes.     . prednisoLONE acetate (PRED FORTE) 1 % ophthalmic suspension Place 1 drop into both eyes  daily.     . tamsulosin (FLOMAX) 0.4 MG CAPS capsule Take 0.4 mg by mouth at bedtime.     No current facility-administered medications for this encounter.     No Known Allergies  Social History   Socioeconomic History  . Marital status: Married    Spouse name: Not on file  . Number of children: Not on file  . Years of education: Not on file  . Highest education level: Not on file  Occupational History  . Not on file  Social Needs  . Financial resource strain: Not on file  . Food insecurity    Worry: Not on file    Inability: Not on file  . Transportation needs    Medical: Not on file    Non-medical: Not on file  Tobacco Use  . Smoking status: Never Smoker  . Smokeless tobacco: Never Used  Substance and Sexual Activity  .  Alcohol use: Yes    Alcohol/week: 2.0 standard drinks    Types: 2 Shots of liquor per week    Comment: rare  . Drug use: No  . Sexual activity: Not Currently  Lifestyle  . Physical activity    Days per week: Not on file    Minutes per session: Not on file  . Stress: Not on file  Relationships  . Social Herbalist on phone: Not on file    Gets together: Not on file    Attends religious service: Not on file    Active member of club or organization: Not on file    Attends meetings of clubs or organizations: Not on file    Relationship status: Not on file  . Intimate partner violence    Fear of current or ex partner: Not on file    Emotionally abused: Not on file    Physically abused: Not on file    Forced sexual activity: Not on file  Other Topics Concern  . Not on file  Social History Narrative   Retired English as a second language teacher   Lives with daughter    Family History  Problem Relation Age of Onset  . Premature CHD Neg Hx     ROS- All systems are reviewed and negative except as per the HPI above  Physical Exam: Vitals:   12/09/18 1438  BP: (!) 150/92  Pulse: 75  Weight: 98.4 kg  Height: 6\' 1"  (1.854 m)   Wt Readings from Last 3 Encounters:  12/09/18 98.4 kg  09/05/18 97.5 kg  04/21/18 99 kg    Labs: Lab Results  Component Value Date   NA 138 11/27/2018   K 3.7 11/27/2018   CL 105 11/27/2018   CO2 25 11/27/2018   GLUCOSE 142 (H) 11/27/2018   BUN 11 11/27/2018   CREATININE 1.19 11/27/2018   CALCIUM 9.2 11/27/2018   MG 2.1 11/27/2018   Lab Results  Component Value Date   INR 1.06 10/17/2014   Lab Results  Component Value Date   CHOL 129 10/19/2014   HDL 37 (L) 10/19/2014   LDLCALC 65 10/19/2014   TRIG 133 10/19/2014     GEN- The patient is well appearing, alert and oriented x 3 today.   Head- normocephalic, atraumatic Eyes-  Sclera clear, conjunctiva pink Ears- hearing intact Oropharynx- clear Neck- supple, no JVP Lymph- no cervical  lymphadenopathy Lungs- Clear to ausculation bilaterally, normal work of breathing Heart- Regular rate and rhythm, no murmurs, rubs or gallops, PMI not laterally displaced GI- soft, NT, ND, + BS  Extremities- no clubbing, cyanosis, or edema MS- no significant deformity or atrophy Skin- no rash or lesion Psych- euthymic mood, full affect Neuro- strength and sensation are intact  EKG-appears to be atrial flutter at 75 bpm EPIC records reviewed   Assessment and Plan: 1.New onset atrial flutter with CVR General education re aflutter Continue metoprolol as needed Echo One week Zio patch   2. CHA2DS2VASc score of at least 7 Continue eliquis 5 mg bid  Asked not to miss any doses of anticoagulation as cardioversion may be in the future He is now off asa/plavix   F/u in 2-3 weeks   Trevor Andrews, Trevor Andrews [address] [place], [place] [address] [phone]

## 2018-12-17 ENCOUNTER — Ambulatory Visit (HOSPITAL_COMMUNITY)
Admission: RE | Admit: 2018-12-17 | Discharge: 2018-12-17 | Disposition: A | Discharge status: Home / Self Care | Payer: Medicare Other | Source: Ambulatory Visit | Attending: Nurse Practitioner | Admitting: Nurse Practitioner

## 2018-12-17 ENCOUNTER — Other Ambulatory Visit: Payer: Self-pay

## 2018-12-17 DIAGNOSIS — I484 Atypical atrial flutter: Secondary | ICD-10-CM

## 2018-12-17 NOTE — Progress Notes (Signed)
  Echocardiogram 2D Echocardiogram has been performed.  Trevor Andrews 12/17/2018, 1:54 PM

## 2018-12-24 ENCOUNTER — Telehealth (HOSPITAL_COMMUNITY): Payer: Self-pay | Admitting: Nurse Practitioner

## 2018-12-24 ENCOUNTER — Ambulatory Visit (HOSPITAL_COMMUNITY): Payer: Medicare Other | Admitting: Nurse Practitioner

## 2018-12-24 NOTE — Telephone Encounter (Signed)
Reviewed with pt his zio patch monitor results. It showed continuous atrial flutter with  average rate of 107 bpm, he had 154 pauses with longest lasting 5.9 ms. He rarely will use a dose of BB.No  rate control daily. His echo showed an EF reduced at 40/45%. I cannot find any old echo reports in Care everywhere as he has had his cardiac care thru the [place]. He does have h/o chronically diastolic HF though. I will cancel f/u with me today and ask him to see Dr. Lovena Le to see if PPM is indicated.

## 2018-12-30 ENCOUNTER — Other Ambulatory Visit (HOSPITAL_COMMUNITY): Payer: Self-pay | Admitting: *Deleted

## 2018-12-30 ENCOUNTER — Other Ambulatory Visit (HOSPITAL_COMMUNITY): Payer: Self-pay | Admitting: Emergency Medicine

## 2018-12-30 MED ORDER — APIXABAN 5 MG PO TABS: 5.0000 mg | ORAL_TABLET | Freq: Two times a day (BID) | ORAL | 3 refills | Status: DC

## 2019-01-05 ENCOUNTER — Encounter: Payer: Self-pay | Admitting: Internal Medicine

## 2019-01-05 ENCOUNTER — Other Ambulatory Visit: Payer: Self-pay

## 2019-01-05 ENCOUNTER — Ambulatory Visit (INDEPENDENT_AMBULATORY_CARE_PROVIDER_SITE_OTHER): Payer: Medicare Other | Admitting: Internal Medicine

## 2019-01-05 DIAGNOSIS — I4892 Unspecified atrial flutter: Secondary | ICD-10-CM | POA: Insufficient documentation

## 2019-01-05 DIAGNOSIS — I442 Atrioventricular block, complete: Secondary | ICD-10-CM | POA: Insufficient documentation

## 2019-01-05 NOTE — Progress Notes (Signed)
HPI [name] is referred back by Trevor Andrews for evaluation of atrial flutter and pauses. He is a pleasant 85 yo man with atrial fib and flutter. He has had some dizzy spells. He has mild LV dysfunction by echo. He wore a cardiac monitor which demonstrated pauses during the daytime of over 5 seconds as well as atrial flutter with a RVR. He is referred now to consider additional treatment options. The patient denies syncope but his daughter who is with him notes that he will get very dizzy in his chair.  No Known Allergies   Current Outpatient Medications  Medication Sig Dispense Refill  . acetaminophen (TYLENOL) 325 MG tablet Take 2 tablets (650 mg total) by mouth every 6 (six) hours as needed for mild pain (or Fever >/= 101).    Marland Kitchen allopurinol (ZYLOPRIM) 100 MG tablet Take 200 mg by mouth daily.     Marland Kitchen amLODipine (NORVASC) 5 MG tablet Take 1 tablet (5 mg total) by mouth daily. 90 tablet 3  . apixaban (ELIQUIS) 5 MG TABS tablet Take 1 tablet (5 mg total) by mouth 2 (two) times daily. 60 tablet 3  . apixaban (ELIQUIS) 5 MG TABS tablet Take 1 tablet (5 mg total) by mouth 2 (two) times daily. 60 tablet 3  . atorvastatin (LIPITOR) 40 MG tablet Take 40 mg by mouth at bedtime.     . brimonidine (ALPHAGAN P) 0.1 % SOLN     . Calcium Carbonate-Vitamin D (CALCIUM 600+D) 600-400 MG-UNIT per tablet Take 1 tablet by mouth 2 (two) times daily.    . cholecalciferol (VITAMIN D) 1000 UNITS tablet Take 1,000 Units by mouth daily.    . dorzolamide-timolol (COSOPT) 22.3-6.8 MG/ML ophthalmic solution Place 2 drops into both eyes Twice daily.    . metoprolol tartrate (LOPRESSOR) 25 MG tablet Take 1 tablet (25 mg total) by mouth as needed. 30 tablet 0  . polyvinyl alcohol (LIQUIFILM TEARS) 1.4 % ophthalmic solution Place 1 drop into both eyes as needed for dry eyes.     . prednisoLONE acetate (PRED FORTE) 1 % ophthalmic suspension Place 1 drop into both eyes daily.     . tamsulosin (FLOMAX) 0.4 MG CAPS  capsule Take 0.4 mg by mouth at bedtime.     No current facility-administered medications for this visit.      Past Medical History:  Diagnosis Date  . Arthritis   . Blindness    due to glaucoma and cataract  . Cancer [facility]) 1998   Prostate. followed by urology  . CHF (congestive heart failure) ([place])   . CKD (chronic kidney disease), stage II   . Coronary artery disease    Had stent placed 2015???  . Dementia ([place])   . Diabetes mellitus ([place])   . GERD (gastroesophageal reflux disease)   . Hypercholesteremia   . Hypertension   . Shortness of breath dyspnea   . Stroke [facility]) 2016   a. on ASA/Plavix - felt 2/2 small vessel disease source per neuro notes.  . Vertigo     ROS:   All systems reviewed and negative except as noted in the HPI.   Past Surgical History:  Procedure Laterality Date  . CATARACT EXTRACTION  2011 and 2012   2011lt eye and rt eye 2012  . CORONARY ANGIOPLASTY WITH STENT PLACEMENT  2015???  . glauma implant  2012   rt.eye  . PROSTATECTOMY  1998     Family History  Problem Relation Age of Onset  .  Premature CHD Neg Hx      Social History   Socioeconomic History  . Marital status: Married    Spouse name: Not on file  . Number of children: Not on file  . Years of education: Not on file  . Highest education level: Not on file  Occupational History  . Not on file  Social Needs  . Financial resource strain: Not on file  . Food insecurity    Worry: Not on file    Inability: Not on file  . Transportation needs    Medical: Not on file    Non-medical: Not on file  Tobacco Use  . Smoking status: Never Smoker  . Smokeless tobacco: Never Used  Substance and Sexual Activity  . Alcohol use: Yes    Alcohol/week: 2.0 standard drinks    Types: 2 Shots of liquor per week    Comment: rare  . Drug use: No  . Sexual activity: Not Currently  Lifestyle  . Physical activity    Days per week: Not on file    Minutes per session: Not on file  .  Stress: Not on file  Relationships  . Social Herbalist on phone: Not on file    Gets together: Not on file    Attends religious service: Not on file    Active member of club or organization: Not on file    Attends meetings of clubs or organizations: Not on file    Relationship status: Not on file  . Intimate partner violence    Fear of current or ex partner: Not on file    Emotionally abused: Not on file    Physically abused: Not on file    Forced sexual activity: Not on file  Other Topics Concern  . Not on file  Social History Narrative   Retired [employer] with daughter     BP (!) 148/94   Pulse (!) 111   Ht 6\' 1"  (1.854 m)   Wt 215 lb (97.5 kg)   SpO2 99%   BMI 28.37 kg/m   Physical Exam:  Well appearing NAD HEENT: Unremarkable Neck:  No JVD, no thyromegally Lymphatics:  No adenopathy Back:  No CVA tenderness Lungs:  Clear with no wheezes HEART:  Regular rate rhythm, no murmurs, no rubs, no clicks Abd:  soft, positive bowel sounds, no organomegally, no rebound, no guarding Ext:  2 plus pulses, no edema, no cyanosis, no clubbing Skin:  No rashes no nodules Neuro:  CN II through XII intact, motor grossly intact  EKG - atrial flutter with 2:1 AV conduction.   Assess/Plan: 1. Atrial flutter - his VR today is fast but at times it is very slow. With CHB. I have discussed the treatment options with the patient and his daughter. We could consider catheter ablation but he is certain to have atrial fib with his advanced age. I have recommended PPM insertion 2. Heart block - he has transient CHB with a slow VR. I have reviewed the indications/risks/benefits/goals/expectations of PPM insertion with the patient and his daughter and they wish to proceed. 3. HTN - once his PPM is in place, I would anticipate uptitration of his beta blocker to assist with his bp and rate control.  Trevor Andrews.D.

## 2019-01-05 NOTE — Patient Instructions (Addendum)
Medication Instructions:  Your physician recommends that you continue on your current medications as directed. Please refer to the Current Medication list given to you today.  Labwork: You will get lab work today:  BMP and CBC  Testing/Procedures: Your physician has recommended that you have a pacemaker inserted. A pacemaker is a small device that is placed under the skin of your chest or abdomen to help control abnormal heart rhythms. This device uses electrical pulses to prompt the heart to beat at a normal rate. Pacemakers are used to treat heart rhythms that are too slow. Wire (leads) are attached to the pacemaker that goes into the chambers of you heart. This is done in the hospital and usually requires and overnight stay. Please see the instruction sheet given to you today for more information.   Follow-Up:   SEE instruction letter   Any Other Special Instructions Will Be Listed Below (If Applicable).  If you need a refill on your cardiac medications before your next appointment, please call your pharmacy.    Pacemaker Implantation, Adult Pacemaker implantation is a procedure to place a pacemaker inside your chest. A pacemaker is a small computer that sends electrical signals to the heart and helps your heart beat normally. A pacemaker also stores information about your heart rhythms. You may need pacemaker implantation if you:  Have a slow heartbeat (bradycardia).  Faint (syncope).  Have shortness of breath (dyspnea) due to heart problems. The pacemaker attaches to your heart through a wire, called a lead. Sometimes just one lead is needed. Other times, there will be two leads. There are two types of pacemakers:  Transvenous pacemaker. This type is placed under the skin or muscle of your chest. The lead goes through a vein in the chest area to reach the inside of the heart.  Epicardial pacemaker. This type is placed under the skin or muscle of your chest or belly. The lead  goes through your chest to the outside of the heart. Tell a health care provider about:  Any allergies you have.  All medicines you are taking, including vitamins, herbs, eye drops, creams, and over-the-counter medicines.  Any problems you or family members have had with anesthetic medicines.  Any blood or bone disorders you have.  Any surgeries you have had.  Any medical conditions you have.  Whether you are pregnant or may be pregnant. What are the risks? Generally, this is a safe procedure. However, problems may occur, including:  Infection.  Bleeding.  Failure of the pacemaker or the lead.  Collapse of a lung or bleeding into a lung.  Blood clot inside a blood vessel with a lead.  Damage to the heart.  Infection inside the heart (endocarditis).  Allergic reactions to medicines. What happens before the procedure? Staying hydrated Follow instructions from your health care provider about hydration, which may include:  Up to 2 hours before the procedure - you may continue to drink clear liquids, such as water, clear fruit juice, black coffee, and plain tea. Eating and drinking restrictions Follow instructions from your health care provider about eating and drinking, which may include:  8 hours before the procedure - stop eating heavy meals or foods such as meat, fried foods, or fatty foods.  6 hours before the procedure - stop eating light meals or foods, such as toast or cereal.  6 hours before the procedure - stop drinking milk or drinks that contain milk.  2 hours before the procedure - stop drinking clear liquids.  Medicines  Ask your health care provider about: ? Changing or stopping your regular medicines. This is especially important if you are taking diabetes medicines or blood thinners. ? Taking medicines such as aspirin and ibuprofen. These medicines can thin your blood. Do not take these medicines before your procedure if your health care provider  instructs you not to.  You may be given antibiotic medicine to help prevent infection. General instructions  You will have a heart evaluation. This may include an electrocardiogram (ECG), chest X-ray, and heart imaging (echocardiogram,  or echo) tests.  You will have blood tests.  Do not use any products that contain nicotine or tobacco, such as cigarettes and e-cigarettes. If you need help quitting, ask your health care provider.  Plan to have someone take you home from the hospital or clinic.  If you will be going home right after the procedure, plan to have someone with you for 24 hours.  Ask your health care provider how your surgical site will be marked or identified. What happens during the procedure?  To reduce your risk of infection: ? Your health care team will wash or sanitize their hands. ? Your skin will be washed with soap. ? Hair may be removed from the surgical area.  An IV tube will be inserted into one of your veins.  You will be given one or more of the following: ? A medicine to help you relax (sedative). ? A medicine to numb the area (local anesthetic). ? A medicine to make you fall asleep (general anesthetic).  If you are getting a transvenous pacemaker: ? An incision will be made in your upper chest. ? A pocket will be made for the pacemaker. It may be placed under the skin or between layers of muscle. ? The lead will be inserted into a blood vessel that returns to the heart. ? While X-rays are taken by an imaging machine (fluoroscopy), the lead will be advanced through the vein to the inside of your heart. ? The other end of the lead will be tunneled under the skin and attached to the pacemaker.  If you are getting an epicardial pacemaker: ? An incision will be made near your ribs or breastbone (sternum) for the lead. ? The lead will be attached to the outside of your heart. ? Another incision will be made in your chest or upper belly to create a pocket  for the pacemaker. ? The free end of the lead will be tunneled under the skin and attached to the pacemaker.  The transvenous or epicardial pacemaker will be tested. Imaging studies may be done to check the lead position.  The incisions will be closed with stitches (sutures), adhesive strips, or skin glue.  Bandages (dressing) will be placed over the incisions. The procedure may vary among health care providers and hospitals. What happens after the procedure?  Your blood pressure, heart rate, breathing rate, and blood oxygen level will be monitored until the medicines you were given have worn off.  You will be given antibiotics and pain medicine.  ECG and chest x-rays will be done.  You will wear a continuous type of ECG (Holter monitor) to check your heart rhythm.  Your health care provider will program the pacemaker.  Do not drive for 24 hours if you received a sedative. This information is not intended to replace advice given to you by your health care provider. Make sure you discuss any questions you have with your health care provider. Document  Released: 02/08/2002 Document Revised: 11/07/2017 Document Reviewed: 08/02/2015 Elsevier Patient Education  2020 [employer].

## 2019-01-05 NOTE — H&P (View-Only) (Signed)
HPI Trevor Andrews is referred back by Trevor Andrews for evaluation of atrial flutter and pauses. He is a pleasant 85 yo man with atrial fib and flutter. He has had some dizzy spells. He has mild LV dysfunction by echo. He wore a cardiac monitor which demonstrated pauses during the daytime of over 5 seconds as well as atrial flutter with a RVR. He is referred now to consider additional treatment options. The patient denies syncope but his daughter who is with him notes that he will get very dizzy in his chair.  No Known Allergies   Current Outpatient Medications  Medication Sig Dispense Refill  . acetaminophen (TYLENOL) 325 MG tablet Take 2 tablets (650 mg total) by mouth every 6 (six) hours as needed for mild pain (or Fever >/= 101).    Marland Kitchen allopurinol (ZYLOPRIM) 100 MG tablet Take 200 mg by mouth daily.     Marland Kitchen amLODipine (NORVASC) 5 MG tablet Take 1 tablet (5 mg total) by mouth daily. 90 tablet 3  . apixaban (ELIQUIS) 5 MG TABS tablet Take 1 tablet (5 mg total) by mouth 2 (two) times daily. 60 tablet 3  . apixaban (ELIQUIS) 5 MG TABS tablet Take 1 tablet (5 mg total) by mouth 2 (two) times daily. 60 tablet 3  . atorvastatin (LIPITOR) 40 MG tablet Take 40 mg by mouth at bedtime.     . brimonidine (ALPHAGAN P) 0.1 % SOLN     . Calcium Carbonate-Vitamin D (CALCIUM 600+D) 600-400 MG-UNIT per tablet Take 1 tablet by mouth 2 (two) times daily.    . cholecalciferol (VITAMIN D) 1000 UNITS tablet Take 1,000 Units by mouth daily.    . dorzolamide-timolol (COSOPT) 22.3-6.8 MG/ML ophthalmic solution Place 2 drops into both eyes Twice daily.    . metoprolol tartrate (LOPRESSOR) 25 MG tablet Take 1 tablet (25 mg total) by mouth as needed. 30 tablet 0  . polyvinyl alcohol (LIQUIFILM TEARS) 1.4 % ophthalmic solution Place 1 drop into both eyes as needed for dry eyes.     . prednisoLONE acetate (PRED FORTE) 1 % ophthalmic suspension Place 1 drop into both eyes daily.     . tamsulosin (FLOMAX) 0.4 MG CAPS  capsule Take 0.4 mg by mouth at bedtime.     No current facility-administered medications for this visit.      Past Medical History:  Diagnosis Date  . Arthritis   . Blindness    due to glaucoma and cataract  . Cancer [facility]) 1998   Prostate. followed by urology  . CHF (congestive heart failure) ([place])   . CKD (chronic kidney disease), stage II   . Coronary artery disease    Had stent placed 2015???  . Dementia ([place])   . Diabetes mellitus ([place])   . GERD (gastroesophageal reflux disease)   . Hypercholesteremia   . Hypertension   . Shortness of breath dyspnea   . Stroke [facility]) 2016   a. on ASA/Plavix - felt 2/2 small vessel disease source per neuro notes.  . Vertigo     ROS:   All systems reviewed and negative except as noted in the HPI.   Past Surgical History:  Procedure Laterality Date  . CATARACT EXTRACTION  2011 and 2012   2011lt eye and rt eye 2012  . CORONARY ANGIOPLASTY WITH STENT PLACEMENT  2015???  . glauma implant  2012   rt.eye  . PROSTATECTOMY  1998     Family History  Problem Relation Age of Onset  .  Premature CHD Neg Hx      Social History   Socioeconomic History  . Marital status: Married    Spouse name: Not on file  . Number of children: Not on file  . Years of education: Not on file  . Highest education level: Not on file  Occupational History  . Not on file  Social Needs  . Financial resource strain: Not on file  . Food insecurity    Worry: Not on file    Inability: Not on file  . Transportation needs    Medical: Not on file    Non-medical: Not on file  Tobacco Use  . Smoking status: Never Smoker  . Smokeless tobacco: Never Used  Substance and Sexual Activity  . Alcohol use: Yes    Alcohol/week: 2.0 standard drinks    Types: 2 Shots of liquor per week    Comment: rare  . Drug use: No  . Sexual activity: Not Currently  Lifestyle  . Physical activity    Days per week: Not on file    Minutes per session: Not on file  .  Stress: Not on file  Relationships  . Social Herbalist on phone: Not on file    Gets together: Not on file    Attends religious service: Not on file    Active member of club or organization: Not on file    Attends meetings of clubs or organizations: Not on file    Relationship status: Not on file  . Intimate partner violence    Fear of current or ex partner: Not on file    Emotionally abused: Not on file    Physically abused: Not on file    Forced sexual activity: Not on file  Other Topics Concern  . Not on file  Social History Narrative   Retired [employer] with daughter     BP (!) 148/94   Pulse (!) 111   Ht 6\' 1"  (1.854 m)   Wt 215 lb (97.5 kg)   SpO2 99%   BMI 28.37 kg/m   Physical Exam:  Well appearing NAD HEENT: Unremarkable Neck:  No JVD, no thyromegally Lymphatics:  No adenopathy Back:  No CVA tenderness Lungs:  Clear with no wheezes HEART:  Regular rate rhythm, no murmurs, no rubs, no clicks Abd:  soft, positive bowel sounds, no organomegally, no rebound, no guarding Ext:  2 plus pulses, no edema, no cyanosis, no clubbing Skin:  No rashes no nodules Neuro:  CN II through XII intact, motor grossly intact  EKG - atrial flutter with 2:1 AV conduction.   Assess/Plan: 1. Atrial flutter - his VR today is fast but at times it is very slow. With CHB. I have discussed the treatment options with the patient and his daughter. We could consider catheter ablation but he is certain to have atrial fib with his advanced age. I have recommended PPM insertion 2. Heart block - he has transient CHB with a slow VR. I have reviewed the indications/risks/benefits/goals/expectations of PPM insertion with the patient and his daughter and they wish to proceed. 3. HTN - once his PPM is in place, I would anticipate uptitration of his beta blocker to assist with his bp and rate control.  Mikle Bosworth.D.

## 2019-01-06 LAB — CBC WITH DIFFERENTIAL/PLATELET
Basophils Absolute: 0 10*3/uL (ref 0.0–0.2)
Basos: 1 %
EOS (ABSOLUTE): 0.1 10*3/uL (ref 0.0–0.4)
Eos: 3 %
Hematocrit: 43 % (ref 37.5–51.0)
Hemoglobin: 14.8 g/dL (ref 13.0–17.7)
Immature Grans (Abs): 0 10*3/uL (ref 0.0–0.1)
Immature Granulocytes: 0 %
Lymphocytes Absolute: 1.6 10*3/uL (ref 0.7–3.1)
Lymphs: 37 %
MCH: 29.4 pg (ref 26.6–33.0)
MCHC: 34.4 g/dL (ref 31.5–35.7)
MCV: 86 fL (ref 79–97)
Monocytes Absolute: 0.5 10*3/uL (ref 0.1–0.9)
Monocytes: 13 %
Neutrophils Absolute: 2 10*3/uL (ref 1.4–7.0)
Neutrophils: 46 %
Platelets: 216 10*3/uL (ref 150–450)
RBC: 5.03 x10E6/uL (ref 4.14–5.80)
RDW: 14.8 % (ref 11.6–15.4)
WBC: 4.2 10*3/uL (ref 3.4–10.8)

## 2019-01-06 LAB — BASIC METABOLIC PANEL
BUN/Creatinine Ratio: 11 (ref 10–24)
BUN: 13 mg/dL (ref 8–27)
CO2: 26 mmol/L (ref 20–29)
Calcium: 9.3 mg/dL (ref 8.6–10.2)
Chloride: 106 mmol/L (ref 96–106)
Creatinine, Ser: 1.18 mg/dL (ref 0.76–1.27)
GFR calc Af Amer: 64 mL/min/{1.73_m2} (ref >59)
GFR calc non Af Amer: 55 mL/min/{1.73_m2} — ABNORMAL LOW (ref >59)
Glucose: 84 mg/dL (ref 65–99)
Potassium: 4.1 mmol/L (ref 3.5–5.2)
Sodium: 142 mmol/L (ref 134–144)

## 2019-01-15 ENCOUNTER — Other Ambulatory Visit (HOSPITAL_COMMUNITY)
Admission: RE | Admit: 2019-01-15 | Discharge: 2019-01-15 | Disposition: A | Discharge status: Home / Self Care | Payer: Medicare Other | Source: Ambulatory Visit | Attending: Internal Medicine | Admitting: Internal Medicine

## 2019-01-15 DIAGNOSIS — Z20828 Contact with and (suspected) exposure to other viral communicable diseases: Secondary | ICD-10-CM | POA: Diagnosis not present

## 2019-01-15 DIAGNOSIS — Z01812 Encounter for preprocedural laboratory examination: Secondary | ICD-10-CM | POA: Diagnosis present

## 2019-01-16 LAB — SARS CORONAVIRUS 2 (TAT 6-24 HRS): SARS Coronavirus 2: NEGATIVE

## 2019-01-18 ENCOUNTER — Other Ambulatory Visit: Payer: Self-pay

## 2019-01-18 ENCOUNTER — Ambulatory Visit (HOSPITAL_COMMUNITY)
Admission: RE | Admit: 2019-01-18 | Discharge: 2019-01-19 | Disposition: A | Discharge status: Home / Self Care | Payer: Medicare Other | Attending: Internal Medicine | Admitting: Internal Medicine

## 2019-01-18 ENCOUNTER — Telehealth: Payer: Self-pay

## 2019-01-18 ENCOUNTER — Ambulatory Visit (HOSPITAL_COMMUNITY)
Admission: RE | Disposition: A | Discharge status: Home / Self Care | Payer: Self-pay | Source: Home / Self Care | Attending: Internal Medicine

## 2019-01-18 DIAGNOSIS — K219 Gastro-esophageal reflux disease without esophagitis: Secondary | ICD-10-CM | POA: Diagnosis not present

## 2019-01-18 DIAGNOSIS — Z79899 Other long term (current) drug therapy: Secondary | ICD-10-CM | POA: Diagnosis not present

## 2019-01-18 DIAGNOSIS — I130 Hypertensive heart and chronic kidney disease with heart failure and stage 1 through stage 4 chronic kidney disease, or unspecified chronic kidney disease: Secondary | ICD-10-CM | POA: Insufficient documentation

## 2019-01-18 DIAGNOSIS — I5022 Chronic systolic (congestive) heart failure: Secondary | ICD-10-CM | POA: Insufficient documentation

## 2019-01-18 DIAGNOSIS — Z955 Presence of coronary angioplasty implant and graft: Secondary | ICD-10-CM | POA: Diagnosis not present

## 2019-01-18 DIAGNOSIS — Z7901 Long term (current) use of anticoagulants: Secondary | ICD-10-CM | POA: Insufficient documentation

## 2019-01-18 DIAGNOSIS — I442 Atrioventricular block, complete: Secondary | ICD-10-CM | POA: Diagnosis present

## 2019-01-18 DIAGNOSIS — E7800 Pure hypercholesterolemia, unspecified: Secondary | ICD-10-CM | POA: Insufficient documentation

## 2019-01-18 DIAGNOSIS — N182 Chronic kidney disease, stage 2 (mild): Secondary | ICD-10-CM | POA: Insufficient documentation

## 2019-01-18 DIAGNOSIS — I4892 Unspecified atrial flutter: Secondary | ICD-10-CM | POA: Diagnosis not present

## 2019-01-18 DIAGNOSIS — Z950 Presence of cardiac pacemaker: Secondary | ICD-10-CM | POA: Diagnosis not present

## 2019-01-18 DIAGNOSIS — R001 Bradycardia, unspecified: Secondary | ICD-10-CM | POA: Insufficient documentation

## 2019-01-18 DIAGNOSIS — Z8673 Personal history of transient ischemic attack (TIA), and cerebral infarction without residual deficits: Secondary | ICD-10-CM | POA: Diagnosis not present

## 2019-01-18 DIAGNOSIS — E1151 Type 2 diabetes mellitus with diabetic peripheral angiopathy without gangrene: Secondary | ICD-10-CM | POA: Diagnosis not present

## 2019-01-18 DIAGNOSIS — E1122 Type 2 diabetes mellitus with diabetic chronic kidney disease: Secondary | ICD-10-CM | POA: Insufficient documentation

## 2019-01-18 DIAGNOSIS — E1136 Type 2 diabetes mellitus with diabetic cataract: Secondary | ICD-10-CM | POA: Insufficient documentation

## 2019-01-18 DIAGNOSIS — M1990 Unspecified osteoarthritis, unspecified site: Secondary | ICD-10-CM | POA: Insufficient documentation

## 2019-01-18 DIAGNOSIS — H548 Legal blindness, as defined in USA: Secondary | ICD-10-CM | POA: Diagnosis not present

## 2019-01-18 DIAGNOSIS — F0390 Unspecified dementia without behavioral disturbance: Secondary | ICD-10-CM | POA: Diagnosis not present

## 2019-01-18 DIAGNOSIS — I2510 Atherosclerotic heart disease of native coronary artery without angina pectoris: Secondary | ICD-10-CM | POA: Insufficient documentation

## 2019-01-18 HISTORY — PX: PACEMAKER IMPLANT: EP1218

## 2019-01-18 LAB — GLUCOSE, CAPILLARY: Glucose-Capillary: 91 mg/dL (ref 70–99)

## 2019-01-18 LAB — SURGICAL PCR SCREEN
MRSA, PCR: NEGATIVE
Staphylococcus aureus: NEGATIVE

## 2019-01-18 SURGERY — PACEMAKER IMPLANT
Anesthesia: LOCAL

## 2019-01-18 MED ORDER — HYDRALAZINE HCL 20 MG/ML IJ SOLN
INTRAMUSCULAR | Status: DC | PRN
  Administered 2019-01-18: 5 mg via INTRAVENOUS

## 2019-01-18 MED ORDER — SODIUM CHLORIDE 0.9 % IV SOLN
INTRAVENOUS | Status: DC
  Administered 2019-01-18: 11:00:00 via INTRAVENOUS

## 2019-01-18 MED ORDER — SODIUM CHLORIDE 0.9 % IV SOLN
80.0000 mg | INTRAVENOUS | Status: AC
  Administered 2019-01-18: 80 mg

## 2019-01-18 MED ORDER — AMLODIPINE BESYLATE 5 MG PO TABS
5.0000 mg | ORAL_TABLET | Freq: Every day | ORAL | Status: DC
  Administered 2019-01-18 – 2019-01-19 (×2): 5 mg via ORAL
  Filled 2019-01-18 (×2): qty 1

## 2019-01-18 MED ORDER — METOPROLOL TARTRATE 25 MG PO TABS
25.0000 mg | ORAL_TABLET | Freq: Two times a day (BID) | ORAL | Status: DC
  Administered 2019-01-18 – 2019-01-19 (×2): 25 mg via ORAL
  Filled 2019-01-18 (×2): qty 1

## 2019-01-18 MED ORDER — PREDNISOLONE ACETATE 1 % OP SUSP
1.0000 [drp] | Freq: Every day | OPHTHALMIC | Status: DC
  Administered 2019-01-19: 1 [drp] via OPHTHALMIC
  Filled 2019-01-18: qty 5

## 2019-01-18 MED ORDER — LIDOCAINE HCL 1 % IJ SOLN
INTRAMUSCULAR | Status: AC
  Filled 2019-01-18: qty 60

## 2019-01-18 MED ORDER — VITAMIN D 25 MCG (1000 UNIT) PO TABS
1000.0000 [IU] | ORAL_TABLET | Freq: Every day | ORAL | Status: DC
  Administered 2019-01-18 – 2019-01-19 (×2): 1000 [IU] via ORAL
  Filled 2019-01-18 (×4): qty 1

## 2019-01-18 MED ORDER — MUPIROCIN 2 % EX OINT
TOPICAL_OINTMENT | CUTANEOUS | Status: AC
  Administered 2019-01-18: 11:00:00
  Filled 2019-01-18: qty 22

## 2019-01-18 MED ORDER — IOHEXOL 350 MG/ML SOLN
INTRAVENOUS | Status: DC | PRN
  Administered 2019-01-18: 16:00:00 15 mL

## 2019-01-18 MED ORDER — ACETAMINOPHEN 325 MG PO TABS: 325.0000 mg | ORAL_TABLET | ORAL | Status: DC | PRN

## 2019-01-18 MED ORDER — POLYVINYL ALCOHOL 1.4 % OP SOLN
1.0000 [drp] | OPHTHALMIC | Status: DC | PRN
  Filled 2019-01-18: qty 15

## 2019-01-18 MED ORDER — HEPARIN (PORCINE) IN NACL 1000-0.9 UT/500ML-% IV SOLN
INTRAVENOUS | Status: DC | PRN
  Administered 2019-01-18: 500 mL

## 2019-01-18 MED ORDER — SODIUM CHLORIDE 0.9 % IV SOLN
INTRAVENOUS | Status: AC
  Filled 2019-01-18: qty 2

## 2019-01-18 MED ORDER — CALCIUM CARBONATE-VITAMIN D 500-200 MG-UNIT PO TABS
1.0000 | ORAL_TABLET | Freq: Every day | ORAL | Status: DC
  Administered 2019-01-18 – 2019-01-19 (×2): 1 via ORAL
  Filled 2019-01-18 (×2): qty 1

## 2019-01-18 MED ORDER — TAMSULOSIN HCL 0.4 MG PO CAPS
0.4000 mg | ORAL_CAPSULE | Freq: Every day | ORAL | Status: DC
  Administered 2019-01-18: 0.4 mg via ORAL
  Filled 2019-01-18: qty 1

## 2019-01-18 MED ORDER — MIDAZOLAM HCL 5 MG/5ML IJ SOLN
INTRAMUSCULAR | Status: DC | PRN
  Administered 2019-01-18 (×2): 1 mg via INTRAVENOUS

## 2019-01-18 MED ORDER — FENTANYL CITRATE (PF) 100 MCG/2ML IJ SOLN
INTRAMUSCULAR | Status: AC
  Filled 2019-01-18: qty 2

## 2019-01-18 MED ORDER — CEFAZOLIN SODIUM-DEXTROSE 2-4 GM/100ML-% IV SOLN
2.0000 g | INTRAVENOUS | Status: AC
  Administered 2019-01-18: 15:00:00 2 g via INTRAVENOUS

## 2019-01-18 MED ORDER — MIDAZOLAM HCL 5 MG/5ML IJ SOLN
INTRAMUSCULAR | Status: AC
  Filled 2019-01-18: qty 5

## 2019-01-18 MED ORDER — CHLORHEXIDINE GLUCONATE 4 % EX LIQD: 4.0000 "application " | Freq: Once | CUTANEOUS | Status: DC

## 2019-01-18 MED ORDER — ONDANSETRON HCL 4 MG/2ML IJ SOLN: 4.0000 mg | Freq: Four times a day (QID) | INTRAMUSCULAR | Status: DC | PRN

## 2019-01-18 MED ORDER — CEFAZOLIN SODIUM-DEXTROSE 1-4 GM/50ML-% IV SOLN
1.0000 g | Freq: Four times a day (QID) | INTRAVENOUS | Status: AC
  Administered 2019-01-18 – 2019-01-19 (×3): 1 g via INTRAVENOUS
  Filled 2019-01-18 (×4): qty 50

## 2019-01-18 MED ORDER — LIDOCAINE HCL (PF) 1 % IJ SOLN
INTRAMUSCULAR | Status: DC | PRN
  Administered 2019-01-18: 40 mL

## 2019-01-18 MED ORDER — HYDRALAZINE HCL 20 MG/ML IJ SOLN
INTRAMUSCULAR | Status: AC
  Filled 2019-01-18: qty 1

## 2019-01-18 MED ORDER — FENTANYL CITRATE (PF) 100 MCG/2ML IJ SOLN
INTRAMUSCULAR | Status: DC | PRN
  Administered 2019-01-18 (×2): 12.5 ug via INTRAVENOUS

## 2019-01-18 MED ORDER — CEFAZOLIN SODIUM-DEXTROSE 2-4 GM/100ML-% IV SOLN
INTRAVENOUS | Status: AC
  Filled 2019-01-18: qty 100

## 2019-01-18 MED ORDER — ALLOPURINOL 100 MG PO TABS
200.0000 mg | ORAL_TABLET | Freq: Every day | ORAL | Status: DC
  Administered 2019-01-18 – 2019-01-19 (×2): 200 mg via ORAL
  Filled 2019-01-18 (×2): qty 2

## 2019-01-18 MED ORDER — ATORVASTATIN CALCIUM 40 MG PO TABS
40.0000 mg | ORAL_TABLET | Freq: Every day | ORAL | Status: DC
  Administered 2019-01-18: 40 mg via ORAL
  Filled 2019-01-18: qty 1

## 2019-01-18 SURGICAL SUPPLY — 7 items
CABLE SURGICAL S-101-97-12 (CABLE) ×2 IMPLANT
LEAD TENDRIL MRI 52CM LPA1200M (Lead) ×2 IMPLANT
LEAD TENDRIL MRI 58CM LPA1200M (Lead) ×2 IMPLANT
PACEMAKER ASSURITY DR-RF (Pacemaker) ×2 IMPLANT
PAD PRO RADIOLUCENT 2001M-C (PAD) ×2 IMPLANT
SHEATH 8FR PRELUDE SNAP 13 (SHEATH) ×4 IMPLANT
TRAY PACEMAKER INSERTION (PACKS) ×2 IMPLANT

## 2019-01-18 NOTE — Progress Notes (Signed)
Pt states he drank 4 oz of pineapple juice this am 0830. Rennis Harding, Rn informed.

## 2019-01-18 NOTE — Plan of Care (Signed)
  Problem: Education: Goal: Knowledge of General Education information will improve Description Including pain rating scale, medication(s)/side effects and non-pharmacologic comfort measures Outcome: Progressing   

## 2019-01-18 NOTE — Telephone Encounter (Signed)
Call placed to Pt.  Rescheduled ppm for 01/25/2019 at 7:30 am.  Pt will need repeat covid test.  Instructions given to Pt and resent via mychart.  Advised that Dr. Lovena Le will do his procedure next Monday.

## 2019-01-18 NOTE — Discharge Instructions (Signed)
° ° °  Supplemental Discharge Instructions for  °Pacemaker/Defibrillator Patients ° °Activity °No heavy lifting or vigorous activity with your left/right arm for 6 to 8 weeks.  Do not raise your left/right arm above your head for one week.  Gradually raise your affected arm as drawn below. ° °        ° °__       11/20                     11/21                       11/22                          11/23 ° °NO DRIVING for   1 week  ; you may begin driving on   11/23  . ° °WOUND CARE °- Keep the wound area clean and dry.  Do not get this area wet for one week. No showers for one week; you may shower on  11/23   . °- The tape/steri-strips on your wound will fall off; do not pull them off.  No bandage is needed on the site.  DO  NOT apply any creams, oils, or ointments to the wound area. °- If you notice any drainage or discharge from the wound, any swelling or bruising at the site, or you develop a fever > 101? F after you are discharged home, call the office at once. ° °Special Instructions °- You are still able to use cellular telephones; use the ear opposite the side where you have your pacemaker/defibrillator.  Avoid carrying your cellular phone near your device. °- When traveling through airports, show security personnel your identification card to avoid being screened in the metal detectors.  Ask the security personnel to use the hand wand. °- Avoid arc welding equipment, MRI testing (magnetic resonance imaging), TENS units (transcutaneous nerve stimulators).  Call the office for questions about other devices. °- Avoid electrical appliances that are in poor condition or are not properly grounded. °- Microwave ovens are safe to be near or to operate. °  °

## 2019-01-18 NOTE — Interval H&P Note (Signed)
History and Physical Interval Note:  01/18/2019 1:37 PM  Trevor Andrews.  has presented today for surgery, with the diagnosis of Bradycardia.  The various methods of treatment have been discussed with the patient and family. After consideration of risks, benefits and other options for treatment, the patient has consented to  Procedure(s): PACEMAKER IMPLANT (N/A) as a surgical intervention.  The patient's history has been reviewed, patient examined, no change in status, stable for surgery.  I have reviewed the patient's chart and labs.  Questions were answered to the patient's satisfaction.     Cristopher Peru

## 2019-01-19 ENCOUNTER — Ambulatory Visit (HOSPITAL_COMMUNITY): Payer: Medicare Other

## 2019-01-19 ENCOUNTER — Encounter (HOSPITAL_COMMUNITY): Payer: Self-pay | Admitting: Internal Medicine

## 2019-01-19 DIAGNOSIS — I484 Atypical atrial flutter: Secondary | ICD-10-CM

## 2019-01-19 DIAGNOSIS — I442 Atrioventricular block, complete: Secondary | ICD-10-CM | POA: Diagnosis not present

## 2019-01-19 DIAGNOSIS — E1122 Type 2 diabetes mellitus with diabetic chronic kidney disease: Secondary | ICD-10-CM | POA: Diagnosis not present

## 2019-01-19 DIAGNOSIS — R001 Bradycardia, unspecified: Secondary | ICD-10-CM | POA: Diagnosis not present

## 2019-01-19 DIAGNOSIS — E1136 Type 2 diabetes mellitus with diabetic cataract: Secondary | ICD-10-CM | POA: Diagnosis not present

## 2019-01-19 LAB — GLUCOSE, CAPILLARY: Glucose-Capillary: 133 mg/dL — ABNORMAL HIGH (ref 70–99)

## 2019-01-19 MED ORDER — APIXABAN 5 MG PO TABS: 5.0000 mg | ORAL_TABLET | Freq: Two times a day (BID) | ORAL | 3 refills | Status: AC

## 2019-01-19 MED FILL — Lidocaine HCl Local Inj 1%: INTRAMUSCULAR | Qty: 60 | Status: AC

## 2019-01-19 NOTE — TOC Transition Note (Signed)
Transition of Care [facility]) - CM/SW Discharge Note   Patient Details  Name: Trevor Andrews. MRN: [id number] Date of Birth: 1929-09-12  Transition of Care [facility]) CM/SW Contact:  Pollie Friar, RN Phone Number: 01/19/2019, 11:47 AM   Clinical Narrative:    Pt discharging home with self care. Pt has transportation home.    Final next level of care: Home/Self Care Barriers to Discharge: No Barriers Identified   Patient Goals and CMS Choice        Discharge Placement                       Discharge Plan and Services                                     Social Determinants of Health (SDOH) Interventions     Readmission Risk Interventions No flowsheet data found.

## 2019-01-19 NOTE — Discharge Summary (Addendum)
ELECTROPHYSIOLOGY PROCEDURE DISCHARGE SUMMARY    Patient ID: Trevor Schlageter.,  MRN: [id number], DOB/AGE: 1929/11/21 85 y.o.  Admit date: 01/18/2019 Discharge date: 01/19/2019  Primary Care Physician: Wilber Oliphant, MD Electrophysiologist: Lovena Le  Primary Discharge Diagnosis:  Symptomatic bradycardia status post pacemaker implantation this admission  Secondary Discharge Diagnosis:  1.  Legal blindness 2.  HTN 3.  Prior CVA 4.  CAD 5.  Chronic systolic heart failure 6.  Atrial flutter  No Known Allergies   Procedures This Admission:  1.  Implantation of a STJ dual chamber PPM on 01/18/19 by Dr Lovena Le. There were no immediate post procedure complications. 2.  CXR on 01/19/19 demonstrated no pneumothorax status post device implantation.   Brief HPI: Trevor Nelli. is a 85 y.o. male was referred to electrophysiology in the outpatient setting for consideration of PPM implantation.  Past medical history includes atrial flutter and intermittent heart block.  The patient has had symptomatic bradycardia without reversible causes identified.  Risks, benefits, and alternatives to PPM implantation were reviewed with the patient who wished to proceed.   Hospital Course:  The patient was admitted and underwent implantation of a STJ dual chamber pacemaker with details as outlined above.   Left chest was without hematoma or ecchymosis.  The device was interrogated and found to be functioning normally.  CXR was obtained and demonstrated no pneumothorax status post device implantation.  Wound care, arm mobility, and restrictions were reviewed with the patient.  The patient was examined and considered stable for discharge to home.    Physical Exam: Vitals:   01/18/19 2356 01/19/19 0440 01/19/19 0440 01/19/19 0840  BP: (!) 135/92 135/80 135/80 (!) 135/98  Pulse: 70 68 69 (!) 109  Resp: 18 18 18 20   Temp: 98.1 F (36.7 C) 98.3 F (36.8 C) 98.3 F (36.8 C) 97.8 F (36.6 C)  TempSrc:  Oral Oral Oral Oral  SpO2: 99% 96% 96% 100%  Weight:   94 kg   Height:        GEN- The patient is well appearing, alert and oriented x 3 today.   HEENT: normocephalic, atraumatic; sclera clear, conjunctiva pink; hearing intact; oropharynx clear; neck supple  Lungs- Clear to ausculation bilaterally, normal work of breathing.  No wheezes, rales, rhonchi Heart- Regular rate and rhythm  GI- soft, non-tender, non-distended, bowel sounds present  Extremities- no clubbing, cyanosis, or edema  MS- no significant deformity or atrophy Skin- warm and dry, no rash or lesion, left chest without hematoma/ecchymosis Psych- euthymic mood, full affect Neuro- strength and sensation are intact   Labs:   Lab Results  Component Value Date   WBC 4.2 01/05/2019   HGB 14.8 01/05/2019   HCT 43.0 01/05/2019   MCV 86 01/05/2019   PLT 216 01/05/2019   No results for input(s): NA, K, CL, CO2, BUN, CREATININE, CALCIUM, PROT, BILITOT, ALKPHOS, ALT, AST, GLUCOSE in the last 168 hours.  Invalid input(s): LABALBU  Discharge Medications:  Allergies as of 01/19/2019   No Known Allergies     Medication List    TAKE these medications   acetaminophen 325 MG tablet Commonly known as: TYLENOL Take 2 tablets (650 mg total) by mouth every 6 (six) hours as needed for mild pain (or Fever >/= 101). What changed: how much to take   allopurinol 100 MG tablet Commonly known as: ZYLOPRIM Take 200 mg by mouth daily.   amLODipine 5 MG tablet Commonly known as: NORVASC Take 1 tablet (  5 mg total) by mouth daily.   apixaban 5 MG Tabs tablet Commonly known as: ELIQUIS Take 1 tablet (5 mg total) by mouth 2 (two) times daily. Start 01/24/19 evening dose What changed: additional instructions   atorvastatin 40 MG tablet Commonly known as: LIPITOR Take 40 mg by mouth at bedtime.   brimonidine 0.1 % Soln Commonly known as: ALPHAGAN P Place 1 drop into both eyes 2 (two) times daily.   Calcium 600+D 600-400 MG-UNIT  tablet Generic drug: Calcium Carbonate-Vitamin D Take 1 tablet by mouth daily.   cholecalciferol 25 MCG (1000 UT) tablet Commonly known as: VITAMIN D Take 1,000 Units by mouth daily.   dorzolamide-timolol 22.3-6.8 MG/ML ophthalmic solution Commonly known as: COSOPT Place 2 drops into both eyes Twice daily.   metoprolol tartrate 25 MG tablet Commonly known as: LOPRESSOR Take 1 tablet (25 mg total) by mouth as needed. What changed:   how much to take  when to take this   polyvinyl alcohol 1.4 % ophthalmic solution Commonly known as: LIQUIFILM TEARS Place 1 drop into both eyes as needed for dry eyes.   prednisoLONE acetate 1 % ophthalmic suspension Commonly known as: PRED FORTE Place 1 drop into both eyes daily.   tamsulosin 0.4 MG Caps capsule Commonly known as: FLOMAX Take 0.4 mg by mouth at bedtime.       Disposition:   Follow-up Information    Baldwin Jamaica, PA-C Follow up on 02/02/2019.   Specialty: Cardiology Why: at 11:45AM Contact information: [address] [place] [address] [phone]        Evans Lance, MD Follow up on 04/21/2019.   Specialty: Cardiology Why: at [place] information: [place] [address] [place] [address] [phone]           Duration of Discharge Encounter: Greater than 30 minutes including physician time.  Signed, Chanetta Marshall, NP 01/19/2019 9:06 AM  EP attending  Patient seen and examined.  Agree with the findings as noted above.  The patient is status post insertion of the dual-chamber pacemaker secondary to symptomatic tacky bradycardia syndrome.  He is doing well.  Chest x-ray looks good.  Pacemaker interrogation demonstrates normal device function.  He will be discharged home with usual follow-up.  He will hold his systemic anticoagulation for 4 days.  Cristopher Peru, MD

## 2019-01-19 NOTE — Plan of Care (Signed)

## 2019-01-19 NOTE — Progress Notes (Signed)
Discharge teaching complete. Meds, diet, activity, follow up appointments and incision care reviewed and all questions answered. Copy of instructions given to patient. Metoprolol dose clarified with Amber, NP and she wants patient to take it how he was taking it before which was 12.5mg  daily. I relayed this information to the patient even though it was not on the med rec. Patient discharged home via wheelchair with daughter.

## 2019-01-22 ENCOUNTER — Inpatient Hospital Stay (HOSPITAL_COMMUNITY): Admission: RE | Admit: 2019-01-22 | Payer: Medicare Other | Source: Ambulatory Visit

## 2019-01-26 ENCOUNTER — Encounter (HOSPITAL_COMMUNITY): Payer: Self-pay | Admitting: Internal Medicine

## 2019-02-01 ENCOUNTER — Other Ambulatory Visit (INDEPENDENT_AMBULATORY_CARE_PROVIDER_SITE_OTHER): Payer: Self-pay | Admitting: Emergency Medicine

## 2019-02-02 ENCOUNTER — Encounter: Payer: Medicare Other | Admitting: Physician Assistant

## 2019-02-03 ENCOUNTER — Ambulatory Visit (INDEPENDENT_AMBULATORY_CARE_PROVIDER_SITE_OTHER): Payer: Medicare Other | Admitting: *Deleted

## 2019-02-03 ENCOUNTER — Other Ambulatory Visit: Payer: Self-pay

## 2019-02-03 DIAGNOSIS — I442 Atrioventricular block, complete: Secondary | ICD-10-CM

## 2019-02-03 LAB — CUP PACEART INCLINIC DEVICE CHECK
Battery Remaining Longevity: 105 mo
Battery Voltage: 3.05 V
Brady Statistic RA Percent Paced: 0 %
Brady Statistic RV Percent Paced: 55 %
Date Time Interrogation Session: 20201202124800
Implantable Lead Implant Date: 20201116
Implantable Lead Implant Date: 20201116
Implantable Lead Location: 753859
Implantable Lead Location: 753860
Implantable Pulse Generator Implant Date: 20201116
Lead Channel Impedance Value: 487.5 Ohm
Lead Channel Impedance Value: 750 Ohm
Lead Channel Pacing Threshold Amplitude: 0.5 V
Lead Channel Pacing Threshold Pulse Width: 0.5 ms
Lead Channel Sensing Intrinsic Amplitude: 1.2 mV
Lead Channel Sensing Intrinsic Amplitude: 12 mV
Lead Channel Setting Pacing Amplitude: 3.5 V
Lead Channel Setting Pacing Amplitude: 3.5 V
Lead Channel Setting Pacing Pulse Width: 0.5 ms
Lead Channel Setting Sensing Sensitivity: 0.7 mV
Pulse Gen Model: 2272
Pulse Gen Serial Number: 9171567

## 2019-02-03 NOTE — Patient Instructions (Signed)
Call office if you have drainage , swelling or redness at wound site.

## 2019-02-03 NOTE — Progress Notes (Signed)
Wound check appointment. Steri-strips removed. Wound without redness or edema. Incision edges approximated, wound well healed. Normal device function. Thresholds, sensing, and impedances consistent with implant measurements. Device programmed at 3.5V/auto capture programmed on for extra safety margin until 3 month visit. Histogram distribution appropriate for patient and level of activity. AT/AF burden 98%, 13 AMS events with 8 EGMs that appear to be AF with controlled v-rates, longest episode was 1 day, current episode ongoing AF.+ Eliquis. No  high ventricular rates noted. Patient educated about wound care, arm mobility, lifting restrictions. Enrolled in remote transmissions and next remote 04/22/19. ROV with Dr Lovena Le 04/21/19.

## 2019-04-14 ENCOUNTER — Other Ambulatory Visit: Payer: Self-pay | Admitting: Nurse Practitioner

## 2019-04-21 ENCOUNTER — Other Ambulatory Visit: Payer: Self-pay

## 2019-04-21 ENCOUNTER — Encounter: Payer: Self-pay | Admitting: Internal Medicine

## 2019-04-21 ENCOUNTER — Ambulatory Visit (INDEPENDENT_AMBULATORY_CARE_PROVIDER_SITE_OTHER): Payer: Medicare Other | Admitting: Internal Medicine

## 2019-04-21 VITALS — BP 130/78 | HR 63 | Ht 74.0 in | Wt 216.0 lb

## 2019-04-21 DIAGNOSIS — I10 Essential (primary) hypertension: Secondary | ICD-10-CM

## 2019-04-21 DIAGNOSIS — I442 Atrioventricular block, complete: Secondary | ICD-10-CM

## 2019-04-21 DIAGNOSIS — Z950 Presence of cardiac pacemaker: Secondary | ICD-10-CM

## 2019-04-21 DIAGNOSIS — I484 Atypical atrial flutter: Secondary | ICD-10-CM | POA: Diagnosis not present

## 2019-04-21 MED ORDER — FUROSEMIDE 20 MG PO TABS: 20.0000 mg | ORAL_TABLET | Freq: Every day | ORAL | 3 refills | Status: DC

## 2019-04-21 NOTE — Progress Notes (Signed)
HPI Trevor Andrews returns today for followup. He has a h/o atrial flutter and fib and CHB and long pauses and underwent PPM insertion 3 months ago. He has done well in the interim but does note that he has dyspnea with exertion. No edema. He states that his breathing has a hard time catching up. No Known Allergies   Current Outpatient Medications  Medication Sig Dispense Refill  . acetaminophen (TYLENOL) 325 MG tablet Take 2 tablets (650 mg total) by mouth every 6 (six) hours as needed for mild pain (or Fever >/= 101).    Marland Kitchen allopurinol (ZYLOPRIM) 100 MG tablet Take 200 mg by mouth daily.     Marland Kitchen amLODipine (NORVASC) 5 MG tablet Take 1 tablet (5 mg total) by mouth daily. 90 tablet 3  . atorvastatin (LIPITOR) 40 MG tablet Take 40 mg by mouth at bedtime.     . brimonidine (ALPHAGAN P) 0.1 % SOLN Place 1 drop into both eyes 2 (two) times daily.     . Calcium Carbonate-Vitamin D (CALCIUM 600+D) 600-400 MG-UNIT per tablet Take 1 tablet by mouth daily.     . cholecalciferol (VITAMIN D) 1000 UNITS tablet Take 1,000 Units by mouth daily.    . dorzolamide-timolol (COSOPT) 22.3-6.8 MG/ML ophthalmic solution Place 2 drops into both eyes Twice daily.    . metoprolol tartrate (LOPRESSOR) 25 MG tablet Take 1 tablet (25 mg total) by mouth as needed. 30 tablet 0  . polyvinyl alcohol (LIQUIFILM TEARS) 1.4 % ophthalmic solution Place 1 drop into both eyes as needed for dry eyes.     . prednisoLONE acetate (PRED FORTE) 1 % ophthalmic suspension Place 1 drop into both eyes daily.     . tamsulosin (FLOMAX) 0.4 MG CAPS capsule Take 0.4 mg by mouth at bedtime.    Marland Kitchen apixaban (ELIQUIS) 5 MG TABS tablet Take 1 tablet (5 mg total) by mouth 2 (two) times daily. Start 01/24/19 evening dose 60 tablet 3  . furosemide (LASIX) 20 MG tablet Take 1 tablet (20 mg total) by mouth daily. 90 tablet 3   No current facility-administered medications for this visit.     Past Medical History:  Diagnosis Date  . Arthritis   .  Blindness    due to glaucoma and cataract  . Cancer [facility]) 1998   Prostate. followed by urology  . CHF (congestive heart failure) ([place])   . CKD (chronic kidney disease), stage II   . Coronary artery disease    Had stent placed 2015???  . Dementia ([place])   . Diabetes mellitus ([place])   . GERD (gastroesophageal reflux disease)   . Hypercholesteremia   . Hypertension   . Shortness of breath dyspnea   . Stroke [facility]) 2016   a. on ASA/Plavix - felt 2/2 small vessel disease source per neuro notes.  . Vertigo     ROS:   All systems reviewed and negative except as noted in the HPI.   Past Surgical History:  Procedure Laterality Date  . CATARACT EXTRACTION  2011 and 2012   2011lt eye and rt eye 2012  . CORONARY ANGIOPLASTY WITH STENT PLACEMENT  2015???  . glauma implant  2012   rt.eye  . PACEMAKER IMPLANT N/A 01/18/2019   Procedure: PACEMAKER IMPLANT;  Surgeon: Evans Lance, MD;  Location: [place] [facility];  Service: Cardiovascular;  Laterality: N/A;  . PROSTATECTOMY  1998     Family History  Problem Relation Age of Onset  . Premature CHD  Neg Hx      Social History   Socioeconomic History  . Marital status: Married    Spouse name: Not on file  . Number of children: Not on file  . Years of education: Not on file  . Highest education level: Not on file  Occupational History  . Not on file  Tobacco Use  . Smoking status: Never Smoker  . Smokeless tobacco: Never Used  Substance and Sexual Activity  . Alcohol use: Yes    Alcohol/week: 2.0 standard drinks    Types: 2 Shots of liquor per week    Comment: rare  . Drug use: No  . Sexual activity: Not Currently  Other Topics Concern  . Not on file  Social History Narrative   Retired English as a second language teacher   Lives with daughter   Social Determinants of Health   Financial Resource Strain:   . Difficulty of Paying Living Expenses: Not on file  Food Insecurity:   . Worried About Charity fundraiser in the Last Year: Not on file    . Ran Out of Food in the Last Year: Not on file  Transportation Needs:   . Lack of Transportation (Medical): Not on file  . Lack of Transportation (Non-Medical): Not on file  Physical Activity:   . Days of Exercise per Week: Not on file  . Minutes of Exercise per Session: Not on file  Stress:   . Feeling of Stress : Not on file  Social Connections:   . Frequency of Communication with Friends and Family: Not on file  . Frequency of Social Gatherings with Friends and Family: Not on file  . Attends Religious Services: Not on file  . Active Member of Clubs or Organizations: Not on file  . Attends Archivist Meetings: Not on file  . Marital Status: Not on file  Intimate Partner Violence:   . Fear of Current or Ex-Partner: Not on file  . Emotionally Abused: Not on file  . Physically Abused: Not on file  . Sexually Abused: Not on file     BP 130/78   Pulse 63   Ht 6\' 2"  (1.88 m)   Wt 216 lb (98 kg)   SpO2 99%   BMI 27.73 kg/m   Physical Exam:  Well appearing NAD HEENT: Unremarkable Neck:  No JVD, no thyromegally Lymphatics:  No adenopathy Back:  No CVA tenderness Lungs:  Clear HEART:  Regular rate rhythm, no murmurs, no rubs, no clicks Abd:  soft, positive bowel sounds, no organomegally, no rebound, no guarding Ext:  2 plus pulses, no edema, no cyanosis, no clubbing Skin:  No rashes no nodules Neuro:  CN II through XII intact, motor grossly intact  ECG - atrial flutter with ventricular pacing  DEVICE  Normal device function.  See PaceArt for details. Underlying rhythm is atrial fib/flutter  Assess/Plan: 1. CHB - he is asymptomatic, s/p PPM insertion. 2. Diastolic heart failure - I encouraged the patient to start lasix 20 mg daily. If his breathing improves he will remain on the lasix but if not, he will stop the lasix. 3. HTN - his bp is well controlled. Today. We will follow. 4. Atrial fib/flutter - today he appears to be in typical flutter. His rate is  controlled and he does not have palpitations. He will continue systemic anti-coagulation. I consider DCCV but not sure that he is symptomatic.   Mikle Bosworth.D.

## 2019-04-21 NOTE — Patient Instructions (Addendum)
Medication Instructions:  Your physician has recommended you make the following change in your medication:   1.  Start taking lasix 20 mg-  Take one tablet by mouth daily.  I will call you in 2 weeks to see how you are doing  Labwork: None ordered.  Testing/Procedures: None ordered.  Follow-Up: Your physician wants you to follow-up in: one year with Dr. Lovena Le.   You will receive a reminder letter in the mail two months in advance. If you don't receive a letter, please call our office to schedule the follow-up appointment.  Remote monitoring is used to monitor your Pacemaker from home. This monitoring reduces the number of office visits required to check your device to one time per year. It allows [place] to keep an eye on the functioning of your device to ensure it is working properly. You are scheduled for a device check from home on 04/22/2019. You may send your transmission at any time that day. If you have a wireless device, the transmission will be sent automatically. After your physician reviews your transmission, you will receive a postcard with your next transmission date.  Any Other Special Instructions Will Be Listed Below (If Applicable).  If you need a refill on your cardiac medications before your next appointment, please call your pharmacy.

## 2019-04-22 ENCOUNTER — Ambulatory Visit (INDEPENDENT_AMBULATORY_CARE_PROVIDER_SITE_OTHER): Payer: Medicare Other | Admitting: *Deleted

## 2019-04-22 DIAGNOSIS — I442 Atrioventricular block, complete: Secondary | ICD-10-CM | POA: Diagnosis not present

## 2019-04-22 LAB — CUP PACEART REMOTE DEVICE CHECK
Battery Remaining Longevity: 119 mo
Battery Remaining Percentage: 95.5 %
Battery Voltage: 3.01 V
Brady Statistic AP VP Percent: 0 %
Brady Statistic AP VS Percent: 0 %
Brady Statistic AS VP Percent: 55 %
Brady Statistic AS VS Percent: 40 %
Brady Statistic RA Percent Paced: 0 %
Brady Statistic RV Percent Paced: 67 %
Date Time Interrogation Session: 20210218020014
Implantable Lead Implant Date: 20201116
Implantable Lead Implant Date: 20201116
Implantable Lead Location: 753859
Implantable Lead Location: 753860
Implantable Pulse Generator Implant Date: 20201116
Lead Channel Impedance Value: 540 Ohm
Lead Channel Impedance Value: 790 Ohm
Lead Channel Pacing Threshold Amplitude: 0.5 V
Lead Channel Pacing Threshold Pulse Width: 0.5 ms
Lead Channel Sensing Intrinsic Amplitude: 1.2 mV
Lead Channel Sensing Intrinsic Amplitude: 12 mV
Lead Channel Setting Pacing Amplitude: 2 V
Lead Channel Setting Pacing Amplitude: 2.5 V
Lead Channel Setting Pacing Pulse Width: 0.5 ms
Lead Channel Setting Sensing Sensitivity: 0.7 mV
Pulse Gen Model: 2272
Pulse Gen Serial Number: 9171567

## 2019-04-22 NOTE — Progress Notes (Signed)
PPM Remote  

## 2019-05-06 ENCOUNTER — Telehealth: Payer: Self-pay

## 2019-05-06 NOTE — Telephone Encounter (Signed)
-----   Message from Damian Leavell, RN sent at 04/22/2019  5:34 PM EST ----- Regarding: call in 2 weeks to see how he is doing on lasix

## 2019-05-06 NOTE — Telephone Encounter (Signed)
Call placed to Pt.  Pt states he feels a little better with his breathing on the 20 mg daily of lasix, but thinks it may need to be increased.  Per Dr. Sherron Ales Pt try 40 mg lasix daily.  Will call Pt next week, if he feels improved will have him come in for a BMP.

## 2019-05-22 ENCOUNTER — Other Ambulatory Visit: Payer: Self-pay

## 2019-05-22 ENCOUNTER — Emergency Department (HOSPITAL_COMMUNITY): Payer: No Typology Code available for payment source

## 2019-05-22 ENCOUNTER — Observation Stay (HOSPITAL_COMMUNITY)
Admission: EM | Admit: 2019-05-22 | Discharge: 2019-05-23 | Disposition: A | Discharge status: Home / Self Care | Payer: No Typology Code available for payment source | Attending: Family Medicine | Admitting: Family Medicine

## 2019-05-22 ENCOUNTER — Encounter (HOSPITAL_COMMUNITY): Payer: Self-pay

## 2019-05-22 DIAGNOSIS — I442 Atrioventricular block, complete: Secondary | ICD-10-CM | POA: Diagnosis not present

## 2019-05-22 DIAGNOSIS — N183 Chronic kidney disease, stage 3 unspecified: Secondary | ICD-10-CM | POA: Insufficient documentation

## 2019-05-22 DIAGNOSIS — I130 Hypertensive heart and chronic kidney disease with heart failure and stage 1 through stage 4 chronic kidney disease, or unspecified chronic kidney disease: Secondary | ICD-10-CM | POA: Diagnosis not present

## 2019-05-22 DIAGNOSIS — E1122 Type 2 diabetes mellitus with diabetic chronic kidney disease: Secondary | ICD-10-CM | POA: Insufficient documentation

## 2019-05-22 DIAGNOSIS — H42 Glaucoma in diseases classified elsewhere: Secondary | ICD-10-CM | POA: Diagnosis not present

## 2019-05-22 DIAGNOSIS — E1151 Type 2 diabetes mellitus with diabetic peripheral angiopathy without gangrene: Secondary | ICD-10-CM | POA: Diagnosis not present

## 2019-05-22 DIAGNOSIS — I5042 Chronic combined systolic (congestive) and diastolic (congestive) heart failure: Secondary | ICD-10-CM | POA: Insufficient documentation

## 2019-05-22 DIAGNOSIS — E785 Hyperlipidemia, unspecified: Secondary | ICD-10-CM | POA: Diagnosis present

## 2019-05-22 DIAGNOSIS — Z23 Encounter for immunization: Secondary | ICD-10-CM | POA: Diagnosis not present

## 2019-05-22 DIAGNOSIS — R079 Chest pain, unspecified: Principal | ICD-10-CM | POA: Insufficient documentation

## 2019-05-22 DIAGNOSIS — I69354 Hemiplegia and hemiparesis following cerebral infarction affecting left non-dominant side: Secondary | ICD-10-CM | POA: Insufficient documentation

## 2019-05-22 DIAGNOSIS — R42 Dizziness and giddiness: Secondary | ICD-10-CM

## 2019-05-22 DIAGNOSIS — E7800 Pure hypercholesterolemia, unspecified: Secondary | ICD-10-CM | POA: Insufficient documentation

## 2019-05-22 DIAGNOSIS — Z20822 Contact with and (suspected) exposure to covid-19: Secondary | ICD-10-CM | POA: Diagnosis not present

## 2019-05-22 DIAGNOSIS — M1990 Unspecified osteoarthritis, unspecified site: Secondary | ICD-10-CM | POA: Diagnosis not present

## 2019-05-22 DIAGNOSIS — I4891 Unspecified atrial fibrillation: Secondary | ICD-10-CM | POA: Diagnosis not present

## 2019-05-22 DIAGNOSIS — Z8546 Personal history of malignant neoplasm of prostate: Secondary | ICD-10-CM

## 2019-05-22 DIAGNOSIS — N400 Enlarged prostate without lower urinary tract symptoms: Secondary | ICD-10-CM | POA: Diagnosis not present

## 2019-05-22 DIAGNOSIS — Z950 Presence of cardiac pacemaker: Secondary | ICD-10-CM | POA: Diagnosis present

## 2019-05-22 DIAGNOSIS — I2510 Atherosclerotic heart disease of native coronary artery without angina pectoris: Secondary | ICD-10-CM | POA: Insufficient documentation

## 2019-05-22 DIAGNOSIS — I4892 Unspecified atrial flutter: Secondary | ICD-10-CM | POA: Insufficient documentation

## 2019-05-22 DIAGNOSIS — Z955 Presence of coronary angioplasty implant and graft: Secondary | ICD-10-CM | POA: Insufficient documentation

## 2019-05-22 DIAGNOSIS — I10 Essential (primary) hypertension: Secondary | ICD-10-CM | POA: Diagnosis present

## 2019-05-22 DIAGNOSIS — F0390 Unspecified dementia without behavioral disturbance: Secondary | ICD-10-CM | POA: Diagnosis not present

## 2019-05-22 DIAGNOSIS — E1139 Type 2 diabetes mellitus with other diabetic ophthalmic complication: Secondary | ICD-10-CM | POA: Insufficient documentation

## 2019-05-22 DIAGNOSIS — K219 Gastro-esophageal reflux disease without esophagitis: Secondary | ICD-10-CM | POA: Insufficient documentation

## 2019-05-22 DIAGNOSIS — M109 Gout, unspecified: Secondary | ICD-10-CM | POA: Diagnosis not present

## 2019-05-22 DIAGNOSIS — H4010X0 Unspecified open-angle glaucoma, stage unspecified: Secondary | ICD-10-CM | POA: Diagnosis not present

## 2019-05-22 DIAGNOSIS — Z7901 Long term (current) use of anticoagulants: Secondary | ICD-10-CM | POA: Insufficient documentation

## 2019-05-22 DIAGNOSIS — Z9079 Acquired absence of other genital organ(s): Secondary | ICD-10-CM | POA: Insufficient documentation

## 2019-05-22 DIAGNOSIS — I5032 Chronic diastolic (congestive) heart failure: Secondary | ICD-10-CM | POA: Diagnosis present

## 2019-05-22 DIAGNOSIS — M069 Rheumatoid arthritis, unspecified: Secondary | ICD-10-CM | POA: Insufficient documentation

## 2019-05-22 DIAGNOSIS — I63311 Cerebral infarction due to thrombosis of right middle cerebral artery: Secondary | ICD-10-CM | POA: Diagnosis present

## 2019-05-22 DIAGNOSIS — Z79899 Other long term (current) drug therapy: Secondary | ICD-10-CM | POA: Insufficient documentation

## 2019-05-22 LAB — BASIC METABOLIC PANEL
Anion gap: 7 (ref 5–15)
BUN: 12 mg/dL (ref 8–23)
CO2: 25 mmol/L (ref 22–32)
Calcium: 8.7 mg/dL — ABNORMAL LOW (ref 8.9–10.3)
Chloride: 108 mmol/L (ref 98–111)
Creatinine, Ser: 1.11 mg/dL (ref 0.61–1.24)
GFR calc Af Amer: >60 mL/min (ref >60)
GFR calc non Af Amer: 59 mL/min — ABNORMAL LOW (ref >60)
Glucose, Bld: 95 mg/dL (ref 70–99)
Potassium: 3.7 mmol/L (ref 3.5–5.1)
Sodium: 140 mmol/L (ref 135–145)

## 2019-05-22 LAB — CBC WITH DIFFERENTIAL/PLATELET
Abs Immature Granulocytes: 0 10*3/uL (ref 0.00–0.07)
Basophils Absolute: 0 10*3/uL (ref 0.0–0.1)
Basophils Relative: 1 %
Eosinophils Absolute: 0.1 10*3/uL (ref 0.0–0.5)
Eosinophils Relative: 2 %
HCT: 44.4 % (ref 39.0–52.0)
Hemoglobin: 14.3 g/dL (ref 13.0–17.0)
Immature Granulocytes: 0 %
Lymphocytes Relative: 37 %
Lymphs Abs: 1.5 10*3/uL (ref 0.7–4.0)
MCH: 28.8 pg (ref 26.0–34.0)
MCHC: 32.2 g/dL (ref 30.0–36.0)
MCV: 89.3 fL (ref 80.0–100.0)
Monocytes Absolute: 0.5 10*3/uL (ref 0.1–1.0)
Monocytes Relative: 13 %
Neutro Abs: 1.9 10*3/uL (ref 1.7–7.7)
Neutrophils Relative %: 47 %
Platelets: 191 10*3/uL (ref 150–400)
RBC: 4.97 MIL/uL (ref 4.22–5.81)
RDW: 15.4 % (ref 11.5–15.5)
WBC: 3.9 10*3/uL — ABNORMAL LOW (ref 4.0–10.5)
nRBC: 0 % (ref 0.0–0.2)

## 2019-05-22 LAB — RESPIRATORY PANEL BY RT PCR (FLU A&B, COVID)
Influenza A by PCR: NEGATIVE
Influenza B by PCR: NEGATIVE
SARS Coronavirus 2 by RT PCR: NEGATIVE

## 2019-05-22 LAB — TROPONIN I (HIGH SENSITIVITY)
Troponin I (High Sensitivity): 9 ng/L (ref <18)
Troponin I (High Sensitivity): 9 ng/L (ref <18)

## 2019-05-22 NOTE — ED Notes (Signed)
Admitting at bedside 

## 2019-05-22 NOTE — H&P (Signed)
[facility] Admission History and Physical Service Pager: [phone]  Patient name: Trevor Andrews. Medical record number: [id number] Date of birth: May 21, 1929 Age: 86 y.o. Gender: male  Primary Care Provider: Wilber Oliphant, MD Consultants: none  Code Status: full code  Chief Complaint: chest pain  Assessment and Plan: Obryant Borey. is a 86 y.o. male presenting with dizziness and chest pain. PMH is significant for a flutter, complete heart block status post pacemaker, CVA of MCA with residual very mild left-sided weakness, dementia, hypertension, CKD stage III, open-angle glaucoma, HLD.,  CHF  Chest pain, rule out ACS Patient presenting to ED, accompanied by daughter after episodes of dizziness and chest pain  Patient also has history of dementia and is somewhat poor historian.  Patient reports that his first episode of chest pain was yesterday while he was walking around his yard.  He did not have any chest pain while walking around in [place].  The chest pain recurred today after getting home from [place] and laying down for a little bit.  He does report that he continues to do exercises from previous stroke and was not sure if the chest pain was due to that.  He also endorses history of GERD and indigestion which has caused him chest pain in the past.  He does report that the chest pain was different from the feelings of indigestion.  On physical exam, patient's heart sounds are distant but RP & DP are 2+.  He does not have any lower extremity edema or JVD.  His chest pain is not reproducible but noted to be located in left midclavicular line at ribs 3-5.  He is not currently actively having chest pain and denies any chest pain since calling 911.  His labs are grossly normal with initial troponin of 9, second draw currently pending. EKG shows a flutter with new QRS changes in precordial leads, possibly representing LBBB? Unclear patient's chest pain was related to  acute coronary syndrome. Heart score 6 (moderate risk). Can also consider GERD vs MSK. Patient felt more comfortable staying in the hospital over night. We agree given his risk factors (hx CVA, CHB s/p pacemaker, chronic a flutter, HTN, CHF), heart score and poor history.  On chart review, it does appear that patient has had EKG that showed previous inferior ischemia that was changed between EKGs over time.  There is note of possible stent placement in 2015 but no confirmatory notes or encounters.  Patient does have scar on mid chest but notes that it was due to previous cyst I&D.    Admit to [facility] - Unit: med- tele - Attending: Mcintyre  . Continuous Cardiac monitoring & pulse ox . Vitals per unit protocol . Diet heart healthy  . Out of bed with assistance, fall risk . Consult cards in the AM . Telemetry  Chronic dizziness, headaches, Hx of CVA  Not currently on any ASA or Plavix.  No concerning findings on physical exam.  Patient has not had any dizziness as he has not been out of the bed.  He does report that he gets dizziness and headaches at night while laying down.  This also causes him to get up and have headaches in the middle of the night for which he takes acetaminophen with resolution. Patient reports that he has been to multiple doctors about dizziness, headaches and told that these are likely residual effects from CVA.  Given history, unlikely that chronic dizziness is associated with  presenting chest pain.  He denies any nausea, vomiting, radiation of pain to shoulder or jaw with chest pain with episodes of dizziness.  Out of bed with assistance  Continue to monitor   CHB, a flutter, s/p pacemaker  Upon initial encounter, patient appears to have erratic changes on telemetry.  His monitor showed A. fib as well as paced rhythms.  Patient was asymptomatic during physical exam.  During his last visit with Dr. Lovena Le on 04/21/2019, there was some consideration for DCCV but he did not have  any symptoms at that time.  Consult cardiology in the morning.  Continue home medications: Eliquis twice daily, metoprolol  HTN Patient's blood pressures ranging from 120-140/75-94.  Continue home medications: Amlodipine, metoprolol, Lasix 40 mg  Diastolic heart failure, HFrEF Last echo obtained 12/17/2018 showing EF 40 to 45% and mildly to moderately decreased function.  Mild LVH normal valves, mildly elevated pulmonary artery pressure, global LV systolic function reduction. No SOB today, lungs clear and no JVD or BLEE.   Continue home medications: lasix 40 mg  CKD 3 GFR greater than 60, creatinine 1.11  Avoid nephrotoxic medications  BPH, hx prostate cancer  . Continue home flomax  HLD:  Continue home atorvastatin  Glaucoma . Continue home meds: cosopt, alphagan, pred forte)  Gout . Continue home meds: allopurinol   Rheumatoid arthritis . Continue home acetominophen PRN  FEN/GI: P.o., heart healthy Prophylaxis: Eliquis twice daily  Disposition: Admit for observation on medical telemetry  History of Present Illness:  Trevor Andrews. is a 86 y.o. male presenting with after experiencing dizziness and chest pain.  The patient went to [employer] this morning with his daughter and was having difficulty breathing and dizziness after about 15 minutes of walking. They went home and the patient developed some 'sharp' chest pain after he had been laying down. At this point he asked his daughter to call an ambulance. He hasn't had chest pain since that time. No radiation of pain to jaw or arm.   The patient has been having chest discomfort as well as dizziness for several days.  His dizziness has been an issue since his stroke about 3 years ago.  He has residual left leg weakness.  The chest pain occurs when he is 'walking around' or doing his exercises or lifting things. Today he woke up and had chest pain he describes as 'aching' while he was still laying down.    The patient has  headaches at night that causes him not to be able to sleep. He takes tylenol which help.  The patient has rheumatoid arthritis and takes tylenol.   Review Of Systems: Per HPI with the following additions:   Review of Systems  Constitutional: Negative for chills, fever and weight loss.  HENT: Negative for congestion and sinus pain.   Eyes: Negative for pain.  Respiratory: Negative for cough, shortness of breath and wheezing.   Cardiovascular: Positive for chest pain and palpitations. Negative for orthopnea and leg swelling.  Gastrointestinal: Positive for heartburn. Negative for abdominal pain, nausea and vomiting.  Genitourinary: Negative for dysuria.  Musculoskeletal: Negative for falls and myalgias.  Skin: Negative for itching and rash.  Neurological: Positive for dizziness and headaches. Negative for speech change, focal weakness and weakness.  Endo/Heme/Allergies: Does not bruise/bleed easily.  Psychiatric/Behavioral: Negative for depression and memory loss. The patient has insomnia.        Difficulty sleeping    Patient Active Problem List   Diagnosis Date Noted  .  Chest pain 05/22/2019  . Pacemaker 04/21/2019  . Complete heart block, transient ([place]) 01/18/2019  . Atrial flutter ([place]) 01/05/2019  . Heart block AV complete ([place]) 01/05/2019  . History of prostate cancer 04/23/2018  . Onychomycosis 09/15/2017  . Open-angle glaucoma 06/27/2017  . Bifascicular block 10/19/2015  . CKD (chronic kidney disease), stage III 10/18/2015  . Chronic diastolic CHF (congestive heart failure) ([place]) 10/18/2015  . Chest pain at rest 10/18/2015  . Essential hypertension 11/23/2014  . Prediabetes 11/23/2014  . HLD (hyperlipidemia) 11/23/2014  . Dizziness 11/23/2014  . Gait apraxia 10/18/2014  . Hypertension 10/18/2014  . Dementia ([place]) 10/18/2014  . History of coronary artery stent placement 10/18/2014  . Cerebral infarction due to thrombosis of right middle cerebral artery [facility])      Past Medical History: Past Medical History:  Diagnosis Date  . Arthritis   . Blindness    due to glaucoma and cataract  . Cancer [facility]) 1998   Prostate. followed by urology  . CHF (congestive heart failure) ([place])   . CKD (chronic kidney disease), stage II   . Coronary artery disease    Had stent placed 2015???  . Dementia ([place])   . Diabetes mellitus ([place])   . GERD (gastroesophageal reflux disease)   . Hypercholesteremia   . Hypertension   . Shortness of breath dyspnea   . Stroke [facility]) 2016   a. on ASA/Plavix - felt 2/2 small vessel disease source per neuro notes.  . Vertigo     Past Surgical History: Past Surgical History:  Procedure Laterality Date  . CATARACT EXTRACTION  2011 and 2012   2011lt eye and rt eye 2012  . CORONARY ANGIOPLASTY WITH STENT PLACEMENT  2015???  . glauma implant  2012   rt.eye  . PACEMAKER IMPLANT N/A 01/18/2019   Procedure: PACEMAKER IMPLANT;  Surgeon: Evans Lance, MD;  Location: [place] CV LAB;  Service: Cardiovascular;  Laterality: N/A;  . PROSTATECTOMY  1998    Social History: Social History   Tobacco Use  . Smoking status: Never Smoker  . Smokeless tobacco: Never Used  Substance Use Topics  . Alcohol use: Yes    Alcohol/week: 2.0 standard drinks    Types: 2 Shots of liquor per week    Comment: rare  . Drug use: No   Additional social history: daughters take turns staying with him.  He has 24 hr supervision.  Please also refer to relevant sections of EMR.  Family History: Family History  Problem Relation Age of Onset  . Premature CHD Neg Hx     Allergies and Medications: No Known Allergies No current facility-administered medications on file prior to encounter.   Current Outpatient Medications on File Prior to Encounter  Medication Sig Dispense Refill  . acetaminophen (TYLENOL) 500 MG tablet Take 1,000 mg by mouth every 6 (six) hours as needed (pain).    Marland Kitchen allopurinol (ZYLOPRIM) 100 MG tablet Take 100 mg by  mouth daily.     Marland Kitchen amLODipine (NORVASC) 5 MG tablet Take 1 tablet (5 mg total) by mouth daily. 90 tablet 3  . apixaban (ELIQUIS) 5 MG TABS tablet Take 1 tablet (5 mg total) by mouth 2 (two) times daily. Start 01/24/19 evening dose 60 tablet 3  . atorvastatin (LIPITOR) 40 MG tablet Take 40 mg by mouth at bedtime.     . brimonidine (ALPHAGAN P) 0.1 % SOLN Place 1 drop into both eyes 2 (two) times daily.     . dorzolamide-timolol (COSOPT) 22.3-6.8  MG/ML ophthalmic solution Place 2 drops into both eyes Twice daily.    . furosemide (LASIX) 20 MG tablet Take 1 tablet (20 mg total) by mouth daily. (Patient taking differently: Take 40 mg by mouth daily. ) 90 tablet 3  . metoprolol tartrate (LOPRESSOR) 50 MG tablet Take 25 mg by mouth daily.    . prednisoLONE acetate (PRED FORTE) 1 % ophthalmic suspension Place 1 drop into both eyes daily.     . tamsulosin (FLOMAX) 0.4 MG CAPS capsule Take 0.4 mg by mouth at bedtime.      Objective: BP 134/86   Pulse (!) 39   Temp 98.5 F (36.9 C) (Oral)   Resp 16   Ht 6' 1.5" (1.867 m)   Wt 97.5 kg   SpO2 97%   BMI 27.98 kg/m  Gen: NAD, alert, non-toxic, well-nourished, well-appearing, pleasant HEENT: Normocephaic, atraumatic. No scleral icterus and injection. B/l pupils gray/cloudy, left eye nearly closed at baseline. Hearing intact. Neck supple with no LAD, nodules, or gross abnormality. Nares patent with no discharge.  Oropharynx without erythema and lesions.  Tonsils nonswollen and without exudate.   CV: Heart sounds distant.  Unable to appreciate S1-S2, gallops, murmurs.  Normal S1-S2.  Normal capillary refill bilaterally.  Radial pulses 2+ bilaterally. No bilateral lower extremity edema.   Resp: Clear to auscultation bilaterally.  No wheezing, rales, rhonchi, or other abnormal lung sounds.  No increased work of breathing appreciated. Abd: Nontender and nondistended on palpation to all 4 quadrants.  Positive bowel sounds. Skin: Left upper chest with palpable  pacemaker, no erythema or swelling in the area.  Mid chest surgical scar, well-healed.  Otherwise no obvious rashes or lesions. Neuro: Cranial nerves II through VI grossly intact (pupillary reflexes and extraocular movements deferred).  Gait deferred.  Alert and oriented x4.  No obvious abnormal movements.  5/5 strength in major muscle groups of upper and lower extremities. Psych: Cooperative with exam.  Normal speech. Pleasant Genitourinary: deferred.   Labs and Imaging: CBC BMET  Recent Labs  Lab 05/22/19 1919  WBC 3.9*  HGB 14.3  HCT 44.4  PLT 191   Recent Labs  Lab 05/22/19 1919  NA 140  K 3.7  CL 108  CO2 25  BUN 12  CREATININE 1.11  GLUCOSE 95  CALCIUM 8.7*     EKG Interpretation: a flutter.  Some changes compared to last EKG on 04/21/2019: new QRS changes in precordial leads suggesting possible LBBB?    Wilber Oliphant, MD 05/22/2019, 10:50 PM PGY-2, [place] [facility] pager: [phone], text pages welcome

## 2019-05-22 NOTE — ED Notes (Signed)
Card for pacemaker copied for med rec

## 2019-05-22 NOTE — ED Notes (Signed)
X-ray at bedside

## 2019-05-22 NOTE — ED Notes (Signed)
Second IV attempt unsuccessful. EMS also had difficulty with veins blowing.

## 2019-05-22 NOTE — ED Triage Notes (Signed)
Pt bib by ems from home. Pt is legally blind. Pt was in [place] when he had a sudden and quick event of CP/SOB, pt went home had pepto and a sandwich. Pt got a pacer in Nov and recently had his blood thinner increased. Noted on ekg that pacer allows him to brady into the 40's and then paces to 110. Pt does not feel or notice this change in hr. Other v/s stable

## 2019-05-22 NOTE — ED Provider Notes (Signed)
[place] EMERGENCY DEPARTMENT Provider Note   CSN: [id number] Arrival date & time: 05/22/19  1824     History Chief Complaint  Patient presents with  . Pacemaker problem/brady/tachy    Laytin Rubiano. is a 86 y.o. male.  86 year old male with prior medical history as detailed below presents for evaluation.  Patient reports that he was with his family in [place] shopping earlier today.  Patient had a brief episode of nonspecific chest discomfort.  This lasted approximately 1 minute.  He now is without complaint.  After the [place] shopping trip he went home and had a sandwich.  Shortly thereafter his family decided that he should come to the ED for evaluation.  Patient denies associated syncope, palpitations, shortness of breath, fever, nausea, diaphoresis, or other specific complaint.  The history is provided by the patient and medical records.  Illness Location:  Chest discomfort Severity:  Mild Onset quality:  Sudden Timing:  Rare Progression:  Resolved Chronicity:  New Associated symptoms: no fever        Past Medical History:  Diagnosis Date  . Arthritis   . Blindness    due to glaucoma and cataract  . Cancer [facility]) 1998   Prostate. followed by urology  . CHF (congestive heart failure) ([place])   . CKD (chronic kidney disease), stage II   . Coronary artery disease    Had stent placed 2015???  . Dementia ([place])   . Diabetes mellitus ([place])   . GERD (gastroesophageal reflux disease)   . Hypercholesteremia   . Hypertension   . Shortness of breath dyspnea   . Stroke [facility]) 2016   a. on ASA/Plavix - felt 2/2 small vessel disease source per neuro notes.  . Vertigo     Patient Active Problem List   Diagnosis Date Noted  . Pacemaker 04/21/2019  . Complete heart block, transient ([place]) 01/18/2019  . Atrial flutter ([place]) 01/05/2019  . Heart block AV complete ([place]) 01/05/2019  . History of prostate cancer 04/23/2018  . Cough 11/12/2017  .  Onychomycosis 09/15/2017  . Open-angle glaucoma 06/27/2017  . Encounter for immunization 10/15/2016  . Bifascicular block 10/19/2015  . CKD (chronic kidney disease), stage III 10/18/2015  . Chronic diastolic CHF (congestive heart failure) ([place]) 10/18/2015  . Chest pain at rest 10/18/2015  . Essential hypertension 11/23/2014  . Prediabetes 11/23/2014  . HLD (hyperlipidemia) 11/23/2014  . Dizziness 11/23/2014  . Gait apraxia 10/18/2014  . Hypertension 10/18/2014  . Dementia ([place]) 10/18/2014  . History of coronary artery stent placement 10/18/2014  . Cerebral infarction due to thrombosis of right middle cerebral artery [facility])     Past Surgical History:  Procedure Laterality Date  . CATARACT EXTRACTION  2011 and 2012   2011lt eye and rt eye 2012  . CORONARY ANGIOPLASTY WITH STENT PLACEMENT  2015???  . glauma implant  2012   rt.eye  . PACEMAKER IMPLANT N/A 01/18/2019   Procedure: PACEMAKER IMPLANT;  Surgeon: Evans Lance, MD;  Location: [place] CV LAB;  Service: Cardiovascular;  Laterality: N/A;  . PROSTATECTOMY  1998       Family History  Problem Relation Age of Onset  . Premature CHD Neg Hx     Social History   Tobacco Use  . Smoking status: Never Smoker  . Smokeless tobacco: Never Used  Substance Use Topics  . Alcohol use: Yes    Alcohol/week: 2.0 standard drinks    Types: 2 Shots of liquor per week  Comment: rare  . Drug use: No    Home Medications Prior to Admission medications   Medication Sig Start Date End Date Taking? Authorizing Provider  acetaminophen (TYLENOL) 325 MG tablet Take 2 tablets (650 mg total) by mouth every 6 (six) hours as needed for mild pain (or Fever >/= 101). 10/20/14   Kinnie Feil, MD  allopurinol (ZYLOPRIM) 100 MG tablet Take 200 mg by mouth daily.     [provider]  amLODipine (NORVASC) 5 MG tablet Take 1 tablet (5 mg total) by mouth daily. 09/08/17   Glenis Smoker, MD  apixaban (ELIQUIS) 5 MG TABS tablet  Take 1 tablet (5 mg total) by mouth 2 (two) times daily. Start 01/24/19 evening dose 01/19/19 02/18/19  Chanetta Marshall K, NP  atorvastatin (LIPITOR) 40 MG tablet Take 40 mg by mouth at bedtime.     [provider]  brimonidine (ALPHAGAN P) 0.1 % SOLN Place 1 drop into both eyes 2 (two) times daily.     [provider]  Calcium Carbonate-Vitamin D (CALCIUM 600+D) 600-400 MG-UNIT per tablet Take 1 tablet by mouth daily.     [provider]  cholecalciferol (VITAMIN D) 1000 UNITS tablet Take 1,000 Units by mouth daily.    [provider]  dorzolamide-timolol (COSOPT) 22.3-6.8 MG/ML ophthalmic solution Place 2 drops into both eyes Twice daily. 04/21/11   [provider]  furosemide (LASIX) 20 MG tablet Take 1 tablet (20 mg total) by mouth daily. 04/21/19 07/20/19  Evans Lance, MD  metoprolol tartrate (LOPRESSOR) 25 MG tablet Take 1 tablet (25 mg total) by mouth as needed. 11/27/18   Noemi Chapel, MD  polyvinyl alcohol (LIQUIFILM TEARS) 1.4 % ophthalmic solution Place 1 drop into both eyes as needed for dry eyes.     [provider]  prednisoLONE acetate (PRED FORTE) 1 % ophthalmic suspension Place 1 drop into both eyes daily.  04/12/11   [provider]  tamsulosin (FLOMAX) 0.4 MG CAPS capsule Take 0.4 mg by mouth at bedtime.    [provider]    Allergies    Patient has no known allergies.  Review of Systems   Review of Systems  Constitutional: Negative for fever.  All other systems reviewed and are negative.   Physical Exam Updated Vital Signs BP 132/84 (BP Location: Right Arm)   Pulse 60   Temp 98.5 F (36.9 C) (Oral)   Resp 13   Ht 6' 1.5" (1.867 m)   Wt 97.5 kg   SpO2 100%   BMI 27.98 kg/m   Physical Exam Vitals and nursing note reviewed.  Constitutional:      General: He is not in acute distress.    Appearance: Normal appearance. He is well-developed.  HENT:     Head: Normocephalic and atraumatic.   Eyes:     Conjunctiva/sclera: Conjunctivae normal.     Pupils: Pupils are equal, round, and reactive to light.  Cardiovascular:     Rate and Rhythm: Normal rate and regular rhythm.     Heart sounds: Normal heart sounds.  Pulmonary:     Effort: Pulmonary effort is normal. No respiratory distress.     Breath sounds: Normal breath sounds.  Abdominal:     General: There is no distension.     Palpations: Abdomen is soft.     Tenderness: There is no abdominal tenderness.  Musculoskeletal:        General: No deformity. Normal range of motion.     Cervical  back: Normal range of motion and neck supple.  Skin:    General: Skin is warm and dry.  Neurological:     General: No focal deficit present.     Mental Status: He is alert and oriented to person, place, and time. Mental status is at baseline.     ED Results / Procedures / Treatments   Labs (all labs ordered are listed, but only abnormal results are displayed) Labs Reviewed  CBC WITH DIFFERENTIAL/PLATELET - Abnormal; Notable for the following components:      Result Value   WBC 3.9 (*)    All other components within normal limits  BASIC METABOLIC PANEL - Abnormal; Notable for the following components:   Calcium 8.7 (*)    GFR calc non Af Amer 59 (*)    All other components within normal limits  RESPIRATORY PANEL BY RT PCR (FLU A&B, COVID)  TROPONIN I (HIGH SENSITIVITY)  TROPONIN I (HIGH SENSITIVITY)    EKG None  Radiology DG Chest Port 1 View  Result Date: 05/22/2019 CLINICAL DATA:  Shortness of breath EXAM: PORTABLE CHEST 1 VIEW COMPARISON:  01/19/2019 FINDINGS: Left pacer remains in place, unchanged. Heart is normal size. Mild elevation of the left hemidiaphragm. No confluent opacities or effusions. No acute bony abnormality. IMPRESSION: No active disease. Electronically Signed   By: Rolm Baptise M.D.   On: 05/22/2019 19:13    Procedures Procedures (including critical care time)  Medications Ordered in ED Medications  - No data to display  ED Course  I have reviewed the triage vital signs and the nursing notes.  Pertinent labs & imaging results that were available during my care of the patient were reviewed by me and considered in my medical decision making (see chart for details).    MDM Rules/Calculators/A&P                      MDM  Screen complete  Daiyan Pedrotti. was evaluated in Emergency Department on 05/22/2019 for the symptoms described in the history of present illness. He was evaluated in the context of the global COVID-19 pandemic, which necessitated consideration that the patient might be at risk for infection with the SARS-CoV-2 virus that causes COVID-19. Institutional protocols and algorithms that pertain to the evaluation of patients at risk for COVID-19 are in a state of rapid change based on information released by regulatory bodies including the CDC and federal and state organizations. These policies and algorithms were followed during the patient's care in the ED.   86 year old male was presented for evaluation of reported chest discomfort and dizziness.  Initial work-up is without significant abnormality.  Patient likely would benefit from overnight observation.  Family medicine teaching team is aware case and will evaluate for admission.     Final Clinical Impression(s) / ED Diagnoses Final diagnoses:  Chest pain, unspecified type    Rx / DC Orders ED Discharge Orders    None       Valarie Merino, MD 05/22/19 2103

## 2019-05-23 DIAGNOSIS — R079 Chest pain, unspecified: Principal | ICD-10-CM

## 2019-05-23 MED ORDER — PREDNISOLONE ACETATE 1 % OP SUSP
1.0000 [drp] | Freq: Every day | OPHTHALMIC | Status: DC
  Administered 2019-05-23: 1 [drp] via OPHTHALMIC
  Filled 2019-05-23: qty 5

## 2019-05-23 MED ORDER — DORZOLAMIDE HCL-TIMOLOL MAL 2-0.5 % OP SOLN: 1.0000 [drp] | Freq: Two times a day (BID) | OPHTHALMIC | Status: DC

## 2019-05-23 MED ORDER — OMEPRAZOLE 20 MG PO CPDR: 20.0000 mg | DELAYED_RELEASE_CAPSULE | Freq: Every day | ORAL | 0 refills | Status: DC

## 2019-05-23 MED ORDER — APIXABAN 5 MG PO TABS
5.0000 mg | ORAL_TABLET | Freq: Two times a day (BID) | ORAL | Status: DC
  Administered 2019-05-23 (×2): 5 mg via ORAL
  Filled 2019-05-23 (×2): qty 1

## 2019-05-23 MED ORDER — ATORVASTATIN CALCIUM 40 MG PO TABS
40.0000 mg | ORAL_TABLET | Freq: Every day | ORAL | Status: DC
  Administered 2019-05-23: 01:00:00 40 mg via ORAL
  Filled 2019-05-23: qty 1

## 2019-05-23 MED ORDER — FUROSEMIDE 40 MG PO TABS
40.0000 mg | ORAL_TABLET | Freq: Every day | ORAL | Status: DC
  Administered 2019-05-23: 08:00:00 40 mg via ORAL
  Filled 2019-05-23: qty 1

## 2019-05-23 MED ORDER — BRIMONIDINE TARTRATE 0.15 % OP SOLN
1.0000 [drp] | Freq: Two times a day (BID) | OPHTHALMIC | Status: DC
  Administered 2019-05-23: 01:00:00 1 [drp] via OPHTHALMIC
  Filled 2019-05-23: qty 5

## 2019-05-23 MED ORDER — AMLODIPINE BESYLATE 5 MG PO TABS
5.0000 mg | ORAL_TABLET | Freq: Every day | ORAL | Status: DC
  Administered 2019-05-23: 08:00:00 5 mg via ORAL
  Filled 2019-05-23: qty 1

## 2019-05-23 MED ORDER — ONDANSETRON HCL 4 MG/2ML IJ SOLN
4.0000 mg | Freq: Four times a day (QID) | INTRAMUSCULAR | Status: DC | PRN
  Administered 2019-05-23: 10:00:00 4 mg via INTRAVENOUS
  Filled 2019-05-23: qty 2

## 2019-05-23 MED ORDER — ACETAMINOPHEN 500 MG PO TABS: 1000.0000 mg | ORAL_TABLET | Freq: Four times a day (QID) | ORAL | Status: DC | PRN

## 2019-05-23 MED ORDER — ALLOPURINOL 100 MG PO TABS
100.0000 mg | ORAL_TABLET | Freq: Every day | ORAL | Status: DC
  Administered 2019-05-23: 08:00:00 100 mg via ORAL
  Filled 2019-05-23: qty 1

## 2019-05-23 MED ORDER — METOPROLOL TARTRATE 25 MG PO TABS
25.0000 mg | ORAL_TABLET | Freq: Every day | ORAL | Status: DC
  Administered 2019-05-23: 25 mg via ORAL
  Filled 2019-05-23: qty 1

## 2019-05-23 MED ORDER — TAMSULOSIN HCL 0.4 MG PO CAPS
0.4000 mg | ORAL_CAPSULE | Freq: Every day | ORAL | Status: DC
  Administered 2019-05-23: 01:00:00 0.4 mg via ORAL
  Filled 2019-05-23: qty 1

## 2019-05-23 MED ORDER — DORZOLAMIDE HCL-TIMOLOL MAL 2-0.5 % OP SOLN
2.0000 [drp] | Freq: Two times a day (BID) | OPHTHALMIC | Status: DC
  Administered 2019-05-23: 2 [drp] via OPHTHALMIC
  Filled 2019-05-23: qty 10

## 2019-05-23 NOTE — Consult Note (Signed)
Cardiology Consultation:   Patient ID: Trevor Andrews. MRN: [id number]; DOB: 26-May-1929  Admit date: 05/22/2019 Date of Consult: 05/23/2019  Primary Care Provider: Wilber Oliphant, MD Primary Cardiologist:  Primary Electrophysiologist:  Beckie Salts    Patient Profile:   Trevor Andrews. is a 86 y.o. male with a hx of atrial flutter who is being seen today for the evaluation of chest pain at the request of Dr Ardelia Mems    History of Present Illness:   Trevor Andrews  Is a 86  Yo wth hx of  CAD (s/p intervention at [facility], 2015  Pt does not remember angina to describe symptoms),  atrial flutter and CHB   He is s/p PPM in 2020   Seen in cardiology clinic by Beckie Salts on 05/01/19   Did note some DOE at that time  He was started on lasix 20 daily    This was increased to 40 mg daily on 05/06/19   The pt presented to [facility] yesterday  His first episode of chest pressure  was one day prior (Friday3/19) when walking in yard. REcurred yesterday (3/20) Laid down   Called 911  By time arrived it was gone   He does not describe as severe   Says he does get some bitter taste in mouth   Again, vague on this    Pt also with hx of chronic dizziness, hx of CVA , HTN, systolic /diastolic CHF   LVEF 40 to 45% in Oc 2020    Since seen has had no CP   Trop 9 and 9       Past Medical History:  Diagnosis Date  . Arthritis   . Blindness    due to glaucoma and cataract  . Cancer [facility]) 1998   Prostate. followed by urology  . CHF (congestive heart failure) ([place])   . CKD (chronic kidney disease), stage II   . Coronary artery disease    Had stent placed 2015???  . Dementia ([place])   . Diabetes mellitus ([place])   . GERD (gastroesophageal reflux disease)   . Hypercholesteremia   . Hypertension   . Shortness of breath dyspnea   . Stroke [facility]) 2016   a. on ASA/Plavix - felt 2/2 small vessel disease source per neuro notes.  . Vertigo     Past Surgical History:  Procedure Laterality Date  . CATARACT EXTRACTION   2011 and 2012   2011lt eye and rt eye 2012  . CORONARY ANGIOPLASTY WITH STENT PLACEMENT  2015???  . glauma implant  2012   rt.eye  . PACEMAKER IMPLANT N/A 01/18/2019   Procedure: PACEMAKER IMPLANT;  Surgeon: Evans Lance, MD;  Location: [place] [facility];  Service: Cardiovascular;  Laterality: N/A;  . PROSTATECTOMY  1998       Inpatient Medications: Scheduled Meds: . allopurinol  100 mg Oral Daily  . amLODipine  5 mg Oral Daily  . apixaban  5 mg Oral BID  . atorvastatin  40 mg Oral QHS  . brimonidine  1 drop Both Eyes BID  . dorzolamide-timolol  1 drop Both Eyes BID  . furosemide  40 mg Oral Daily  . metoprolol tartrate  25 mg Oral Daily  . prednisoLONE acetate  1 drop Both Eyes Daily  . tamsulosin  0.4 mg Oral QHS   Continuous Infusions:  PRN Meds: acetaminophen, ondansetron (ZOFRAN) IV  Allergies:   No Known Allergies  Social History:   Social History  Socioeconomic History  . Marital status: Married    Spouse name: Not on file  . Number of children: Not on file  . Years of education: Not on file  . Highest education level: Not on file  Occupational History  . Not on file  Tobacco Use  . Smoking status: Never Smoker  . Smokeless tobacco: Never Used  Substance and Sexual Activity  . Alcohol use: Yes    Alcohol/week: 2.0 standard drinks    Types: 2 Shots of liquor per week    Comment: rare  . Drug use: No  . Sexual activity: Not Currently  Other Topics Concern  . Not on file  Social History Narrative   Retired English as a second language teacher   Lives with daughter   Social Determinants of Health   Financial Resource Strain:   . Difficulty of Paying Living Expenses:   Food Insecurity:   . Worried About Charity fundraiser in the Last Year:   . Arboriculturist in the Last Year:   Transportation Needs:   . Film/video editor (Medical):   Marland Kitchen Lack of Transportation (Non-Medical):   Physical Activity:   . Days of Exercise per Week:   . Minutes of Exercise per Session:    Stress:   . Feeling of Stress :   Social Connections:   . Frequency of Communication with Friends and Family:   . Frequency of Social Gatherings with Friends and Family:   . Attends Religious Services:   . Active Member of Clubs or Organizations:   . Attends Archivist Meetings:   Marland Kitchen Marital Status:   Intimate Partner Violence:   . Fear of Current or Ex-Partner:   . Emotionally Abused:   Marland Kitchen Physically Abused:   . Sexually Abused:     Family History:    Family History  Problem Relation Age of Onset  . Premature CHD Neg Hx      ROS:  Please see the history of present illness.  All other ROS reviewed and negative.     Physical Exam/Data:   Vitals:   05/23/19 0525 05/23/19 0752 05/23/19 0953 05/23/19 1229  BP: (!) 144/81 119/77 120/77 124/82  Pulse: 63 93 60 60  Resp: 18 16 18 18   Temp: 98.3 F (36.8 C)  97.8 F (36.6 C) 98.6 F (37 C)  TempSrc: Oral Oral Oral Oral  SpO2: 99% 100% 99% 99%  Weight:      Height:        Intake/Output Summary (Last 24 hours) at 05/23/2019 1251 Last data filed at 05/23/2019 1211 Gross per 24 hour  Intake 480 ml  Output 500 ml  Net -20 ml   Last 3 Weights 05/23/2019 05/22/2019 04/21/2019  Weight (lbs) 207 lb 14.3 oz 215 lb 216 lb  Weight (kg) 94.3 kg 97.523 kg 97.977 kg     Body mass index is 27.06 kg/m.  General:  Well nourished, well developed, in no acute distress HEENT: normocephalic    Lymph: no adenopathy Neck: no JVD Endocrine:  No thryomegaly Vascular: No carotid bruits; 2+ DP  Cardiac:  normal S1, S2; RRR; no murmur  Distant HS   Chest:  Nontender   Lungs:  clear to auscultation bilaterally, no wheezing, rhonchi or rales  Abd: soft, nontender, no hepatomegaly  Ext: no LE  edema Musculoskeletal:  No deformities, BUE and BLE strength normal and equal Skin: warm and dry  Neuro:  Pt blind   Moving all extrem  Otherwise deferred  Psych:  Normal affect   EKG:  The EKG was personally reviewed and demonstrates:   Atrial flutter  With intermitt ventricluar pacing   Relevant CV Studies: October 2020 1. Left ventricular ejection fraction, by visual estimation, is 40 to 45%. The left ventricle has mild to moderately decreased function. Normal left ventricular size. There is moderately increased left ventricular hypertrophy. 2. Left ventricular diastolic function could not be evaluated pattern of LV diastolic filling. 3. Global right ventricle has normal systolic function.The right ventricular size is normal. 4. Left atrial size was normal. 5. Right atrial size was normal. 6. The mitral valve is normal in structure. Trace mitral valve regurgitation. No evidence of mitral stenosis. 7. The tricuspid valve is normal in structure. Tricuspid valve regurgitation is trivial. 8. The aortic valve is tricuspid Aortic valve regurgitation was not visualized by color flow Doppler. Mild to moderate aortic valve sclerosis/calcification without any evidence of aortic stenosis. 9. The pulmonic valve was not well visualized. Pulmonic valve regurgitation is not visualized by color flow Doppler. 10. Mildly elevated pulmonary artery systolic pressure. 11. Mild to moderate global reduction in LV systolic function; moderate LVH; pt in atrial flutter at time of study.  Laboratory Data:  High Sensitivity Troponin:   Recent Labs  Lab 05/22/19 1919 05/22/19 2042  TROPONINIHS 9 9     Chemistry Recent Labs  Lab 05/22/19 1919  NA 140  K 3.7  CL 108  CO2 25  GLUCOSE 95  BUN 12  CREATININE 1.11  CALCIUM 8.7*  GFRNONAA 59*  GFRAA >60  ANIONGAP 7    No results for input(s): PROT, ALBUMIN, AST, ALT, ALKPHOS, BILITOT in the last 168 hours. Hematology Recent Labs  Lab 05/22/19 1919  WBC 3.9*  RBC 4.97  HGB 14.3  HCT 44.4  MCV 89.3  MCH 28.8  MCHC 32.2  RDW 15.4  PLT 191   BNPNo results for input(s): BNP, PROBNP in the last 168 hours.  DDimer No results for input(s): DDIMER in the last 168  hours.   Radiology/Studies:  DG Chest Port 1 View  Result Date: 05/22/2019 CLINICAL DATA:  Shortness of breath EXAM: PORTABLE CHEST 1 VIEW COMPARISON:  01/19/2019 FINDINGS: Left pacer remains in place, unchanged. Heart is normal size. Mild elevation of the left hemidiaphragm. No confluent opacities or effusions. No acute bony abnormality. IMPRESSION: No active disease. Electronically Signed   By: Rolm Baptise M.D.   On: 05/22/2019 19:13   {  Assessment and Plan:   1  CP   Pt presents with 2 episodes of CP   He is vague on describing    Hx of CAD with intervention in 2015  He cannot remember what pain / angina was like at the time    Does have some reflux symptoms    Says he takes some Peptobismol  EKG is nondiagnostic due to pacing acitvity    Labs :  Trop 9 and 9      With no recurrent symptoms I would recomm ambulating     If does OK would d/c on PPI    WIll make sure he has f/u in clinic   2  Hx CAD   [facility] in [facility] at [facility]   Follow   3  Rhythym   Hx atrial flutter and CHB   Has pacer   On anticoagulation   4  Hx systolic CHF    LVEf 40 to 45% on echo in OCt 2020 Volume is pretty good on  exam   I would keep on same regimen   4  Hx GERD  As above     IF ambulates and does OK can go home     For questions or updates, please contact [place] Please consult [email or web address] for contact info under     Signed, Dorris Carnes, MD  05/23/2019 12:51 PM

## 2019-05-23 NOTE — Discharge Instructions (Signed)
You were admitted to the hospital for chest pain.  It does not appear to be coming from your heart.  We will start you on a medication for reflux.  You should follow-up with your cardiologist.  We have also schedule you an appointment at the family medicine center on 3/24 at 75.  Please go to this appointment.  If you have worsening of chest pain, dizziness, difficulty breathing you should be seen right away in the emergency room.   Nonspecific Chest Pain Chest pain can be caused by many different conditions. Some causes of chest pain can be life-threatening. These will require treatment right away. Serious causes of chest pain include:  Heart attack.  A tear in the body's main blood vessel.  Redness and swelling (inflammation) around your heart.  Blood clot in your lungs. Other causes of chest pain may not be so serious. These include:  Heartburn.  Anxiety or stress.  Damage to bones or muscles in your chest.  Lung infections. Chest pain can feel like:  Pain or discomfort in your chest.  Crushing, pressure, aching, or squeezing pain.  Burning or tingling.  Dull or sharp pain that is worse when you move, cough, or take a deep breath.  Pain or discomfort that is also felt in your back, neck, jaw, shoulder, or arm, or pain that spreads to any of these areas. It is hard to know whether your pain is caused by something that is serious or something that is not so serious. So it is important to see your doctor right away if you have chest pain. Follow these instructions at home: Medicines  Take over-the-counter and prescription medicines only as told by your doctor.  If you were prescribed an antibiotic medicine, take it as told by your doctor. Do not stop taking the antibiotic even if you start to feel better. Lifestyle   Rest as told by your doctor.  Do not use any products that contain nicotine or tobacco, such as cigarettes, e-cigarettes, and chewing tobacco. If you need  help quitting, ask your doctor.  Do not drink alcohol.  Make lifestyle changes as told by your doctor. These may include: ? Getting regular exercise. Ask your doctor what activities are safe for you. ? Eating a heart-healthy diet. A diet and nutrition specialist (dietitian) can help you to learn healthy eating options. ? Staying at a healthy weight. ? Treating diabetes or high blood pressure, if needed. ? Lowering your stress. Activities such as yoga and relaxation techniques can help. General instructions  Pay attention to any changes in your symptoms. Tell your doctor about them or any new symptoms.  Avoid any activities that cause chest pain.  Keep all follow-up visits as told by your doctor. This is important. You may need more testing if your chest pain does not go away. Contact a doctor if:  Your chest pain does not go away.  You feel depressed.  You have a fever. Get help right away if:  Your chest pain is worse.  You have a cough that gets worse, or you cough up blood.  You have very bad (severe) pain in your belly (abdomen).  You pass out (faint).  You have either of these for no clear reason: ? Sudden chest discomfort. ? Sudden discomfort in your arms, back, neck, or jaw.  You have shortness of breath at any time.  You suddenly start to sweat, or your skin gets clammy.  You feel sick to your stomach (nauseous).  You throw up (vomit).  You suddenly feel lightheaded or dizzy.  You feel very weak or tired.  Your heart starts to beat fast, or it feels like it is skipping beats. These symptoms may be an emergency. Do not wait to see if the symptoms will go away. Get medical help right away. Call your local emergency services (911 in the [place] Do not drive yourself to the hospital. Summary  Chest pain can be caused by many different conditions. The cause may be serious and need treatment right away. If you have chest pain, see your doctor right  away.  Follow your doctor's instructions for taking medicines and making lifestyle changes.  Keep all follow-up visits as told by your doctor. This includes visits for any further testing if your chest pain does not go away.  Be sure to know the signs that show that your condition has become worse. Get help right away if you have these symptoms. This information is not intended to replace advice given to you by your health care provider. Make sure you discuss any questions you have with your health care provider. Document Revised: 08/21/2017 Document Reviewed: 08/21/2017 Elsevier Patient Education  2020 [employer].

## 2019-05-23 NOTE — Discharge Summary (Signed)
[facility] Discharge Summary  Patient name: Trevor Andrews. Medical record number: [id number] Date of birth: 12-Mar-1929 Age: 86 y.o. Gender: male Date of Admission: 05/22/2019  Date of Discharge: 05/23/2019    Admitting Physician: Leeanne Rio, MD  Primary Care Provider: Wilber Oliphant, MD Consultants: IP CONSULT TO FAMILY PRACTICE  Indication for Hospitalization: <principal problem not specified>   Discharge Diagnoses/Problem List:  Active Problems:   Hypertension   Dementia [facility])   Cerebral infarction due to thrombosis of right middle cerebral artery (HCC)   Essential hypertension   HLD (hyperlipidemia)   Dizziness   CKD (chronic kidney disease), stage III   Chronic diastolic CHF (congestive heart failure) (HCC)   Chest pain at rest   Open-angle glaucoma   History of prostate cancer   Atrial flutter ([place])   Heart block AV complete ([place])   Pacemaker   Chest pain  Disposition: Discharge to home  Discharge Condition: Stable  Discharge Exam:  BP 129/76 (BP Location: Right Arm)   Pulse 60   Temp 98.1 F (36.7 C) (Oral)   Resp 18   Ht 6' 1.5" (1.867 m)   Wt 94.3 kg   SpO2 100%   BMI 27.06 kg/m    Physical Exam: General: NAD, non-toxic, well-appearing, sitting comfortably in bed eating breakfast    Cardiovascular: irregular, no BLEE Respiratory: No IWOB.  Abdomen:  NT, ND, soft to palpation.  Extremities: Warm and well perfused. Moving spontaneously.  Integumentary: No obvious rashes, lesions, trauma on general exam. Neuro: CN grossly intact. No FND. Gait not assessed  Brief Hospital Course:  Trevor Andrews.is a 86 y.o.malepresenting with dizziness and chest pain. PMH is significant fora flutter, complete heart block status post pacemaker, CVA of MCA with residualverymild left-sided weakness, dementia, hypertension, CKD stage III, open-angle glaucoma, HLD,CHF.   His hospital course is outlined below.  Chest  pain Patient was admitted to the hospital with chest pain.  Admission details can be found in H&P.  Troponins trended 9 and 9.  EKG without acute changes, did show atrial flutter.  Given patient's significant cardiac history, cardiology was consulted.  Recommended patient ambulate and if no symptoms with this, discharged with PPI.  Patient was able to ambulate 250 feet without chest pain.  Vital signs remained stable throughout his hospitalization.  He was without complaint at the time of discharge.  He was discharged home on omeprazole.  Cardiology noted that they would ensure he has follow-up with their office.  Patient's home medications were continued during admission and his other chronic medical issues were addressed as needed without making changes to his home regimen.  Issues for Follow Up:  1. Ensure follow-up cardiology 2. Patient was started on omeprazole while inpatient.  Assess if this is helped with his chest pain.  Significant Procedures:   Procedure Orders     EKG 12-Lead     EKG 12-lead     EKG 12-Lead     EKG 12-Lead  Significant Labs and Imaging:  Recent Labs  Lab 05/22/19 1919  WBC 3.9*  HGB 14.3  HCT 44.4  PLT 191   Recent Labs  Lab 05/22/19 1919  NA 140  K 3.7  CL 108  CO2 25  GLUCOSE 95  BUN 12  CREATININE 1.11  CALCIUM 8.7*    DG Chest Port 1 View  Result Date: 05/22/2019 CLINICAL DATA:  Shortness of breath EXAM: PORTABLE CHEST 1 VIEW COMPARISON:  01/19/2019 FINDINGS: Left  pacer remains in place, unchanged. Heart is normal size. Mild elevation of the left hemidiaphragm. No confluent opacities or effusions. No acute bony abnormality. IMPRESSION: No active disease. Electronically Signed   By: Trevor Andrews M.D.   On: 05/22/2019 19:13    Discharge Medications:  Allergies as of 05/23/2019   No Known Allergies     Medication List    TAKE these medications   acetaminophen 500 MG tablet Commonly known as: TYLENOL Take 1,000 mg by mouth every 6  (six) hours as needed (pain).   allopurinol 100 MG tablet Commonly known as: ZYLOPRIM Take 100 mg by mouth daily.   amLODipine 5 MG tablet Commonly known as: NORVASC Take 1 tablet (5 mg total) by mouth daily.   apixaban 5 MG Tabs tablet Commonly known as: ELIQUIS Take 1 tablet (5 mg total) by mouth 2 (two) times daily. Start 01/24/19 evening dose   atorvastatin 40 MG tablet Commonly known as: LIPITOR Take 40 mg by mouth at bedtime.   brimonidine 0.1 % Soln Commonly known as: ALPHAGAN P Place 1 drop into both eyes 2 (two) times daily.   dorzolamide-timolol 22.3-6.8 MG/ML ophthalmic solution Commonly known as: COSOPT Place 1 drop into both eyes Twice daily.   furosemide 20 MG tablet Commonly known as: LASIX Take 1 tablet (20 mg total) by mouth daily. What changed: how much to take   metoprolol tartrate 50 MG tablet Commonly known as: LOPRESSOR Take 25 mg by mouth daily.   omeprazole 20 MG capsule Commonly known as: PriLOSEC Take 1 capsule (20 mg total) by mouth daily.   prednisoLONE acetate 1 % ophthalmic suspension Commonly known as: PRED FORTE Place 1 drop into both eyes daily.   tamsulosin 0.4 MG Caps capsule Commonly known as: FLOMAX Take 0.4 mg by mouth at bedtime.       Discharge Instructions: Please refer to Patient Instructions section of EMR for full details.  Patient was counseled important signs and symptoms that should prompt return to medical care, changes in medications, dietary instructions, activity restrictions, and follow up appointments.   Follow-Up Appointments: Future Appointments  Date Time Provider [place]  05/26/2019  9:30 AM ACCESS TO CARE POOL [facility]  07/22/2019  7:10 AM [facility] DEVICE REMOTES [facility]  10/21/2019  7:10 AM [facility] DEVICE REMOTES [facility]  01/20/2020  7:10 AM [facility] DEVICE REMOTES [facility]    Taryn Nave, Bernita Raisin, DO 05/23/2019, 4:01  PM PGY-2, [employer]

## 2019-05-23 NOTE — Progress Notes (Addendum)
Family Medicine Teaching Service Daily Progress Note Intern Pager: [phone]  Patient name: Trevor Andrews. Medical record number: [id number] Date of birth: 04-13-1929 Age: 86 y.o. Gender: male  Primary Care Provider: Wilber Oliphant, MD Consultants: IP CONSULT TO FAMILY PRACTICE Code Status: Full Code   Pt Overview and Major Events to Date:  Hospital Day: 2 05/22/2019: admitted for Pacemaker problem/brady/tachy   Assessment and Plan: Cloise Cambareri. is a 86 y.o. male presenting with dizziness and chest pain. PMH is significant for a flutter, complete heart block status post pacemaker, CVA of MCA with residual very mild left-sided weakness, dementia, hypertension, CKD stage III, open-angle glaucoma, HLD.,  CHF  Chest pain, rule out ACS Patient denies any chest pain overnight. Troponin 9 & 9.  Will consult cardiology this morning for recommendations while patient is hospitalized.  AM EKG pending   Tele   Cards consult pending  Chronic dizziness, headaches, Hx of CVA  No episodes overnight.  Out of bed with assistance  Continue to follow outpatient   CHB, a flutter, s/p pacemaker  No acute events on telemetry overnight.  Previous progress note at cardiology contemplating DCCV.  Consult cardiology in the morning.  Continue home medications: Eliquis twice daily, metoprolol  HTN Blood pressure 144/81 this morning  Continue home medications: Amlodipine, metoprolol, Lasix 40 mg  Diastolic heart failure, HFrEF Last echo obtained 12/17/2018 showing EF 40 to 45% and mildly to moderately decreased function.  Mild LVH normal valves, mildly elevated pulmonary artery pressure, global LV systolic function reduction. No SOB today, lungs clear and no JVD or BLEE.   Continue home medications: lasix 40 mg  CKD 3 GFR greater than 60, creatinine 1.11  Avoid nephrotoxic medications  BPH, hx prostate cancer   Continue home flomax  HLD:  Continue home  atorvastatin  Glaucoma  Continue home meds: cosopt, alphagan, pred forte  Gout  Continue home meds: allopurinol   Rheumatoid arthritis  Continue home acetominophen PRN  FEN/GI: P.o., heart healthy Prophylaxis: Eliquis twice daily  Disposition: possible d/c today pending cardiology recommendations    Subjective:  NAEO.   Objective: Temp:  [98.3 F (36.8 C)-98.5 F (36.9 C)] 98.3 F (36.8 C) (03/21 0525) Pulse Rate:  [39-63] 63 (03/21 0525) Cardiac Rhythm: Atrial fibrillation;Ventricular paced (03/21 0050) Resp:  [12-20] 18 (03/21 0525) BP: (118-144)/(75-94) 144/81 (03/21 0525) SpO2:  [97 %-100 %] 99 % (03/21 0525) Weight:  [94.3 kg-97.5 kg] 94.3 kg (03/21 0049) Intake/Output      03/20 0701 - 03/21 0700   Urine (mL/kg/hr) 300   Total Output 300   Net -300           Physical Exam: General: NAD, non-toxic, well-appearing, sitting comfortably in bed eating breakfast    Cardiovascular: irregular, no BLEE Respiratory: No IWOB.  Abdomen:  NT, ND, soft to palpation.  Extremities: Warm and well perfused. Moving spontaneously.  Integumentary: No obvious rashes, lesions, trauma on general exam. Neuro: CN grossly intact. No FND. Gait not assessed  Laboratory: I have personally read and reviewed all labs and imaging studies.  CBC: Recent Labs  Lab 05/22/19 1919  WBC 3.9*  NEUTROABS 1.9  HGB 14.3  HCT 44.4  MCV 89.3  PLT 191   CMP: Recent Labs  Lab 05/22/19 1919  NA 140  K 3.7  CL 108  CO2 25  GLUCOSE 95  BUN 12  CREATININE 1.11  CALCIUM 8.7*   CBG: No results for input(s): GLUCAP in the last 168  hours. Micro: Covid Negative  Recent Results (from the past 240 hour(s))  Respiratory Panel by RT PCR (Flu A&B, Covid) - Nasopharyngeal Swab     Status: None   Collection Time: 05/22/19  8:51 PM   Specimen: Nasopharyngeal Swab  Result Value Ref Range Status   SARS Coronavirus 2 by RT PCR NEGATIVE NEGATIVE Final    Comment: (NOTE) SARS-CoV-2 target  nucleic acids are NOT DETECTED. The SARS-CoV-2 RNA is generally detectable in upper respiratoy specimens during the acute phase of infection. The lowest concentration of SARS-CoV-2 viral copies this assay can detect is 131 copies/mL. A negative result does not preclude SARS-Cov-2 infection and should not be used as the sole basis for treatment or other patient management decisions. A negative result may occur with  improper specimen collection/handling, submission of specimen other than nasopharyngeal swab, presence of viral mutation(s) within the areas targeted by this assay, and inadequate number of viral copies (<131 copies/mL). A negative result must be combined with clinical observations, patient history, and epidemiological information. The expected result is Negative. Fact Sheet for Patients:  PinkCheek.be Fact Sheet for Healthcare Providers:  GravelBags.it This test is not yet ap proved or cleared by the [place] FDA and  has been authorized for detection and/or diagnosis of SARS-CoV-2 by FDA under an Emergency Use Authorization (EUA). This EUA will remain  in effect (meaning this test can be used) for the duration of the COVID-19 declaration under Section 564(b)(1) of the Act, 21 U.S.C. section 360bbb-3(b)(1), unless the authorization is terminated or revoked sooner.    Influenza A by PCR NEGATIVE NEGATIVE Final   Influenza B by PCR NEGATIVE NEGATIVE Final    Comment: (NOTE) The Xpert Xpress SARS-CoV-2/FLU/RSV assay is intended as an aid in  the diagnosis of influenza from Nasopharyngeal swab specimens and  should not be used as a sole basis for treatment. Nasal washings and  aspirates are unacceptable for Xpert Xpress SARS-CoV-2/FLU/RSV  testing. Fact Sheet for Patients: PinkCheek.be Fact Sheet for Healthcare Providers: GravelBags.it This test is not  yet approved or cleared by the [place] FDA and  has been authorized for detection and/or diagnosis of SARS-CoV-2 by  FDA under an Emergency Use Authorization (EUA). This EUA will remain  in effect (meaning this test can be used) for the duration of the  Covid-19 declaration under Section 564(b)(1) of the Act, 21  U.S.C. section 360bbb-3(b)(1), unless the authorization is  terminated or revoked. Performed at [facility], [address], [place], [place] [address]      Imaging/Diagnostic Tests: DG Chest Port 1 View  Result Date: 05/22/2019 CLINICAL DATA:  Shortness of breath EXAM: PORTABLE CHEST 1 VIEW COMPARISON:  01/19/2019 FINDINGS: Left pacer remains in place, unchanged. Heart is normal size. Mild elevation of the left hemidiaphragm. No confluent opacities or effusions. No acute bony abnormality. IMPRESSION: No active disease. Electronically Signed   By: Rolm Baptise M.D.   On: 05/22/2019 19:13    Wilber Oliphant, MD 05/23/2019, 6:25 AM PGY-2, [place] Intern pager: [phone], text pages welcome

## 2019-05-23 NOTE — Progress Notes (Signed)
Pt discharge education went over at bedside with pt  All questions answered  Pt has all belongings including cane  Pt IV removed, catheter intact and telemetry removed Awaiting pt discharge transportation

## 2019-05-23 NOTE — Progress Notes (Signed)
Pt discharged via wheelchair with RN  

## 2019-05-23 NOTE — Progress Notes (Signed)
Pt ambulated 250 feet with RN guidance and cane  Pt tolerated well  Pt denies chest pain  Pt sitting at edge of bed now  Informed cardiology  Will continue to monitor

## 2019-05-23 NOTE — Progress Notes (Signed)
Paged MD to inform of EKG results  Vital signs documented Pt denies chest pain and shortness of breath MD aware  1000 am medications given  Call bell in pt reach, educated to call staff if symptomatic  Will continue to monitor

## 2019-05-24 NOTE — Telephone Encounter (Signed)
Pt recent hospitalization.  Pt will continue on lasix 40 mg daily.  Pt will be scheduled for follow up after hospital discharge.

## 2019-05-25 NOTE — Progress Notes (Signed)
    SUBJECTIVE:   CHIEF COMPLAINT / HPI:   Hospital f/u  Chest pain Patient admitted from 3/20 - 3/21 for chest pain. initially presented with 2 episodes of chest pain. Heart Score 6 on presentation. Trops trended at 9>9. EKG without acute changes. Cardiology was consulted. At time of dc patient was able to ambulate without chest pain. Was advised to start PPI on discharge to see if there was improvement of pain. Was advised to f/u with cardiology as an outpatient.   Patient reports he overall is doing well after dc. States he feels improved. Does have a h/o pacemaker but was not interogated in the hospital. Denies current chest pain. Has been compliant on omeprazole. Reports some chronic SOB which has been occurring since after his stroke. Denies edema.    PERTINENT  PMH / PSH: HTN, complete heart block, CHF, a flutter, pacemaker  OBJECTIVE:   BP 108/62   Pulse (!) 109   Ht 6' 1.5" (1.867 m)   Wt 212 lb 12.8 oz (96.5 kg)   SpO2 99%   BMI 27.69 kg/m   Gen: awake and alert, NAD Cardio: RRR, no MRG Resp: CTAB, no wheezes, rales, or rhonchi GI: soft, non tender, non distended, bowel sounds present Ext: no edema  ASSESSMENT/PLAN:   Chest pain Improved after admission.  Patient denies any current chest pain.  Has been taking omeprazole daily and states improvement.  Still has pills at home so does not need refill at this time.  I was able to call his cardiologist and schedule an appointment.  This was scheduled for 1 week's time.  Daughter also in room to ensure compliance with appointment.  Strict return precautions given.  Follow-up with PCP PRN, ED precautions discussed.     Caroline More, [place]

## 2019-05-26 ENCOUNTER — Ambulatory Visit (INDEPENDENT_AMBULATORY_CARE_PROVIDER_SITE_OTHER): Payer: Medicare Other | Admitting: Family Medicine

## 2019-05-26 ENCOUNTER — Other Ambulatory Visit: Payer: Self-pay

## 2019-05-26 DIAGNOSIS — R079 Chest pain, unspecified: Secondary | ICD-10-CM | POA: Diagnosis not present

## 2019-05-26 NOTE — Assessment & Plan Note (Signed)
Improved after admission.  Patient denies any current chest pain.  Has been taking omeprazole daily and states improvement.  Still has pills at home so does not need refill at this time.  I was able to call his cardiologist and schedule an appointment.  This was scheduled for 1 week's time.  Daughter also in room to ensure compliance with appointment.  Strict return precautions given.  Follow-up with PCP PRN, ED precautions discussed.

## 2019-05-26 NOTE — Patient Instructions (Signed)
It was a pleasure seeing you today. I am glad your chest pain is better.   Your appointment with cardiology is on 06/01/19 @ 12:30PM.   Please follow up with your PCP as needed. Follow up if worsening symptoms. Go to the ED with Chest pain or new shortness of breath.   Dr. Tammi Klippel

## 2019-06-01 ENCOUNTER — Other Ambulatory Visit: Payer: Self-pay

## 2019-06-01 ENCOUNTER — Ambulatory Visit (INDEPENDENT_AMBULATORY_CARE_PROVIDER_SITE_OTHER): Payer: Medicare Other | Admitting: Student

## 2019-06-01 ENCOUNTER — Encounter: Payer: Self-pay | Admitting: Student

## 2019-06-01 VITALS — BP 122/70 | HR 59 | Ht 73.5 in | Wt 217.0 lb

## 2019-06-01 DIAGNOSIS — I442 Atrioventricular block, complete: Secondary | ICD-10-CM

## 2019-06-01 DIAGNOSIS — I484 Atypical atrial flutter: Secondary | ICD-10-CM | POA: Diagnosis not present

## 2019-06-01 DIAGNOSIS — I10 Essential (primary) hypertension: Secondary | ICD-10-CM | POA: Diagnosis not present

## 2019-06-01 LAB — CUP PACEART INCLINIC DEVICE CHECK
Battery Remaining Longevity: 122 mo
Battery Voltage: 3.02 V
Brady Statistic RA Percent Paced: 0 %
Brady Statistic RV Percent Paced: 59 %
Date Time Interrogation Session: 20210330131224
Implantable Lead Implant Date: 20201116
Implantable Lead Implant Date: 20201116
Implantable Lead Location: 753859
Implantable Lead Location: 753860
Implantable Pulse Generator Implant Date: 20201116
Lead Channel Impedance Value: 525 Ohm
Lead Channel Impedance Value: 812.5 Ohm
Lead Channel Pacing Threshold Amplitude: 0.5 V
Lead Channel Pacing Threshold Amplitude: 0.5 V
Lead Channel Pacing Threshold Pulse Width: 0.5 ms
Lead Channel Pacing Threshold Pulse Width: 0.5 ms
Lead Channel Sensing Intrinsic Amplitude: 1 mV
Lead Channel Sensing Intrinsic Amplitude: 6.1 mV
Lead Channel Setting Pacing Amplitude: 2 V
Lead Channel Setting Pacing Amplitude: 2.5 V
Lead Channel Setting Pacing Pulse Width: 0.5 ms
Lead Channel Setting Sensing Sensitivity: 0.7 mV
Pulse Gen Model: 2272
Pulse Gen Serial Number: 9171567

## 2019-06-01 NOTE — Progress Notes (Signed)
Electrophysiology Office Note Date: 06/01/2019  ID:  Ahsan Salom., DOB 06-Sep-1929, MRN [id number]  PCP: Wilber Oliphant, MD Primary Cardiologist: No primary care provider on file. Electrophysiologist: Cristopher Peru, MD   CC: Pacemaker follow-up  Sandler Blodgett. is a 86 y.o. male seen today for Dr. Lovena Le . he presents today for post hospital follow up.  Seen at [facility] 3/21 for chest pain. Troponins negative and EKG without acute changes; known AFL. He was discharged on a PPI that has helped his pain tremendously. He has had no further pain since discharge. He is currently doing very well. He denies chest pain, palpitations, dyspnea, PND, orthopnea, nausea, vomiting, dizziness, syncope, edema, weight gain, or early satiety.  Device History: St. Jude Dual Chamber PPM implanted 01/18/2019 for symptomatic bradycardia/transient CHB  Past Medical History:  Diagnosis Date  . Arthritis   . Blindness    due to glaucoma and cataract  . Cancer [facility]) 1998   Prostate. followed by urology  . CHF (congestive heart failure) ([place])   . CKD (chronic kidney disease), stage II   . Coronary artery disease    Had stent placed 2015???  . Dementia ([place])   . Diabetes mellitus ([place])   . GERD (gastroesophageal reflux disease)   . Hypercholesteremia   . Hypertension   . Shortness of breath dyspnea   . Stroke [facility]) 2016   a. on ASA/Plavix - felt 2/2 small vessel disease source per neuro notes.  . Vertigo    Past Surgical History:  Procedure Laterality Date  . CATARACT EXTRACTION  2011 and 2012   2011lt eye and rt eye 2012  . CORONARY ANGIOPLASTY WITH STENT PLACEMENT  2015???  . glauma implant  2012   rt.eye  . PACEMAKER IMPLANT N/A 01/18/2019   Procedure: PACEMAKER IMPLANT;  Surgeon: Evans Lance, MD;  Location: [facility];  Service: Cardiovascular;  Laterality: N/A;  . PROSTATECTOMY  1998    Current Outpatient Medications  Medication Sig Dispense Refill  . acetaminophen (TYLENOL)  500 MG tablet Take 1,000 mg by mouth every 6 (six) hours as needed (pain).    Marland Kitchen allopurinol (ZYLOPRIM) 100 MG tablet Take 100 mg by mouth daily.     Marland Kitchen amLODipine (NORVASC) 5 MG tablet Take 1 tablet (5 mg total) by mouth daily. 90 tablet 3  . apixaban (ELIQUIS) 5 MG TABS tablet Take 1 tablet (5 mg total) by mouth 2 (two) times daily. Start 01/24/19 evening dose 60 tablet 3  . atorvastatin (LIPITOR) 40 MG tablet Take 40 mg by mouth at bedtime.     . brimonidine (ALPHAGAN P) 0.1 % SOLN Place 1 drop into both eyes 2 (two) times daily.     . dorzolamide-timolol (COSOPT) 22.3-6.8 MG/ML ophthalmic solution Place 1 drop into both eyes Twice daily.     . furosemide (LASIX) 20 MG tablet Take 40 mg by mouth daily.    . metoprolol tartrate (LOPRESSOR) 50 MG tablet Take 25 mg by mouth daily.    Marland Kitchen omeprazole (PRILOSEC) 20 MG capsule Take 1 capsule (20 mg total) by mouth daily. 30 capsule 0  . prednisoLONE acetate (PRED FORTE) 1 % ophthalmic suspension Place 1 drop into both eyes daily.     . tamsulosin (FLOMAX) 0.4 MG CAPS capsule Take 0.4 mg by mouth at bedtime.     No current facility-administered medications for this visit.    Allergies:   Patient has no known allergies.   Social History: Social  History   Socioeconomic History  . Marital status: Married    Spouse name: Not on file  . Number of children: Not on file  . Years of education: Not on file  . Highest education level: Not on file  Occupational History  . Not on file  Tobacco Use  . Smoking status: Never Smoker  . Smokeless tobacco: Never Used  Substance and Sexual Activity  . Alcohol use: Yes    Alcohol/week: 2.0 standard drinks    Types: 2 Shots of liquor per week    Comment: rare  . Drug use: No  . Sexual activity: Not Currently  Other Topics Concern  . Not on file  Social History Narrative   Retired English as a second language teacher   Lives with daughter   Social Determinants of Health   Financial Resource Strain:   . Difficulty of Paying  Living Expenses:   Food Insecurity:   . Worried About Charity fundraiser in the Last Year:   . Arboriculturist in the Last Year:   Transportation Needs:   . Film/video editor (Medical):   Marland Kitchen Lack of Transportation (Non-Medical):   Physical Activity:   . Days of Exercise per Week:   . Minutes of Exercise per Session:   Stress:   . Feeling of Stress :   Social Connections:   . Frequency of Communication with Friends and Family:   . Frequency of Social Gatherings with Friends and Family:   . Attends Religious Services:   . Active Member of Clubs or Organizations:   . Attends Archivist Meetings:   Marland Kitchen Marital Status:   Intimate Partner Violence:   . Fear of Current or Ex-Partner:   . Emotionally Abused:   Marland Kitchen Physically Abused:   . Sexually Abused:    Family History: Family History  Problem Relation Age of Onset  . Premature CHD Neg Hx    Review of Systems: All other systems reviewed and are otherwise negative except as noted above.  Physical Exam: Vitals:   06/01/19 1223  BP: 122/70  Pulse: (!) 59  SpO2: 99%  Weight: 217 lb (98.4 kg)  Height: 6' 1.5" (1.867 m)    GEN- The patient is well appearing, alert and oriented x 3 today.   HEENT: normocephalic, atraumatic; sclera clear, conjunctiva pink; hearing intact; oropharynx clear; neck supple  Lungs- Clear to ausculation bilaterally, normal work of breathing.  No wheezes, rales, rhonchi Heart- Regular rate and rhythm, no murmurs, rubs or gallops  GI- soft, non-tender, non-distended, bowel sounds present  Extremities- no clubbing, cyanosis, or edema  MS- no significant deformity or atrophy Skin- warm and dry, no rash or lesion; PPM pocket well healed Psych- euthymic mood, full affect Neuro- strength and sensation are intact  PPM Interrogation- reviewed in detail today,  See PACEART report  EKG:  EKG is not ordered today. The ekg ordered 05/23/19 shows Atrial flutter with controlled ventricular rates in the  60s  Recent Labs: 09/05/2018: ALT 14 11/27/2018: B Natriuretic Peptide 98.4; Magnesium 2.1 05/22/2019: BUN 12; Creatinine, Ser 1.11; Hemoglobin 14.3; Platelets 191; Potassium 3.7; Sodium 140   Wt Readings from Last 3 Encounters:  06/01/19 217 lb (98.4 kg)  05/26/19 212 lb 12.8 oz (96.5 kg)  05/23/19 207 lb 14.3 oz (94.3 kg)     Other studies Reviewed: Additional studies/ records that were reviewed today include: Echo 12/2018 shows LVEF 40-45%, Previous EP office notes, Previous remote checks, Most recent labwork.   Assessment and Plan:  1. CHB s/p St. Jude PPM  Normal PPM function See Pace Art report No changes today  2. HTN Continue current medications  3. AF/Flutter Remains in AFL today. Rate controlled.  Continue eliquis for CHA2DS2VASC of 5    4. Atypical chest pain Cardiac work up unremarkable. Suspect GI with relief from addition of PPI.  No further work up indicated at this time.   Current medicines are reviewed at length with the patient today.   The patient does not have concerns regarding his medicines.  The following changes were made today:  none  Labs/ tests ordered today include:  Orders Placed This Encounter  Procedures  . CUP PACEART INCLINIC DEVICE CHECK   Disposition:   Follow up with Dr. Lovena Le in 12 Months   Signed, Shirley Friar, PA-C  06/01/2019 1:35 PM  [place] [id number] [phone] (office) [phone] (fax)

## 2019-06-01 NOTE — Patient Instructions (Signed)
Medication Instructions:  none *If you need a refill on your cardiac medications before your next appointment, please call your pharmacy*   Lab Work: none If you have labs (blood work) drawn today and your tests are completely normal, you will receive your results only by: Marland Kitchen MyChart Message (if you have MyChart) OR . A paper copy in the mail If you have any lab test that is abnormal or we need to change your treatment, we will call you to review the results.   Testing/Procedures: none   Follow-Up: At [facility], you and your health needs are our priority.  As part of our continuing mission to provide you with exceptional heart care, we have created designated Provider Care Teams.  These Care Teams include your primary Cardiologist (physician) and Advanced Practice Providers (APPs -  Physician Assistants and Nurse Practitioners) who all work together to provide you with the care you need, when you need it.    Other Instructions Remote monitoring is used to monitor your Pacemaker from home. This monitoring reduces the number of office visits required to check your device to one time per year. It allows [place] to keep an eye on the functioning of your device to ensure it is working properly. You are scheduled for a device check from home on 5/20. You may send your transmission at any time that day. If you have a wireless device, the transmission will be sent automatically. After your physician reviews your transmission, you will receive a postcard with your next transmission date.

## 2019-06-10 ENCOUNTER — Other Ambulatory Visit: Payer: Self-pay | Admitting: Family Medicine

## 2019-06-11 ENCOUNTER — Other Ambulatory Visit: Payer: Self-pay | Admitting: Family Medicine

## 2019-06-14 MED ORDER — FUROSEMIDE 20 MG PO TABS: 40.0000 mg | ORAL_TABLET | Freq: Every day | ORAL | 11 refills | Status: AC

## 2019-07-22 ENCOUNTER — Ambulatory Visit (INDEPENDENT_AMBULATORY_CARE_PROVIDER_SITE_OTHER): Payer: Medicare Other | Admitting: *Deleted

## 2019-07-22 DIAGNOSIS — I442 Atrioventricular block, complete: Secondary | ICD-10-CM

## 2019-07-22 LAB — CUP PACEART REMOTE DEVICE CHECK
Battery Remaining Longevity: 116 mo
Battery Remaining Percentage: 95.5 %
Battery Voltage: 3.01 V
Brady Statistic AP VP Percent: 0 %
Brady Statistic AP VS Percent: 0 %
Brady Statistic AS VP Percent: 72 %
Brady Statistic AS VS Percent: 22 %
Brady Statistic RA Percent Paced: 0 %
Brady Statistic RV Percent Paced: 56 %
Date Time Interrogation Session: 20210520020019
Implantable Lead Implant Date: 20201116
Implantable Lead Implant Date: 20201116
Implantable Lead Location: 753859
Implantable Lead Location: 753860
Implantable Pulse Generator Implant Date: 20201116
Lead Channel Impedance Value: 490 Ohm
Lead Channel Impedance Value: 790 Ohm
Lead Channel Pacing Threshold Amplitude: 0.5 V
Lead Channel Pacing Threshold Pulse Width: 0.5 ms
Lead Channel Sensing Intrinsic Amplitude: 0.8 mV
Lead Channel Sensing Intrinsic Amplitude: 7.2 mV
Lead Channel Setting Pacing Amplitude: 2 V
Lead Channel Setting Pacing Amplitude: 2.5 V
Lead Channel Setting Pacing Pulse Width: 0.5 ms
Lead Channel Setting Sensing Sensitivity: 0.7 mV
Pulse Gen Model: 2272
Pulse Gen Serial Number: 9171567

## 2019-07-26 NOTE — Progress Notes (Signed)
Remote pacemaker transmission.   

## 2019-10-21 ENCOUNTER — Ambulatory Visit (INDEPENDENT_AMBULATORY_CARE_PROVIDER_SITE_OTHER): Payer: Medicare Other | Admitting: *Deleted

## 2019-10-21 DIAGNOSIS — I442 Atrioventricular block, complete: Secondary | ICD-10-CM | POA: Diagnosis not present

## 2019-10-21 LAB — CUP PACEART REMOTE DEVICE CHECK
Battery Remaining Longevity: 113 mo
Battery Remaining Percentage: 95.5 %
Battery Voltage: 3.01 V
Brady Statistic AP VP Percent: 0 %
Brady Statistic AP VS Percent: 0 %
Brady Statistic AS VP Percent: 74 %
Brady Statistic AS VS Percent: 20 %
Brady Statistic RA Percent Paced: 0 %
Brady Statistic RV Percent Paced: 59 %
Date Time Interrogation Session: 20210819020013
Implantable Lead Implant Date: 20201116
Implantable Lead Implant Date: 20201116
Implantable Lead Location: 753859
Implantable Lead Location: 753860
Implantable Pulse Generator Implant Date: 20201116
Lead Channel Impedance Value: 450 Ohm
Lead Channel Impedance Value: 710 Ohm
Lead Channel Pacing Threshold Amplitude: 0.5 V
Lead Channel Pacing Threshold Pulse Width: 0.5 ms
Lead Channel Sensing Intrinsic Amplitude: 0.9 mV
Lead Channel Sensing Intrinsic Amplitude: 6.6 mV
Lead Channel Setting Pacing Amplitude: 2 V
Lead Channel Setting Pacing Amplitude: 2.5 V
Lead Channel Setting Pacing Pulse Width: 0.5 ms
Lead Channel Setting Sensing Sensitivity: 0.7 mV
Pulse Gen Model: 2272
Pulse Gen Serial Number: 9171567

## 2019-10-25 NOTE — Progress Notes (Signed)
Remote pacemaker transmission.   

## 2020-04-20 ENCOUNTER — Ambulatory Visit (INDEPENDENT_AMBULATORY_CARE_PROVIDER_SITE_OTHER): Payer: Medicare Other

## 2020-04-20 DIAGNOSIS — I442 Atrioventricular block, complete: Secondary | ICD-10-CM

## 2020-04-21 LAB — CUP PACEART REMOTE DEVICE CHECK
Battery Remaining Longevity: 119 mo
Battery Remaining Percentage: 95.5 %
Battery Voltage: 3.01 V
Brady Statistic AP VP Percent: 1 %
Brady Statistic AP VS Percent: 0 %
Brady Statistic AS VP Percent: 70 %
Brady Statistic AS VS Percent: 18 %
Brady Statistic RA Percent Paced: 0 %
Brady Statistic RV Percent Paced: 59 %
Date Time Interrogation Session: 20220217020015
Implantable Lead Implant Date: 20201116
Implantable Lead Implant Date: 20201116
Implantable Lead Location: 753859
Implantable Lead Location: 753860
Implantable Pulse Generator Implant Date: 20201116
Lead Channel Impedance Value: 460 Ohm
Lead Channel Impedance Value: 730 Ohm
Lead Channel Pacing Threshold Amplitude: 0.5 V
Lead Channel Pacing Threshold Pulse Width: 0.5 ms
Lead Channel Sensing Intrinsic Amplitude: 0.9 mV
Lead Channel Sensing Intrinsic Amplitude: 7 mV
Lead Channel Setting Pacing Amplitude: 2 V
Lead Channel Setting Pacing Amplitude: 2.5 V
Lead Channel Setting Pacing Pulse Width: 0.5 ms
Lead Channel Setting Sensing Sensitivity: 0.7 mV
Pulse Gen Model: 2272
Pulse Gen Serial Number: 9171567

## 2020-04-26 NOTE — Progress Notes (Signed)
Remote pacemaker transmission.   

## 2020-06-15 ENCOUNTER — Ambulatory Visit (INDEPENDENT_AMBULATORY_CARE_PROVIDER_SITE_OTHER): Payer: Medicare Other | Admitting: Internal Medicine

## 2020-06-15 ENCOUNTER — Other Ambulatory Visit: Payer: Self-pay

## 2020-06-15 ENCOUNTER — Encounter: Payer: Self-pay | Admitting: Internal Medicine

## 2020-06-15 VITALS — BP 134/80 | HR 63 | Ht 73.5 in | Wt 212.6 lb

## 2020-06-15 DIAGNOSIS — Z950 Presence of cardiac pacemaker: Secondary | ICD-10-CM | POA: Diagnosis not present

## 2020-06-15 DIAGNOSIS — I10 Essential (primary) hypertension: Secondary | ICD-10-CM

## 2020-06-15 DIAGNOSIS — I484 Atypical atrial flutter: Secondary | ICD-10-CM

## 2020-06-15 DIAGNOSIS — I442 Atrioventricular block, complete: Secondary | ICD-10-CM | POA: Diagnosis not present

## 2020-06-15 NOTE — Patient Instructions (Signed)
Medication Instructions:  Your physician recommends that you continue on your current medications as directed. Please refer to the Current Medication list given to you today.  Labwork: None ordered.  Testing/Procedures: None ordered.  Follow-Up: Your physician wants you to follow-up in: one year with Cristopher Peru, MD or one of the following Advanced Practice Providers on your designated Care Team:    Chanetta Marshall, NP  Tommye Standard, PA-C  Legrand Como "Jonni Sanger" [place], [place]  Remote monitoring is used to monitor your Pacemaker from home. This monitoring reduces the number of office visits required to check your device to one time per year. It allows [place] to keep an eye on the functioning of your device to ensure it is working properly. You are scheduled for a device check from home on 07/20/2020. You may send your transmission at any time that day. If you have a wireless device, the transmission will be sent automatically. After your physician reviews your transmission, you will receive a postcard with your next transmission date.  Any Other Special Instructions Will Be Listed Below (If Applicable).  If you need a refill on your cardiac medications before your next appointment, please call your pharmacy.

## 2020-06-15 NOTE — Progress Notes (Signed)
HPI Trevor Andrews returns today for followup. He is a pleasant 87 yo man with CHB, s/p PPM, HTN, diastolic heart failure, typical atrial flutter and HTN. He has done well in the interim. Despite his advanced age, he tries to stay active. He has class 2 symptoms. He has not had syncope. No angina. He drinks a single ETOH beverage daily.  No Known Allergies   Current Outpatient Medications  Medication Sig Dispense Refill  . acetaminophen (TYLENOL) 500 MG tablet Take 1,000 mg by mouth every 6 (six) hours as needed (pain).    Marland Kitchen allopurinol (ZYLOPRIM) 100 MG tablet Take 100 mg by mouth daily.     Marland Kitchen amLODipine (NORVASC) 5 MG tablet Take 1 tablet (5 mg total) by mouth daily. 90 tablet 3  . atorvastatin (LIPITOR) 40 MG tablet Take 40 mg by mouth at bedtime.     . brimonidine (ALPHAGAN P) 0.1 % SOLN Place 1 drop into both eyes 2 (two) times daily.     . dorzolamide-timolol (COSOPT) 22.3-6.8 MG/ML ophthalmic solution Place 1 drop into both eyes Twice daily.     . furosemide (LASIX) 20 MG tablet Take 2 tablets (40 mg total) by mouth daily. 60 tablet 11  . metoprolol tartrate (LOPRESSOR) 50 MG tablet Take 25 mg by mouth daily.    Marland Kitchen omeprazole (PRILOSEC) 20 MG capsule TAKE ONE CAPSULE BY MOUTH EVERY DAY 90 capsule 3  . prednisoLONE acetate (PRED FORTE) 1 % ophthalmic suspension Place 1 drop into both eyes daily.     . tamsulosin (FLOMAX) 0.4 MG CAPS capsule Take 0.4 mg by mouth at bedtime.    Marland Kitchen apixaban (ELIQUIS) 5 MG TABS tablet Take 1 tablet (5 mg total) by mouth 2 (two) times daily. Start 01/24/19 evening dose 60 tablet 3   No current facility-administered medications for this visit.     Past Medical History:  Diagnosis Date  . Arthritis   . Blindness    due to glaucoma and cataract  . Cancer [facility]) 1998   Prostate. followed by urology  . CHF (congestive heart failure) ([place])   . CKD (chronic kidney disease), stage II   . Coronary artery disease    Had stent placed 2015???  . Dementia  ([place])   . Diabetes mellitus ([place])   . GERD (gastroesophageal reflux disease)   . Hypercholesteremia   . Hypertension   . Shortness of breath dyspnea   . Stroke [facility]) 2016   a. on ASA/Plavix - felt 2/2 small vessel disease source per neuro notes.  . Vertigo     ROS:   All systems reviewed and negative except as noted in the HPI.   Past Surgical History:  Procedure Laterality Date  . CATARACT EXTRACTION  2011 and 2012   2011lt eye and rt eye 2012  . CORONARY ANGIOPLASTY WITH STENT PLACEMENT  2015???  . glauma implant  2012   rt.eye  . PACEMAKER IMPLANT N/A 01/18/2019   Procedure: PACEMAKER IMPLANT;  Surgeon: Evans Lance, MD;  Location: [place] [facility];  Service: Cardiovascular;  Laterality: N/A;  . PROSTATECTOMY  1998     Family History  Problem Relation Age of Onset  . Premature CHD Neg Hx      Social History   Socioeconomic History  . Marital status: Widowed    Spouse name: Not on file  . Number of children: Not on file  . Years of education: Not on file  . Highest education level: Not on  file  Occupational History  . Not on file  Tobacco Use  . Smoking status: Never Smoker  . Smokeless tobacco: Never Used  Substance and Sexual Activity  . Alcohol use: Yes    Alcohol/week: 2.0 standard drinks    Types: 2 Shots of liquor per week    Comment: rare  . Drug use: No  . Sexual activity: Not Currently  Other Topics Concern  . Not on file  Social History Narrative   Retired English as a second language teacher   Lives with daughter   Social Determinants of Radio broadcast assistant Strain: Not on file  Food Insecurity: Not on file  Transportation Needs: Not on file  Physical Activity: Not on file  Stress: Not on file  Social Connections: Not on file  Intimate Partner Violence: Not on file     BP 134/80   Pulse 63   Ht 6' 1.5" (1.867 m)   Wt 212 lb 9.6 oz (96.4 kg)   SpO2 98%   BMI 27.67 kg/m   Physical Exam:  Well appearing NAD HEENT: Unremarkable Neck:  6 cm  JVD, no thyromegally Lymphatics:  No adenopathy Back:  No CVA tenderness Lungs:  Clear with no wheezes HEART:  Regular rate rhythm, no murmurs, no rubs, no clicks Abd:  soft, positive bowel sounds, no organomegally, no rebound, no guarding Ext:  2 plus pulses, no edema, no cyanosis, no clubbing Skin:  No rashes no nodules Neuro:  CN II through XII intact, motor grossly intact  EKG - atrial flutter with a slow VR  DEVICE  Normal device function.  See PaceArt for details.   Assess/Plan: 1. Atrial flutter - he is asymptomatic and his rate is controlled. He will continue eliquis. 2. PPM - his [employer] Jude PPM is mostly working normally. He has some atrial undersensing and we have reprogrammed him VVIR today. 3. Diastolic heart failure -he has class 2 symptoms. I asked him to avoid salty foods. 4. HTN - his bp is well controlled today. No change in meds. He admits to sodium indiscretion and I asked him to work on this.  Trevor Overlie Latonia Conrow,MD

## 2020-07-20 ENCOUNTER — Ambulatory Visit (INDEPENDENT_AMBULATORY_CARE_PROVIDER_SITE_OTHER): Payer: Medicare Other

## 2020-07-20 DIAGNOSIS — I442 Atrioventricular block, complete: Secondary | ICD-10-CM

## 2020-07-20 LAB — CUP PACEART REMOTE DEVICE CHECK
Battery Remaining Longevity: 131 mo
Battery Remaining Percentage: 95.5 %
Battery Voltage: 3.02 V
Brady Statistic RV Percent Paced: 66 %
Date Time Interrogation Session: 20220519020013
Implantable Lead Implant Date: 20201116
Implantable Lead Implant Date: 20201116
Implantable Lead Location: 753859
Implantable Lead Location: 753860
Implantable Pulse Generator Implant Date: 20201116
Lead Channel Impedance Value: 750 Ohm
Lead Channel Pacing Threshold Amplitude: 0.5 V
Lead Channel Pacing Threshold Pulse Width: 0.5 ms
Lead Channel Sensing Intrinsic Amplitude: 5.9 mV
Lead Channel Setting Pacing Amplitude: 2.5 V
Lead Channel Setting Pacing Pulse Width: 0.5 ms
Lead Channel Setting Sensing Sensitivity: 0.7 mV
Pulse Gen Model: 2272
Pulse Gen Serial Number: 9171567

## 2020-08-11 NOTE — Progress Notes (Signed)
Remote pacemaker transmission.   

## 2020-10-19 ENCOUNTER — Ambulatory Visit (INDEPENDENT_AMBULATORY_CARE_PROVIDER_SITE_OTHER): Payer: Medicare Other

## 2020-10-19 DIAGNOSIS — I442 Atrioventricular block, complete: Secondary | ICD-10-CM

## 2020-10-19 LAB — CUP PACEART REMOTE DEVICE CHECK
Battery Remaining Longevity: 109 mo
Battery Remaining Percentage: 86 %
Battery Voltage: 3.02 V
Brady Statistic RV Percent Paced: 67 %
Date Time Interrogation Session: 20220818020013
Implantable Lead Implant Date: 20201116
Implantable Lead Implant Date: 20201116
Implantable Lead Location: 753859
Implantable Lead Location: 753860
Implantable Pulse Generator Implant Date: 20201116
Lead Channel Impedance Value: 710 Ohm
Lead Channel Pacing Threshold Amplitude: 0.5 V
Lead Channel Pacing Threshold Pulse Width: 0.5 ms
Lead Channel Sensing Intrinsic Amplitude: 6.8 mV
Lead Channel Setting Pacing Amplitude: 2.5 V
Lead Channel Setting Pacing Pulse Width: 0.5 ms
Lead Channel Setting Sensing Sensitivity: 0.7 mV
Pulse Gen Model: 2272
Pulse Gen Serial Number: 9171567

## 2020-11-08 NOTE — Progress Notes (Signed)
Remote pacemaker transmission.   

## 2021-01-18 ENCOUNTER — Ambulatory Visit (INDEPENDENT_AMBULATORY_CARE_PROVIDER_SITE_OTHER): Payer: Medicare Other

## 2021-01-18 DIAGNOSIS — I442 Atrioventricular block, complete: Secondary | ICD-10-CM

## 2021-01-18 LAB — CUP PACEART REMOTE DEVICE CHECK
Battery Remaining Longevity: 107 mo
Battery Remaining Percentage: 84 %
Battery Voltage: 3.01 V
Brady Statistic RV Percent Paced: 68 %
Date Time Interrogation Session: 20221117020016
Implantable Lead Implant Date: 20201116
Implantable Lead Implant Date: 20201116
Implantable Lead Location: 753859
Implantable Lead Location: 753860
Implantable Pulse Generator Implant Date: 20201116
Lead Channel Impedance Value: 700 Ohm
Lead Channel Pacing Threshold Amplitude: 0.5 V
Lead Channel Pacing Threshold Pulse Width: 0.5 ms
Lead Channel Sensing Intrinsic Amplitude: 5.6 mV
Lead Channel Setting Pacing Amplitude: 2.5 V
Lead Channel Setting Pacing Pulse Width: 0.5 ms
Lead Channel Setting Sensing Sensitivity: 0.7 mV
Pulse Gen Model: 2272
Pulse Gen Serial Number: 9171567

## 2021-01-29 NOTE — Progress Notes (Signed)
Remote pacemaker transmission.   

## 2021-03-05 ENCOUNTER — Other Ambulatory Visit: Payer: Self-pay

## 2021-03-05 ENCOUNTER — Encounter (HOSPITAL_COMMUNITY): Payer: Self-pay

## 2021-03-05 ENCOUNTER — Observation Stay (HOSPITAL_COMMUNITY)
Admission: EM | Admit: 2021-03-05 | Discharge: 2021-03-07 | Disposition: A | Discharge status: Home / Self Care | Payer: No Typology Code available for payment source | Attending: Emergency Medicine | Admitting: Emergency Medicine

## 2021-03-05 ENCOUNTER — Emergency Department (HOSPITAL_COMMUNITY): Payer: No Typology Code available for payment source

## 2021-03-05 DIAGNOSIS — I509 Heart failure, unspecified: Secondary | ICD-10-CM | POA: Insufficient documentation

## 2021-03-05 DIAGNOSIS — Z7901 Long term (current) use of anticoagulants: Secondary | ICD-10-CM | POA: Diagnosis not present

## 2021-03-05 DIAGNOSIS — I4891 Unspecified atrial fibrillation: Secondary | ICD-10-CM | POA: Diagnosis not present

## 2021-03-05 DIAGNOSIS — Z950 Presence of cardiac pacemaker: Secondary | ICD-10-CM | POA: Diagnosis not present

## 2021-03-05 DIAGNOSIS — Z8546 Personal history of malignant neoplasm of prostate: Secondary | ICD-10-CM | POA: Insufficient documentation

## 2021-03-05 DIAGNOSIS — Z955 Presence of coronary angioplasty implant and graft: Secondary | ICD-10-CM | POA: Diagnosis not present

## 2021-03-05 DIAGNOSIS — E1122 Type 2 diabetes mellitus with diabetic chronic kidney disease: Secondary | ICD-10-CM | POA: Diagnosis not present

## 2021-03-05 DIAGNOSIS — N1831 Chronic kidney disease, stage 3a: Secondary | ICD-10-CM | POA: Diagnosis not present

## 2021-03-05 DIAGNOSIS — U071 COVID-19: Principal | ICD-10-CM | POA: Diagnosis present

## 2021-03-05 DIAGNOSIS — I442 Atrioventricular block, complete: Secondary | ICD-10-CM | POA: Diagnosis present

## 2021-03-05 DIAGNOSIS — I130 Hypertensive heart and chronic kidney disease with heart failure and stage 1 through stage 4 chronic kidney disease, or unspecified chronic kidney disease: Secondary | ICD-10-CM | POA: Diagnosis not present

## 2021-03-05 DIAGNOSIS — I2510 Atherosclerotic heart disease of native coronary artery without angina pectoris: Secondary | ICD-10-CM | POA: Diagnosis not present

## 2021-03-05 DIAGNOSIS — R0602 Shortness of breath: Secondary | ICD-10-CM | POA: Diagnosis present

## 2021-03-05 DIAGNOSIS — F0390 Unspecified dementia without behavioral disturbance: Secondary | ICD-10-CM | POA: Diagnosis not present

## 2021-03-05 DIAGNOSIS — R531 Weakness: Secondary | ICD-10-CM

## 2021-03-05 DIAGNOSIS — J449 Chronic obstructive pulmonary disease, unspecified: Secondary | ICD-10-CM | POA: Diagnosis not present

## 2021-03-05 DIAGNOSIS — I10 Essential (primary) hypertension: Secondary | ICD-10-CM | POA: Diagnosis present

## 2021-03-05 DIAGNOSIS — I4892 Unspecified atrial flutter: Secondary | ICD-10-CM | POA: Diagnosis present

## 2021-03-05 LAB — CBC
HCT: 47.1 % (ref 39.0–52.0)
Hemoglobin: 15.1 g/dL (ref 13.0–17.0)
MCH: 30 pg (ref 26.0–34.0)
MCHC: 32.1 g/dL (ref 30.0–36.0)
MCV: 93.6 fL (ref 80.0–100.0)
Platelets: 168 10*3/uL (ref 150–400)
RBC: 5.03 MIL/uL (ref 4.22–5.81)
RDW: 15.1 % (ref 11.5–15.5)
WBC: 7 10*3/uL (ref 4.0–10.5)
nRBC: 0 % (ref 0.0–0.2)

## 2021-03-05 LAB — TROPONIN I (HIGH SENSITIVITY)
Troponin I (High Sensitivity): 14 ng/L (ref <18)
Troponin I (High Sensitivity): 19 ng/L — ABNORMAL HIGH (ref <18)

## 2021-03-05 LAB — COMPREHENSIVE METABOLIC PANEL
ALT: 16 U/L (ref 0–44)
AST: 16 U/L (ref 15–41)
Albumin: 3.8 g/dL (ref 3.5–5.0)
Alkaline Phosphatase: 64 U/L (ref 38–126)
Anion gap: 4 — ABNORMAL LOW (ref 5–15)
BUN: 18 mg/dL (ref 8–23)
CO2: 26 mmol/L (ref 22–32)
Calcium: 8.7 mg/dL — ABNORMAL LOW (ref 8.9–10.3)
Chloride: 106 mmol/L (ref 98–111)
Creatinine, Ser: 1.29 mg/dL — ABNORMAL HIGH (ref 0.61–1.24)
GFR, Estimated: 53 mL/min — ABNORMAL LOW (ref >60)
Glucose, Bld: 102 mg/dL — ABNORMAL HIGH (ref 70–99)
Potassium: 3.7 mmol/L (ref 3.5–5.1)
Sodium: 136 mmol/L (ref 135–145)
Total Bilirubin: 1.3 mg/dL — ABNORMAL HIGH (ref 0.3–1.2)
Total Protein: 7.7 g/dL (ref 6.5–8.1)

## 2021-03-05 LAB — LIPASE, BLOOD: Lipase: 33 U/L (ref 11–51)

## 2021-03-05 LAB — RESP PANEL BY RT-PCR (FLU A&B, COVID) ARPGX2
Influenza A by PCR: NEGATIVE
Influenza B by PCR: NEGATIVE
SARS Coronavirus 2 by RT PCR: POSITIVE — AB

## 2021-03-05 MED ORDER — MOLNUPIRAVIR EUA 200MG CAPSULE
4.0000 | ORAL_CAPSULE | Freq: Two times a day (BID) | ORAL | Status: DC
Start: 2021-03-06 — End: 2021-03-07
  Administered 2021-03-06 – 2021-03-07 (×3): 800 mg via ORAL
  Filled 2021-03-05: qty 4

## 2021-03-05 NOTE — ED Provider Notes (Signed)
[place] DEPT Provider Note   CSN: [id number] Arrival date & time: 03/05/21  1216     History  Chief Complaint  Patient presents with   Shortness of Breath   Abdominal Pain    Trevor Andrews. is a 88 y.o. male.  HPI He presents for illness present for 2 days characterized by cough and congestion.  He has had decreased appetite.  He also has ongoing cough present for couple months.  He is here with family members who help to give history.  He has had 2 COVID vaccines in 2 boosters.    Home Medications Prior to Admission medications   Medication Sig Start Date End Date Taking? Authorizing Provider  acetaminophen (TYLENOL) 500 MG tablet Take 1,000 mg by mouth every 6 (six) hours as needed (pain).    [provider]  allopurinol (ZYLOPRIM) 100 MG tablet Take 100 mg by mouth daily.     [provider]  amLODipine (NORVASC) 5 MG tablet Take 1 tablet (5 mg total) by mouth daily. 09/08/17   Glenis Smoker, MD  apixaban (ELIQUIS) 5 MG TABS tablet Take 1 tablet (5 mg total) by mouth 2 (two) times daily. Start 01/24/19 evening dose 01/19/19 07/02/19  Chanetta Marshall K, NP  atorvastatin (LIPITOR) 40 MG tablet Take 40 mg by mouth at bedtime.     [provider]  brimonidine (ALPHAGAN P) 0.1 % SOLN Place 1 drop into both eyes 2 (two) times daily.     [provider]  dorzolamide-timolol (COSOPT) 22.3-6.8 MG/ML ophthalmic solution Place 1 drop into both eyes Twice daily.  04/21/11   [provider]  furosemide (LASIX) 20 MG tablet Take 2 tablets (40 mg total) by mouth daily. 06/14/19   Shirley Friar, PA-C  metoprolol tartrate (LOPRESSOR) 50 MG tablet Take 25 mg by mouth daily.    [provider]  omeprazole (PRILOSEC) 20 MG capsule TAKE ONE CAPSULE BY MOUTH EVERY DAY 06/14/19   Wilber Oliphant, MD  prednisoLONE acetate (PRED FORTE) 1 % ophthalmic suspension Place 1 drop into both eyes daily.  04/12/11    [provider]  tamsulosin (FLOMAX) 0.4 MG CAPS capsule Take 0.4 mg by mouth at bedtime.    [provider]      Allergies    Patient has no known allergies.    Review of Systems   Review of Systems  All other systems reviewed and are negative.  Physical Exam Updated Vital Signs BP 116/78 (BP Location: Right Arm)    Pulse 75    Temp 100.3 F (37.9 C) (Oral)    Resp 16    Ht 6' 1.5" (1.867 m)    Wt 97.5 kg    SpO2 96%    BMI 27.98 kg/m  Physical Exam Vitals and nursing note reviewed.  Constitutional:      General: He is not in acute distress.    Appearance: He is well-developed. He is not ill-appearing, toxic-appearing or diaphoretic.  HENT:     Head: Normocephalic and atraumatic.     Right Ear: External ear normal.     Left Ear: External ear normal.  Eyes:     Conjunctiva/sclera: Conjunctivae normal.     Pupils: Pupils are equal, round, and reactive to light.  Neck:     Trachea: Phonation normal.  Cardiovascular:     Rate and Rhythm: Normal rate.  Pulmonary:     Effort: Pulmonary effort is normal.  Abdominal:  General: There is no distension.     Tenderness: There is no abdominal tenderness.  Musculoskeletal:        General: Normal range of motion.     Cervical back: Normal range of motion and neck supple.  Skin:    General: Skin is warm and dry.     Coloration: Skin is not jaundiced.  Neurological:     Mental Status: He is alert and oriented to person, place, and time.     Cranial Nerves: No cranial nerve deficit.     Sensory: No sensory deficit.     Motor: No abnormal muscle tone.     Coordination: Coordination normal.  Psychiatric:        Mood and Affect: Mood normal.        Behavior: Behavior normal.        Thought Content: Thought content normal.        Judgment: Judgment normal.    ED Results / Procedures / Treatments   Labs (all labs ordered are listed, but only abnormal results are displayed) Labs Reviewed  RESP PANEL BY RT-PCR  (FLU A&B, COVID) ARPGX2 - Abnormal; Notable for the following components:      Result Value   SARS Coronavirus 2 by RT PCR POSITIVE (*)    All other components within normal limits  COMPREHENSIVE METABOLIC PANEL - Abnormal; Notable for the following components:   Glucose, Bld 102 (*)    Creatinine, Ser 1.29 (*)    Calcium 8.7 (*)    Total Bilirubin 1.3 (*)    GFR, Estimated 53 (*)    Anion gap 4 (*)    All other components within normal limits  LIPASE, BLOOD  CBC  URINALYSIS, ROUTINE W REFLEX MICROSCOPIC  TROPONIN I (HIGH SENSITIVITY)  TROPONIN I (HIGH SENSITIVITY)    EKG EKG Interpretation  Date/Time:  Monday March 05 2021 19:02:10 EST Ventricular Rate:  78 PR Interval:  86 QRS Duration: 141 QT Interval:  415 QTC Calculation: 470 R Axis:   -54 Text Interpretation: Atrial flutter RBBB and LAFB Premature ventricular complexes ventricular pacing Confirmed by Daleen Bo [phone]) on 03/05/2021 7:24:54 PM  Radiology DG Chest 2 View  Result Date: 03/05/2021 CLINICAL DATA:  Shortness of breath. EXAM: CHEST - 2 VIEW COMPARISON:  May 22, 2019. FINDINGS: The heart size and mediastinal contours are within normal limits. Both lungs are clear. Left-sided pacemaker is unchanged in position. The visualized skeletal structures are unremarkable. IMPRESSION: No active cardiopulmonary disease. Electronically Signed   By: Marijo Conception M.D.   On: 03/05/2021 13:57    Procedures Procedures    Medications Ordered in ED Medications  molnupiravir EUA (LAGEVRIO) capsule 800 mg (has no administration in time range)    ED Course/ Medical Decision Making/ A&P                           Medical Decision Making He presents for evaluation of a respiratory illness present for 2 days with cough and congestion.  Amount and/or Complexity of Data Reviewed Independent Historian:     Details: Family members (wife and daughter) with patient assist with information. Labs: ordered.    Details:  COVID positive, glucose slightly elevated, creatinine high, Radiology: ordered.    Details: CXR normal ECG/medicine tests: ordered.    Details: Atrial Flutter  Risk Prescription drug management.    Patient Vitals for the past 24 hrs:  BP Temp Temp src Pulse Resp SpO2 Height  Weight  03/05/21 2315 -- -- -- -- -- -- 6' 1.5" (1.867 m) 97.5 kg  03/05/21 2202 116/78 -- -- 75 16 96 % -- --  03/05/21 1903 (!) 153/71 -- -- 65 (!) 30 96 % -- --  03/05/21 1729 140/72 -- -- (!) 57 16 96 % -- --  03/05/21 1550 (!) 144/73 -- -- (!) 59 16 97 % -- --  03/05/21 1226 133/67 100.3 F (37.9 C) Oral 60 16 95 % -- --    11:27 PM Reevaluation with update and discussion with patient and family member. After initial assessment and treatment, an updated evaluation reveals patient family members would like to stay in the hospital for further evaluation due to his comorbidities and advanced age. Illness risk, progression of respiratory symptoms, discussed. Daleen Bo    Medical Decision Making: Summary: Patient with acute on chronic congestion, worse over the last 2 days, presenting now with general achiness.  He has had COVID vaccines and boosters  Critical Interventions-clinical evaluation, laboratory testing, radiography, observation and reassessment; to evaluate  Chief Complaint  Patient presents with   Shortness of Breath   Abdominal Pain    and assess for illness characterized as Acute, Previously Undiagnosed, Uncertain Prognosis, and Complicated   After These Interventions, the Patient was reevaluated and was found with COVID infection.  He has significant comorbidities, including atrial flutter on Eliquis, COPD, dementia, and coronary artery disease.  These comorbidities increase the risk of treatment of this patient.  This patient is Presenting for Evaluation of cough and congestion, which does require a range of treatment options, and is a complaint that involves a moderate risk of morbidity and  mortality.  The Differential Diagnoses include pneumonia, viral illness, cardiac disorder.  I obtained additional Historical Information from wife and daughter at bedside, as the patient is somewhat confused about his current history.    Clinical Laboratory Tests Ordered, included CBC, Metabolic panel, and lipase, viral panel, troponin . Review indicates normal except COVID-positive, glucose high, creatinine high, calcium low, total bilirubin high, GFR low, anion gap. Emergent testing abnormality management required for stabilization-initiation of antiviral therapy due to COVID infection  Radiologic Tests Ordered, included chest x-ray.  I independently Visualized: Radiograph images, which show no infiltrate or edema  Cardiac Monitor Tracing which shows atrial flutter with PVCs indicating mild cardiac compromise   Pharmaceutical Risk Management treatment of viral infection, Prescription Management    Treatment Complication Risk elderly patient with multiple comorbidities; specifically hypertension, dementia, coronary artery disease, chronic kidney disease and atrial flutter on anticoagulant medication Hospitalization required to ensure stability   11:23 PM-Consult complete with hospitalist. Patient case explained and discussed. Requirement for observation agreed on. Possible Risk of decompensation.  She agrees to admit patient for further evaluation and treatment. Call ended at 11:38 PM  CRITICAL CARE-no Performed by: Daleen Bo  Nursing Notes Reviewed/ Care Coordinated Applicable Imaging Reviewed Interpretation of Laboratory Data incorporated into ED treatment             Final Clinical Impression(s) / ED Diagnoses Final diagnoses:  COVID-19 virus infection    Rx / DC Orders ED Discharge Orders     None         Daleen Bo, MD 03/05/21 2339

## 2021-03-05 NOTE — ED Notes (Signed)
Attempted IV access x 2 without success.

## 2021-03-05 NOTE — ED Triage Notes (Signed)
Pt BIB EMS from home. Pt reports SHOB that began last night. Pt endorses abdominal pain that began today. Mentation is baseline per family.   BP 130/70

## 2021-03-05 NOTE — ED Provider Notes (Signed)
Emergency Medicine Provider Triage Evaluation Note  Trevor Andrews. , a 88 y.o. male who is legally blind was evaluated in triage.  Pt complains of shortness of breath that worsened yesterday.  When asked when symptoms officially started, he said its been off and on but cannot give further specifics as to whether that has been off and on for weeks or 4 months for 4 days.  He denies chest pain, recent illness.  He is unsure if he has diagnosis of COPD or previous lung disease.  Review of Systems  Positive: Shortness of breath, cough Negative: Chest pain   Physical Exam  BP 133/67 (BP Location: Left Arm)    Pulse 60    Temp 100.3 F (37.9 C) (Oral)    Resp 16    SpO2 95%  Gen:   Awake, no distress   Resp:  Normal effort  MSK:   Moves extremities without difficulty  Other:  Respiratory effort normal without accessory muscle usage.  Oxygenation and 96% on room air.  Medical Decision Making  Medically screening exam initiated at 1:09 PM.  Appropriate orders placed.  [name] was informed that the remainder of the evaluation will be completed by another provider, this initial triage assessment does not replace that evaluation, and the importance of remaining in the ED until their evaluation is complete.     Tonye Pearson, [place] 03/05/21 1313    Tegeler, Gwenyth Allegra, MD 03/05/21 [phone]

## 2021-03-06 DIAGNOSIS — R531 Weakness: Secondary | ICD-10-CM

## 2021-03-06 DIAGNOSIS — U071 COVID-19: Secondary | ICD-10-CM | POA: Diagnosis not present

## 2021-03-06 DIAGNOSIS — I483 Typical atrial flutter: Secondary | ICD-10-CM | POA: Diagnosis not present

## 2021-03-06 DIAGNOSIS — J449 Chronic obstructive pulmonary disease, unspecified: Secondary | ICD-10-CM

## 2021-03-06 DIAGNOSIS — I442 Atrioventricular block, complete: Secondary | ICD-10-CM

## 2021-03-06 DIAGNOSIS — I10 Essential (primary) hypertension: Secondary | ICD-10-CM

## 2021-03-06 LAB — URINALYSIS, ROUTINE W REFLEX MICROSCOPIC
Bilirubin Urine: NEGATIVE
Glucose, UA: NEGATIVE mg/dL
Hgb urine dipstick: NEGATIVE
Ketones, ur: NEGATIVE mg/dL
Leukocytes,Ua: NEGATIVE
Nitrite: NEGATIVE
Protein, ur: NEGATIVE mg/dL
Specific Gravity, Urine: 1.018 (ref 1.005–1.030)
pH: 6 (ref 5.0–8.0)

## 2021-03-06 MED ORDER — FAMOTIDINE 20 MG PO TABS
40.0000 mg | ORAL_TABLET | Freq: Every day | ORAL | Status: DC
  Administered 2021-03-06 – 2021-03-07 (×2): 40 mg via ORAL
  Filled 2021-03-06 (×2): qty 2

## 2021-03-06 MED ORDER — ACETAMINOPHEN 500 MG PO TABS: 1000.0000 mg | ORAL_TABLET | Freq: Four times a day (QID) | ORAL | Status: DC | PRN

## 2021-03-06 MED ORDER — DORZOLAMIDE HCL-TIMOLOL MAL 2-0.5 % OP SOLN
1.0000 [drp] | Freq: Two times a day (BID) | OPHTHALMIC | Status: DC
  Administered 2021-03-06 – 2021-03-07 (×2): 1 [drp] via OPHTHALMIC
  Filled 2021-03-06: qty 10

## 2021-03-06 MED ORDER — TAMSULOSIN HCL 0.4 MG PO CAPS
0.4000 mg | ORAL_CAPSULE | Freq: Every day | ORAL | Status: DC
  Administered 2021-03-06: 0.4 mg via ORAL
  Filled 2021-03-06: qty 1

## 2021-03-06 MED ORDER — MOMETASONE FURO-FORMOTEROL FUM 200-5 MCG/ACT IN AERO
2.0000 | INHALATION_SPRAY | Freq: Two times a day (BID) | RESPIRATORY_TRACT | Status: DC
  Administered 2021-03-06 – 2021-03-07 (×2): 2 via RESPIRATORY_TRACT
  Filled 2021-03-06: qty 8.8

## 2021-03-06 MED ORDER — AMLODIPINE BESYLATE 5 MG PO TABS
5.0000 mg | ORAL_TABLET | Freq: Every day | ORAL | Status: DC
  Administered 2021-03-06 – 2021-03-07 (×2): 5 mg via ORAL
  Filled 2021-03-06 (×2): qty 1

## 2021-03-06 MED ORDER — METOPROLOL TARTRATE 25 MG PO TABS
25.0000 mg | ORAL_TABLET | Freq: Every day | ORAL | Status: DC
  Administered 2021-03-06 – 2021-03-07 (×2): 25 mg via ORAL
  Filled 2021-03-06 (×2): qty 1

## 2021-03-06 MED ORDER — BISMUTH SUBSALICYLATE 262 MG/15ML PO SUSP
30.0000 mL | Freq: Four times a day (QID) | ORAL | Status: DC | PRN
  Filled 2021-03-06: qty 236

## 2021-03-06 MED ORDER — PREDNISOLONE ACETATE 1 % OP SUSP
1.0000 [drp] | Freq: Every day | OPHTHALMIC | Status: DC
  Administered 2021-03-06 – 2021-03-07 (×2): 1 [drp] via OPHTHALMIC
  Filled 2021-03-06: qty 5

## 2021-03-06 MED ORDER — POLYVINYL ALCOHOL 1.4 % OP SOLN
1.0000 [drp] | Freq: Four times a day (QID) | OPHTHALMIC | Status: DC | PRN
  Filled 2021-03-06: qty 15

## 2021-03-06 MED ORDER — FUROSEMIDE 40 MG PO TABS: 20.0000 mg | ORAL_TABLET | Freq: Two times a day (BID) | ORAL | Status: DC

## 2021-03-06 MED ORDER — LACTATED RINGERS IV BOLUS
500.0000 mL | Freq: Once | INTRAVENOUS | Status: AC
  Administered 2021-03-06: 500 mL via INTRAVENOUS

## 2021-03-06 MED ORDER — FUROSEMIDE 20 MG PO TABS
20.0000 mg | ORAL_TABLET | Freq: Two times a day (BID) | ORAL | Status: DC
  Administered 2021-03-07: 20 mg via ORAL
  Filled 2021-03-06: qty 1

## 2021-03-06 MED ORDER — ATORVASTATIN CALCIUM 40 MG PO TABS
40.0000 mg | ORAL_TABLET | Freq: Every day | ORAL | Status: DC
  Administered 2021-03-06: 40 mg via ORAL
  Filled 2021-03-06: qty 1

## 2021-03-06 MED ORDER — APIXABAN 5 MG PO TABS
5.0000 mg | ORAL_TABLET | Freq: Two times a day (BID) | ORAL | Status: DC
  Administered 2021-03-07: 5 mg via ORAL
  Filled 2021-03-06: qty 1

## 2021-03-06 MED ORDER — ALLOPURINOL 100 MG PO TABS
100.0000 mg | ORAL_TABLET | Freq: Every day | ORAL | Status: DC
  Administered 2021-03-06 – 2021-03-07 (×2): 100 mg via ORAL
  Filled 2021-03-06 (×2): qty 1

## 2021-03-06 NOTE — Plan of Care (Signed)
  Problem: Clinical Measurements: Goal: Respiratory complications will improve Outcome: Progressing Goal: Cardiovascular complication will be avoided Outcome: Progressing   Problem: Coping: Goal: Level of anxiety will decrease Outcome: Progressing   

## 2021-03-06 NOTE — H&P (Addendum)
History and Physical    Trevor Andrews. KJZ:[id number] DOB: May 08, 1929 DOA: 03/05/2021  PCP: [facility]  Patient coming from: Home   I have personally briefly reviewed patient's old medical records in [place]  Chief Complaint: Home  HPI: Trevor Andrews. is a 88 y.o. male with medical history significant for CVA, blindness, complete heart block s/p pacemaker, CAD s/p stent, atrial fibrillation on Eliquis, hypertension, COPD, dementia, CKD stage III who presents with concerns of increasing shortness of breath, cough and weakness.  Patient endorsed several days of productive cough, and increasing shortness of breath both at rest and with exertion.  Has not been able to sleep due to the symptoms.  He denies any chest pain or palpitations.  No fever.  Has nausea and had diarrhea but denies any vomiting or abdominal pain.  He also has been feeling more weak and normally grabs onto rails or uses his cane at home to ambulate. Family at bedside believes he has received all recent COVID boosters.   ED Course: He was febrile up to 100.60F, normotensive on room air.  CBC was unremarkable.  Mildly elevated creatinine of 1.29.  Otherwise normal electrolytes. COVID PCR was positive. Chest x-ray is negative.  Review of Systems: Constitutional: No Fever ENT/Mouth: No sore throat Eyes: legal blindness Cardiovascular: No Chest Pain, + SOB, no Edema Respiratory: +Cough, + Sputum, No Wheezing, + Dyspnea  Gastrointestinal: + Nausea, No Vomiting, No Diarrhea,No Pain Genitourinary: no Urinary Incontinence, Musculoskeletal: No Arthralgias, No Myalgias Skin: No Skin Lesions,  Neuro: +Weakness, No Numbness Psych: no decrease appetite Heme/Lymph: No Bruising, No Bleeding  Social history Patient lives at home with his daughter and she helps him with cooking and with his medications.  Otherwise he is able to do with other ADLs.  Denies tobacco, alcohol illicit drug use.  Past Medical  History:  Diagnosis Date   Arthritis    Blindness    due to glaucoma and cataract   Cancer ([place]) 1998   Prostate. followed by urology   CHF (congestive heart failure) ([place])    CKD (chronic kidney disease), stage II    Coronary artery disease    Had stent placed 2015???   Dementia ([place])    Diabetes mellitus ([place])    GERD (gastroesophageal reflux disease)    Hypercholesteremia    Hypertension    Shortness of breath dyspnea    Stroke ([place]) 2016   a. on ASA/Plavix - felt 2/2 small vessel disease source per neuro notes.   Vertigo     Past Surgical History:  Procedure Laterality Date   CATARACT EXTRACTION  2011 and 2012   2011lt eye and rt eye 2012   CORONARY ANGIOPLASTY WITH STENT PLACEMENT  2015???   glauma implant  2012   rt.eye   PACEMAKER IMPLANT N/A 01/18/2019   Procedure: PACEMAKER IMPLANT;  Surgeon: Evans Lance, MD;  Location: [facility];  Service: Cardiovascular;  Laterality: N/A;   PROSTATECTOMY  1998     reports that he has never smoked. He has never used smokeless tobacco. He reports current alcohol use of about 2.0 standard drinks per week. He reports that he does not use drugs. Social History  No Known Allergies  Family History  Problem Relation Age of Onset   Premature CHD Neg Hx      Prior to Admission medications   Medication Sig Start Date End Date Taking? Authorizing Provider  acetaminophen (TYLENOL) 500 MG tablet Take 1,000  mg by mouth every 6 (six) hours as needed (pain).    [provider]  allopurinol (ZYLOPRIM) 100 MG tablet Take 100 mg by mouth daily.     [provider]  amLODipine (NORVASC) 5 MG tablet Take 1 tablet (5 mg total) by mouth daily. 09/08/17   Glenis Smoker, MD  apixaban (ELIQUIS) 5 MG TABS tablet Take 1 tablet (5 mg total) by mouth 2 (two) times daily. Start 01/24/19 evening dose 01/19/19 07/02/19  Chanetta Marshall K, NP  atorvastatin (LIPITOR) 40 MG tablet Take 40 mg by mouth at bedtime.      [provider]  brimonidine (ALPHAGAN P) 0.1 % SOLN Place 1 drop into both eyes 2 (two) times daily.     [provider]  dorzolamide-timolol (COSOPT) 22.3-6.8 MG/ML ophthalmic solution Place 1 drop into both eyes Twice daily.  04/21/11   [provider]  furosemide (LASIX) 20 MG tablet Take 2 tablets (40 mg total) by mouth daily. 06/14/19   Shirley Friar, PA-C  metoprolol tartrate (LOPRESSOR) 50 MG tablet Take 25 mg by mouth daily.    [provider]  omeprazole (PRILOSEC) 20 MG capsule TAKE ONE CAPSULE BY MOUTH EVERY DAY 06/14/19   Wilber Oliphant, MD  prednisoLONE acetate (PRED FORTE) 1 % ophthalmic suspension Place 1 drop into both eyes daily.  04/12/11   [provider]  tamsulosin (FLOMAX) 0.4 MG CAPS capsule Take 0.4 mg by mouth at bedtime.    [provider]    Physical Exam: Vitals:   03/05/21 1729 03/05/21 1903 03/05/21 2202 03/05/21 2315  BP: 140/72 (!) 153/71 116/78   Pulse: (!) 57 65 75   Resp: 16 (!) 30 16   Temp:      TempSrc:      SpO2: 96% 96% 96%   Weight:    97.5 kg  Height:    6' 1.5" (1.867 m)    Constitutional: NAD, calm, comfortable, non-toxic appearing elderly male laying in bed Vitals:   03/05/21 1729 03/05/21 1903 03/05/21 2202 03/05/21 2315  BP: 140/72 (!) 153/71 116/78   Pulse: (!) 57 65 75   Resp: 16 (!) 30 16   Temp:      TempSrc:      SpO2: 96% 96% 96%   Weight:    97.5 kg  Height:    6' 1.5" (1.867 m)   Eyes: has left eye closed and slight opacity to right lens ENMT: Mucous membranes are moist.  Neck: normal, supple Respiratory: clear to auscultation bilaterally, no wheezing, no crackles. Normal respiratory effort on room air. No accessory muscle use.  Cardiovascular: Regular rate and rhythm, no murmurs / rubs / gallops. No extremity edema.  Abdomen: no tenderness, no masses palpated. Bowel sounds positive.  Musculoskeletal: no clubbing / cyanosis. No joint deformity upper and lower  extremities.  Normal muscle tone.  Skin: no rashes, lesions, ulcers. No induration Neurologic: CN 2-12 grossly intact. Strength 5/5 in all 4.  Psychiatric: Normal judgment and insight. Alert and oriented x 3. Normal mood.     Labs on Admission: I have personally reviewed following labs and imaging studies  CBC: Recent Labs  Lab 03/05/21 1256  WBC 7.0  HGB 15.1  HCT 47.1  MCV 93.6  PLT 650   Basic Metabolic Panel: Recent Labs  Lab 03/05/21 1256  NA 136  K 3.7  CL 106  CO2 26  GLUCOSE 102*  BUN 18  CREATININE 1.29*  CALCIUM 8.7*  GFR: Estimated Creatinine Clearance: 48.2 mL/min (A) (by C-G formula based on SCr of 1.29 mg/dL (H)). Liver Function Tests: Recent Labs  Lab 03/05/21 1256  AST 16  ALT 16  ALKPHOS 64  BILITOT 1.3*  PROT 7.7  ALBUMIN 3.8   Recent Labs  Lab 03/05/21 1256  LIPASE 33   No results for input(s): AMMONIA in the last 168 hours. Coagulation Profile: No results for input(s): INR, PROTIME in the last 168 hours. Cardiac Enzymes: No results for input(s): CKTOTAL, CKMB, CKMBINDEX, TROPONINI in the last 168 hours. BNP (last 3 results) No results for input(s): PROBNP in the last 8760 hours. HbA1C: No results for input(s): HGBA1C in the last 72 hours. CBG: No results for input(s): GLUCAP in the last 168 hours. Lipid Profile: No results for input(s): CHOL, HDL, LDLCALC, TRIG, CHOLHDL, LDLDIRECT in the last 72 hours. Thyroid Function Tests: No results for input(s): TSH, T4TOTAL, FREET4, T3FREE, THYROIDAB in the last 72 hours. Anemia Panel: No results for input(s): VITAMINB12, FOLATE, FERRITIN, TIBC, IRON, RETICCTPCT in the last 72 hours. Urine analysis:    Component Value Date/Time   COLORURINE YELLOW 09/05/2018 1146   APPEARANCEUR CLEAR 09/05/2018 1146   LABSPEC 1.010 09/05/2018 1146   PHURINE 6.0 09/05/2018 1146   GLUCOSEU NEGATIVE 09/05/2018 1146   HGBUR SMALL (A) 09/05/2018 1146   BILIRUBINUR NEGATIVE 09/05/2018 1146   KETONESUR  NEGATIVE 09/05/2018 1146   PROTEINUR NEGATIVE 09/05/2018 1146   UROBILINOGEN 0.2 11/18/2014 0147   NITRITE NEGATIVE 09/05/2018 1146   LEUKOCYTESUR MODERATE (A) 09/05/2018 1146    Radiological Exams on Admission: DG Chest 2 View  Result Date: 03/05/2021 CLINICAL DATA:  Shortness of breath. EXAM: CHEST - 2 VIEW COMPARISON:  May 22, 2019. FINDINGS: The heart size and mediastinal contours are within normal limits. Both lungs are clear. Left-sided pacemaker is unchanged in position. The visualized skeletal structures are unremarkable. IMPRESSION: No active cardiopulmonary disease. Electronically Signed   By: Marijo Conception M.D.   On: 03/05/2021 13:57      Assessment/Plan  COVID-19 viral illness Will continue Molnupiravir in ED given contraindication to Paxlovid with Eliquis use.  Monitor CMP  Weakness secondary to COVID infection and likely deconditioning PT eval   Hx of complete heart block s/p pacemaker CAD s/p stent  asymptomatic   Hx of Atrial flutter Rate controlled Continue Eliquis  Hypertension Controlled  COPD Not in acute exacerbation   CKD stage III Stable with creatinine of 1.29  Needs med rec verify by family in the morning  DVT prophylaxis:.Eliquis Code Status: Full Family Communication: Plan discussed with patient and family at bedside  disposition Plan: Home with observation Consults called:  Admission status: Observation  Level of care: Med-Surg  Status is: Observation  The patient remains OBS appropriate and will d/c before 2 midnights.        Orene Desanctis DO [facility]   If 7PM-7AM, please contact night-coverage [email or web address]   03/06/2021, 12:20 AM

## 2021-03-06 NOTE — Evaluation (Signed)
Physical Therapy Evaluation Patient Details Name: Trevor No. MRN: [id number] DOB: 11/29/1929 Today's Date: 03/06/2021  History of Present Illness  Trevor Alsip. is a 88 y.o. male with medical history significant for CVA, blindness, complete heart block s/p pacemaker, CAD s/p stent, atrial fibrillation on Eliquis, hypertension, COPD, dementia, CKD stage III who presents with concerns of increasing shortness of breath, cough and weakness, tested positive for covid.  Clinical Impression  The patient is  very pleasant, patient legally blind. Ambulated in room using RW x 40' with min guard.  Patient resides with daughter,  mod I in home.  SPO2 96% on RA , HR range 96 to 117 . Pt admitted with above diagnosis.   Pt currently with functional limitations due to the deficits listed below (see PT Problem List). Pt will benefit from skilled PT to increase their independence and safety with mobility to allow discharge to the venue listed below.    Patient should progress to return home with  assistance as prior to admission.       Recommendations for follow up therapy are one component of a multi-disciplinary discharge planning process, led by the attending physician.  Recommendations may be updated based on patient status, additional functional criteria and insurance authorization.  Follow Up Recommendations No PT follow up    Assistance Recommended at Discharge Intermittent Supervision/Assistance  Patient can return home with the following  A little help with walking and/or transfers;Assistance with cooking/housework;Help with stairs or ramp for entrance;Assist for transportation;Direct supervision/assist for medications management    Equipment Recommendations None recommended by PT  Recommendations for Other Services       Functional Status Assessment Patient has had a recent decline in their functional status and demonstrates the ability to make significant improvements in function in a  reasonable and predictable amount of time.     Precautions / Restrictions Precautions Precautions: Fall Precaution Comments: monitor SATS and HR, legally blind, "sees shadows"      Mobility  Bed Mobility Overal bed mobility: Modified Independent                  Transfers Overall transfer level: Needs assistance Equipment used: Rolling walker (2 wheels) Transfers: Sit to/from Stand Sit to Stand: Min guard           General transfer comment: for safety in unknown environment    Ambulation/Gait Ambulation/Gait assistance: Min guard Gait Distance (Feet): 40 Feet Assistive device: Rolling walker (2 wheels) Gait Pattern/deviations: Step-through pattern Gait velocity: decr     General Gait Details: patient able to manage turns with verbal cues, steady gait in small confined space.  Stairs            Wheelchair Mobility    Modified Rankin (Stroke Patients Only)       Balance Overall balance assessment: Needs assistance Sitting-balance support: Feet supported;No upper extremity supported Sitting balance-Leahy Scale: Good     Standing balance support: During functional activity;Bilateral upper extremity supported;Reliant on assistive device for balance Standing balance-Leahy Scale: Good                               Pertinent Vitals/Pain Pain Assessment: No/denies pain    Home Living Family/patient expects to be discharged to:: Private residence Living Arrangements: Children Available Help at Discharge: Family;Available 24 hours/day Type of Home: House Home Access: Ramped entrance         Home Equipment: Rollator (  4 wheels);Cane - single point      Prior Function Prior Level of Function : Independent/Modified Independent             Mobility Comments: reports gets around home withot ot without AD, uses Rollator in community. ADLs Comments: IADLs for self     Hand Dominance   Dominant Hand: Right    Extremity/Trunk  Assessment   Upper Extremity Assessment Upper Extremity Assessment: Overall WFL for tasks assessed    Lower Extremity Assessment Lower Extremity Assessment: Overall WFL for tasks assessed    Cervical / Trunk Assessment Cervical / Trunk Assessment: Normal  Communication   Communication: No difficulties  Cognition Arousal/Alertness: Awake/alert Behavior During Therapy: WFL for tasks assessed/performed Overall Cognitive Status: Within Functional Limits for tasks assessed                                          General Comments      Exercises     Assessment/Plan    PT Assessment Patient needs continued PT services  PT Problem List Decreased strength;Decreased mobility;Decreased knowledge of precautions;Decreased activity tolerance;Cardiopulmonary status limiting activity;Decreased balance;Decreased knowledge of use of DME       PT Treatment Interventions DME instruction;Therapeutic activities;Gait training;Therapeutic exercise;Patient/family education;Functional mobility training    PT Goals (Current goals can be found in the Care Plan section)  Acute Rehab PT Goals Patient Stated Goal: go home PT Goal Formulation: With patient Time For Goal Achievement: 03/20/21 Potential to Achieve Goals: Good    Frequency Min 3X/week     Co-evaluation               AM-PAC PT "6 Clicks" Mobility  Outcome Measure Help needed turning from your back to your side while in a flat bed without using bedrails?: None Help needed moving from lying on your back to sitting on the side of a flat bed without using bedrails?: None Help needed moving to and from a bed to a chair (including a wheelchair)?: A Little Help needed standing up from a chair using your arms (e.g., wheelchair or bedside chair)?: A Little Help needed to walk in hospital room?: A Little Help needed climbing 3-5 steps with a railing? : A Little 6 Click Score: 20    End of Session Equipment Utilized  During Treatment: Gait belt Activity Tolerance: Patient tolerated treatment well Patient left: in bed;with call bell/phone within reach Nurse Communication: Mobility status PT Visit Diagnosis: Unsteadiness on feet (R26.81);Difficulty in walking, not elsewhere classified (R26.2)    Time: 9470-9628 PT Time Calculation (min) (ACUTE ONLY): 35 min   Charges:   PT Evaluation $PT Eval Low Complexity: 1 Low PT Treatments $Gait Training: 8-22 mins        Tresa Endo PT Acute Rehabilitation Services Pager [phone] Office [phone]   Claretha Cooper 03/06/2021, 9:53 AM

## 2021-03-06 NOTE — Progress Notes (Signed)
Same day note  Patient seen and examined at bedside.  Patient was admitted to the hospital for generalized weakness and cough.  At the time of my evaluation, patient complains of generalized weakness.  Physical examination reveals elderly male, not in obvious distress.  Decreased breath sounds bilaterally.  No obvious wheezes.  Legally blind.  Moving all the extremities.  Laboratory data and imaging was reviewed  Assessment and Plan.  COVID-19 illness. Molnupiravir in ED given contraindication to Paxlovid with Eliquis use.  Continue for now.  Generalized deconditioning and weakness secondary to COVID-19 infection.  We will get PT evaluation  History of atrial flutter.  Rate controlled.  Continue Eliquis.  History of complete heart block status post pacemaker placement and stent placement.  Asymptomatic at this time.  Essential hypertension.  Controlled.  Continue amlodipine from home.  COPD not in acute exacerbation.  Continue inhalers.  Hyperlipidemia.  Continue Lipitor from home.  CKD stage IIIa.  Creatinine of 1.12 this morning.  We will continue to monitor closely.  History of BPH.  Resume tamsulosin.  No Charge  Signed,  Delila Pereyra, MD [facility]

## 2021-03-07 DIAGNOSIS — U071 COVID-19: Secondary | ICD-10-CM | POA: Diagnosis not present

## 2021-03-07 DIAGNOSIS — J449 Chronic obstructive pulmonary disease, unspecified: Secondary | ICD-10-CM | POA: Diagnosis not present

## 2021-03-07 DIAGNOSIS — I483 Typical atrial flutter: Secondary | ICD-10-CM | POA: Diagnosis not present

## 2021-03-07 DIAGNOSIS — I442 Atrioventricular block, complete: Secondary | ICD-10-CM | POA: Diagnosis not present

## 2021-03-07 LAB — CBC WITH DIFFERENTIAL/PLATELET
Abs Immature Granulocytes: 0.01 10*3/uL (ref 0.00–0.07)
Basophils Absolute: 0 10*3/uL (ref 0.0–0.1)
Basophils Relative: 0 %
Eosinophils Absolute: 0.1 10*3/uL (ref 0.0–0.5)
Eosinophils Relative: 1 %
HCT: 48.2 % (ref 39.0–52.0)
Hemoglobin: 15.2 g/dL (ref 13.0–17.0)
Immature Granulocytes: 0 %
Lymphocytes Relative: 26 %
Lymphs Abs: 1.2 10*3/uL (ref 0.7–4.0)
MCH: 30.2 pg (ref 26.0–34.0)
MCHC: 31.5 g/dL (ref 30.0–36.0)
MCV: 95.6 fL (ref 80.0–100.0)
Monocytes Absolute: 0.7 10*3/uL (ref 0.1–1.0)
Monocytes Relative: 16 %
Neutro Abs: 2.6 10*3/uL (ref 1.7–7.7)
Neutrophils Relative %: 57 %
Platelets: 136 10*3/uL — ABNORMAL LOW (ref 150–400)
RBC: 5.04 MIL/uL (ref 4.22–5.81)
RDW: 15.3 % (ref 11.5–15.5)
WBC: 4.6 10*3/uL (ref 4.0–10.5)
nRBC: 0 % (ref 0.0–0.2)

## 2021-03-07 LAB — COMPREHENSIVE METABOLIC PANEL
ALT: 14 U/L (ref 0–44)
AST: 21 U/L (ref 15–41)
Albumin: 3.2 g/dL — ABNORMAL LOW (ref 3.5–5.0)
Alkaline Phosphatase: 49 U/L (ref 38–126)
Anion gap: 12 (ref 5–15)
BUN: 16 mg/dL (ref 8–23)
CO2: 21 mmol/L — ABNORMAL LOW (ref 22–32)
Calcium: 8.7 mg/dL — ABNORMAL LOW (ref 8.9–10.3)
Chloride: 103 mmol/L (ref 98–111)
Creatinine, Ser: 0.84 mg/dL (ref 0.61–1.24)
GFR, Estimated: >60 mL/min (ref >60)
Glucose, Bld: 82 mg/dL (ref 70–99)
Potassium: 4.2 mmol/L (ref 3.5–5.1)
Sodium: 136 mmol/L (ref 135–145)
Total Bilirubin: 1.3 mg/dL — ABNORMAL HIGH (ref 0.3–1.2)
Total Protein: 7.2 g/dL (ref 6.5–8.1)

## 2021-03-07 LAB — PHOSPHORUS: Phosphorus: 2.9 mg/dL (ref 2.5–4.6)

## 2021-03-07 LAB — MAGNESIUM: Magnesium: 2.1 mg/dL (ref 1.7–2.4)

## 2021-03-07 MED ORDER — ALBUTEROL SULFATE HFA 108 (90 BASE) MCG/ACT IN AERS: 2.0000 | INHALATION_SPRAY | Freq: Four times a day (QID) | RESPIRATORY_TRACT | 2 refills | Status: AC | PRN

## 2021-03-07 MED ORDER — MOLNUPIRAVIR EUA 200MG CAPSULE: 4.0000 | ORAL_CAPSULE | Freq: Two times a day (BID) | ORAL | 0 refills | Status: AC

## 2021-03-07 NOTE — Discharge Summary (Signed)
Physician Discharge Summary  Trevor Andrews. ONG:[id number] DOB: March 25, 1929 DOA: 03/05/2021  PCP: [facility]  Admit date: 03/05/2021 Discharge date: 03/07/2021  Admitted From: Home  Discharge disposition: Home  Recommendations for Outpatient Follow-Up:   Follow up with your primary care provider in one week.  Check CBC, BMP, magnesium in the next visit Complete the course of antiviral that has been prescribed   Discharge Diagnosis:   Principal Problem:   COVID-19 virus infection Active Problems:   Hypertension   Atrial flutter ([place])   Heart block AV complete ([place])   Chronic obstructive pulmonary disease ([place])   Weakness   Discharge Condition: Improved.  Diet recommendation: Low sodium, heart healthy.   Wound care: None.  Code status: partial code  History of Present Illness:   Trevor Andrews. is a 88 y.o. male with past medical history of CVA, legal blindness, complete heart block status post pacemaker placement, status post stent, atrial fibrillation on Eliquis, COPD, dementia, stage III CKD presented to hospital with increasing shortness of breath cough and weakness for several days with difficulty sleeping.  In the ED, patient was noted to be mildly febrile.  CBC was unremarkable.  Creatinine was mildly elevated at 1.2.  COVID PCR was positive.  Chest x-ray was negative.  Patient was then admitted to hospital for further observation.  Hospital Course:   Following conditions were addressed during hospitalization as listed below,  COVID-19 illness. Molnupiravir was given since there is contraindication to Paxlovid with Eliquis use.  This will be continued on discharge to complete 5-day course.   Generalized deconditioning and weakness secondary to COVID-19 infection.  Physical herapy saw the patient and recommended no skilled therapy needs.  Urinalysis was negative.  Influenza was negative.  Electrolytes were within normal limits.   History of atrial  flutter.  Rate controlled.  Continue Eliquis.  History of complete heart block status post pacemaker placement and stent placement.  Asymptomatic at this time.  Essential hypertension.  Controlled.  Continue amlodipine from home.  COPD not in acute exacerbation.  Continue inhalers on discharge..   Hyperlipidemia.  Continue Lipitor from home.  CKD stage IIIa.  Creatinine improved to 0.8 prior to discharge  History of BPH.  Continue tamsulosin on discharge.  Disposition.  At this time, patient is stable for disposition home with outpatient PCP follow-up.  Medical Consultants:   None.  Procedures:    None Subjective:   Today, patient was seen and examined at bedside.  Patient feels okay at this time.  Was able to ambulate without any issues.  No dyspnea or shortness of breath.  Discharge Exam:   Vitals:   03/06/21 2347 03/07/21 0336  BP: 136/62 120/77  Pulse: (!) 58 75  Resp: (!) 24 (!) 24  Temp: 98.4 F (36.9 C) 98.9 F (37.2 C)  SpO2: 96% 95%   Vitals:   03/06/21 1912 03/06/21 1947 03/06/21 2347 03/07/21 0336  BP: (!) 158/109 115/67 136/62 120/77  Pulse: (!) 57 (!) 57 (!) 58 75  Resp: (!) 30 (!) 22 (!) 24 (!) 24  Temp:  99 F (37.2 C) 98.4 F (36.9 C) 98.9 F (37.2 C)  TempSrc:  Oral Oral Oral  SpO2: 95% 98% 96% 95%  Weight:      Height:       General: Alert awake, not in obvious distress HENT: pupils equally reacting to light,  No scleral pallor or icterus noted. Oral mucosa is moist.  Legal blindness noted,  right lens opacity Chest:  Clear breath sounds.  Diminished breath sounds bilaterally. No crackles or wheezes.  CVS: S1 &S2 heard. No murmur.  Regular rate and rhythm. Abdomen: Soft, nontender, nondistended.  Bowel sounds are heard.   Extremities: No cyanosis, clubbing or edema.  Peripheral pulses are palpable. Psych: Alert, awake and oriented, normal mood CNS:  No cranial nerve deficits.  Power equal in all extremities.   Skin: Warm and dry.  No  rashes noted.  The results of significant diagnostics from this hospitalization (including imaging, microbiology, ancillary and laboratory) are listed below for reference.     Diagnostic Studies:   DG Chest 2 View  Result Date: 03/05/2021 CLINICAL DATA:  Shortness of breath. EXAM: CHEST - 2 VIEW COMPARISON:  May 22, 2019. FINDINGS: The heart size and mediastinal contours are within normal limits. Both lungs are clear. Left-sided pacemaker is unchanged in position. The visualized skeletal structures are unremarkable. IMPRESSION: No active cardiopulmonary disease. Electronically Signed   By: Marijo Conception M.D.   On: 03/05/2021 13:57     Labs:   Basic Metabolic Panel: Recent Labs  Lab 03/05/21 1256 03/07/21 0509  NA 136 136  K 3.7 4.2  CL 106 103  CO2 26 21*  GLUCOSE 102* 82  BUN 18 16  CREATININE 1.29* 0.84  CALCIUM 8.7* 8.7*  MG  --  2.1  PHOS  --  2.9    GFR Estimated Creatinine Clearance: 74 mL/min (by C-G formula based on SCr of 0.84 mg/dL). Liver Function Tests: Recent Labs  Lab 03/05/21 1256 03/07/21 0509  AST 16 21  ALT 16 14  ALKPHOS 64 49  BILITOT 1.3* 1.3*  PROT 7.7 7.2  ALBUMIN 3.8 3.2*    Recent Labs  Lab 03/05/21 1256  LIPASE 33    No results for input(s): AMMONIA in the last 168 hours. Coagulation profile No results for input(s): INR, PROTIME in the last 168 hours.  CBC: Recent Labs  Lab 03/05/21 1256 03/07/21 0509  WBC 7.0 4.6  NEUTROABS  --  2.6  HGB 15.1 15.2  HCT 47.1 48.2  MCV 93.6 95.6  PLT 168 136*    Cardiac Enzymes: No results for input(s): CKTOTAL, CKMB, CKMBINDEX, TROPONINI in the last 168 hours. BNP: Invalid input(s): POCBNP CBG: No results for input(s): GLUCAP in the last 168 hours. D-Dimer No results for input(s): DDIMER in the last 72 hours. Hgb A1c No results for input(s): HGBA1C in the last 72 hours. Lipid Profile No results for input(s): CHOL, HDL, LDLCALC, TRIG, CHOLHDL, LDLDIRECT in the last 72  hours. Thyroid function studies No results for input(s): TSH, T4TOTAL, T3FREE, THYROIDAB in the last 72 hours.  Invalid input(s): FREET3 Anemia work up No results for input(s): VITAMINB12, FOLATE, FERRITIN, TIBC, IRON, RETICCTPCT in the last 72 hours. Microbiology Recent Results (from the past 240 hour(s))  Resp Panel by RT-PCR (Flu A&B, Covid) Nasopharyngeal Swab     Status: Abnormal   Collection Time: 03/05/21  1:27 PM   Specimen: Nasopharyngeal Swab; Nasopharyngeal(NP) swabs in vial transport medium  Result Value Ref Range Status   SARS Coronavirus 2 by RT PCR POSITIVE (A) NEGATIVE Final    Comment: (NOTE) SARS-CoV-2 target nucleic acids are DETECTED.  The SARS-CoV-2 RNA is generally detectable in upper respiratory specimens during the acute phase of infection. Positive results are indicative of the presence of the identified virus, but do not rule out bacterial infection or co-infection with other pathogens not detected by the test. Clinical correlation  with patient history and other diagnostic information is necessary to determine patient infection status. The expected result is Negative.  Fact Sheet for Patients: EntrepreneurPulse.com.au  Fact Sheet for Healthcare Providers: IncredibleEmployment.be  This test is not yet approved or cleared by the [place] FDA and  has been authorized for detection and/or diagnosis of SARS-CoV-2 by FDA under an Emergency Use Authorization (EUA).  This EUA will remain in effect (meaning this test can be used) for the duration of  the COVID-19 declaration under Section 564(b)(1) of the A ct, 21 U.S.C. section 360bbb-3(b)(1), unless the authorization is terminated or revoked sooner.     Influenza A by PCR NEGATIVE NEGATIVE Final   Influenza B by PCR NEGATIVE NEGATIVE Final    Comment: (NOTE) The Xpert Xpress SARS-CoV-2/FLU/RSV plus assay is intended as an aid in the diagnosis of influenza from  Nasopharyngeal swab specimens and should not be used as a sole basis for treatment. Nasal washings and aspirates are unacceptable for Xpert Xpress SARS-CoV-2/FLU/RSV testing.  Fact Sheet for Patients: EntrepreneurPulse.com.au  Fact Sheet for Healthcare Providers: IncredibleEmployment.be  This test is not yet approved or cleared by the [place] FDA and has been authorized for detection and/or diagnosis of SARS-CoV-2 by FDA under an Emergency Use Authorization (EUA). This EUA will remain in effect (meaning this test can be used) for the duration of the COVID-19 declaration under Section 564(b)(1) of the Act, 21 U.S.C. section 360bbb-3(b)(1), unless the authorization is terminated or revoked.  Performed at [facility], [place] [address], [place], [place] [address]      Discharge Instructions:   Discharge Instructions     Call MD for:  difficulty breathing, headache or visual disturbances   Complete by: As directed    Call MD for:  severe uncontrolled pain   Complete by: As directed    Call MD for:  temperature >100.4   Complete by: As directed    Diet - low sodium heart healthy   Complete by: As directed    Discharge instructions   Complete by: As directed    Follow up with your primary care physician in one week. No overexertion.   Increase activity slowly   Complete by: As directed       Allergies as of 03/07/2021       Reactions   Citalopram    Headache   Sertraline Diarrhea   Headache, Indigestion        Medication List     TAKE these medications    acetaminophen 500 MG tablet Commonly known as: TYLENOL Take 1,000 mg by mouth every 6 (six) hours as needed (pain).   albuterol 108 (90 Base) MCG/ACT inhaler Commonly known as: VENTOLIN HFA Inhale 2 puffs into the lungs every 6 (six) hours as needed for wheezing or shortness of breath.   allopurinol 100 MG tablet Commonly known as: ZYLOPRIM Take  100 mg by mouth daily.   amLODipine 5 MG tablet Commonly known as: NORVASC Take 1 tablet (5 mg total) by mouth daily.   apixaban 5 MG Tabs tablet Commonly known as: ELIQUIS Take 1 tablet (5 mg total) by mouth 2 (two) times daily. Start 01/24/19 evening dose   atorvastatin 40 MG tablet Commonly known as: LIPITOR Take 40 mg by mouth at bedtime.   bismuth subsalicylate 242 PN/36RW suspension Commonly known as: PEPTO BISMOL Take 30 mLs by mouth every 6 (six) hours as needed for indigestion or diarrhea or loose stools.   brimonidine 0.2 % ophthalmic  solution Commonly known as: ALPHAGAN Place 1 drop into both eyes in the morning, at noon, and at bedtime.   dorzolamide-timolol 22.3-6.8 MG/ML ophthalmic solution Commonly known as: COSOPT Place 1 drop into both eyes Twice daily.   famotidine 40 MG tablet Commonly known as: PEPCID Take 40 mg by mouth daily.   fluticasone-salmeterol 500-50 MCG/ACT Aepb Commonly known as: ADVAIR Inhale 1 puff into the lungs in the morning and at bedtime.   furosemide 20 MG tablet Commonly known as: LASIX Take 2 tablets (40 mg total) by mouth daily. What changed:  how much to take when to take this   metoprolol tartrate 50 MG tablet Commonly known as: LOPRESSOR Take 25 mg by mouth daily.   molnupiravir EUA 200 mg Caps capsule Commonly known as: LAGEVRIO Take 4 capsules (800 mg total) by mouth 2 (two) times daily for 4 days.   polyvinyl alcohol 1.4 % ophthalmic solution Commonly known as: LIQUIFILM TEARS Place 1 drop into both eyes 4 (four) times daily as needed for dry eyes.   prednisoLONE acetate 1 % ophthalmic suspension Commonly known as: PRED FORTE Place 1 drop into both eyes daily.   tamsulosin 0.4 MG Caps capsule Commonly known as: FLOMAX Take 0.4 mg by mouth at bedtime.        Follow-up Information     [facility] Follow [facility].   Contact information: [place]  [address] [phone]         Evans Lance, MD .   Specialty: Cardiology Contact information: [phone] [place] [address] [phone]                  Time coordinating discharge: 39 minutes  Signed:  Tekoa Hamor  [facility] 03/07/2021, 3:46 PM

## 2021-03-07 NOTE — Progress Notes (Deleted)
Physician Discharge Summary  Trevor Andrews. GUR:[id number] DOB: 26-Aug-1929 DOA: 03/05/2021  PCP: [facility]  Admit date: 03/05/2021 Discharge date: 03/07/2021  Admitted From: Home  Discharge disposition: Home  Recommendations for Outpatient Follow-Up:   Follow up with your primary care provider in one week.  Check CBC, BMP, magnesium in the next visit Complete the course of antiviral that has been prescribed   Discharge Diagnosis:   Principal Problem:   COVID-19 virus infection Active Problems:   Hypertension   Atrial flutter ([place])   Heart block AV complete ([place])   Chronic obstructive pulmonary disease ([place])   Weakness   Discharge Condition: Improved.  Diet recommendation: Low sodium, heart healthy.   Wound care: None.  Code status: partial code  History of Present Illness:   Trevor Andrews. is a 88 y.o. male with past medical history of CVA, legal blindness, complete heart block status post pacemaker placement, status post stent, atrial fibrillation on Eliquis, COPD, dementia, stage III CKD presented to hospital with increasing shortness of breath cough and weakness for several days with difficulty sleeping.  In the ED, patient was noted to be mildly febrile.  CBC was unremarkable.  Creatinine was mildly elevated at 1.2.  COVID PCR was positive.  Chest x-ray was negative.  Patient was then admitted to hospital for further observation.  Hospital Course:   Following conditions were addressed during hospitalization as listed below,  COVID-19 illness. Molnupiravir was given since there is contraindication to Paxlovid with Eliquis use.  This will be continued on discharge to complete 5-day course.   Generalized deconditioning and weakness secondary to COVID-19 infection.  Physical herapy saw the patient and recommended no skilled therapy needs.  Urinalysis was negative.  Influenza was negative.  Electrolytes were within normal limits.   History of atrial  flutter.  Rate controlled.  Continue Eliquis.  History of complete heart block status post pacemaker placement and stent placement.  Asymptomatic at this time.  Essential hypertension.  Controlled.  Continue amlodipine from home.  COPD not in acute exacerbation.  Continue inhalers on discharge..   Hyperlipidemia.  Continue Lipitor from home.  CKD stage IIIa.  Creatinine improved to 0.8 prior to discharge  History of BPH.  Continue tamsulosin on discharge.  Disposition.  At this time, patient is stable for disposition home with outpatient PCP follow-up.  Medical Consultants:   None.  Procedures:    None Subjective:   Today, patient was seen and examined at bedside.  Patient feels okay at this time.  Was able to ambulate without any issues.  No dyspnea or shortness of breath.  Discharge Exam:   Vitals:   03/06/21 2347 03/07/21 0336  BP: 136/62 120/77  Pulse: (!) 58 75  Resp: (!) 24 (!) 24  Temp: 98.4 F (36.9 C) 98.9 F (37.2 C)  SpO2: 96% 95%   Vitals:   03/06/21 1912 03/06/21 1947 03/06/21 2347 03/07/21 0336  BP: (!) 158/109 115/67 136/62 120/77  Pulse: (!) 57 (!) 57 (!) 58 75  Resp: (!) 30 (!) 22 (!) 24 (!) 24  Temp:  99 F (37.2 C) 98.4 F (36.9 C) 98.9 F (37.2 C)  TempSrc:  Oral Oral Oral  SpO2: 95% 98% 96% 95%  Weight:      Height:       General: Alert awake, not in obvious distress HENT: pupils equally reacting to light,  No scleral pallor or icterus noted. Oral mucosa is moist.  Legal blindness noted,  right lens opacity Chest:  Clear breath sounds.  Diminished breath sounds bilaterally. No crackles or wheezes.  CVS: S1 &S2 heard. No murmur.  Regular rate and rhythm. Abdomen: Soft, nontender, nondistended.  Bowel sounds are heard.   Extremities: No cyanosis, clubbing or edema.  Peripheral pulses are palpable. Psych: Alert, awake and oriented, normal mood CNS:  No cranial nerve deficits.  Power equal in all extremities.   Skin: Warm and dry.  No  rashes noted.  The results of significant diagnostics from this hospitalization (including imaging, microbiology, ancillary and laboratory) are listed below for reference.     Diagnostic Studies:   DG Chest 2 View  Result Date: 03/05/2021 CLINICAL DATA:  Shortness of breath. EXAM: CHEST - 2 VIEW COMPARISON:  May 22, 2019. FINDINGS: The heart size and mediastinal contours are within normal limits. Both lungs are clear. Left-sided pacemaker is unchanged in position. The visualized skeletal structures are unremarkable. IMPRESSION: No active cardiopulmonary disease. Electronically Signed   By: Marijo Conception M.D.   On: 03/05/2021 13:57     Labs:   Basic Metabolic Panel: Recent Labs  Lab 03/05/21 1256 03/07/21 0509  NA 136 136  K 3.7 4.2  CL 106 103  CO2 26 21*  GLUCOSE 102* 82  BUN 18 16  CREATININE 1.29* 0.84  CALCIUM 8.7* 8.7*  MG  --  2.1  PHOS  --  2.9   GFR Estimated Creatinine Clearance: 74 mL/min (by C-G formula based on SCr of 0.84 mg/dL). Liver Function Tests: Recent Labs  Lab 03/05/21 1256 03/07/21 0509  AST 16 21  ALT 16 14  ALKPHOS 64 49  BILITOT 1.3* 1.3*  PROT 7.7 7.2  ALBUMIN 3.8 3.2*   Recent Labs  Lab 03/05/21 1256  LIPASE 33   No results for input(s): AMMONIA in the last 168 hours. Coagulation profile No results for input(s): INR, PROTIME in the last 168 hours.  CBC: Recent Labs  Lab 03/05/21 1256 03/07/21 0509  WBC 7.0 4.6  NEUTROABS  --  2.6  HGB 15.1 15.2  HCT 47.1 48.2  MCV 93.6 95.6  PLT 168 136*   Cardiac Enzymes: No results for input(s): CKTOTAL, CKMB, CKMBINDEX, TROPONINI in the last 168 hours. BNP: Invalid input(s): POCBNP CBG: No results for input(s): GLUCAP in the last 168 hours. D-Dimer No results for input(s): DDIMER in the last 72 hours. Hgb A1c No results for input(s): HGBA1C in the last 72 hours. Lipid Profile No results for input(s): CHOL, HDL, LDLCALC, TRIG, CHOLHDL, LDLDIRECT in the last 72 hours. Thyroid  function studies No results for input(s): TSH, T4TOTAL, T3FREE, THYROIDAB in the last 72 hours.  Invalid input(s): FREET3 Anemia work up No results for input(s): VITAMINB12, FOLATE, FERRITIN, TIBC, IRON, RETICCTPCT in the last 72 hours. Microbiology Recent Results (from the past 240 hour(s))  Resp Panel by RT-PCR (Flu A&B, Covid) Nasopharyngeal Swab     Status: Abnormal   Collection Time: 03/05/21  1:27 PM   Specimen: Nasopharyngeal Swab; Nasopharyngeal(NP) swabs in vial transport medium  Result Value Ref Range Status   SARS Coronavirus 2 by RT PCR POSITIVE (A) NEGATIVE Final    Comment: (NOTE) SARS-CoV-2 target nucleic acids are DETECTED.  The SARS-CoV-2 RNA is generally detectable in upper respiratory specimens during the acute phase of infection. Positive results are indicative of the presence of the identified virus, but do not rule out bacterial infection or co-infection with other pathogens not detected by the test. Clinical correlation with patient history and  other diagnostic information is necessary to determine patient infection status. The expected result is Negative.  Fact Sheet for Patients: EntrepreneurPulse.com.au  Fact Sheet for Healthcare Providers: IncredibleEmployment.be  This test is not yet approved or cleared by the [place] FDA and  has been authorized for detection and/or diagnosis of SARS-CoV-2 by FDA under an Emergency Use Authorization (EUA).  This EUA will remain in effect (meaning this test can be used) for the duration of  the COVID-19 declaration under Section 564(b)(1) of the A ct, 21 U.S.C. section 360bbb-3(b)(1), unless the authorization is terminated or revoked sooner.     Influenza A by PCR NEGATIVE NEGATIVE Final   Influenza B by PCR NEGATIVE NEGATIVE Final    Comment: (NOTE) The Xpert Xpress SARS-CoV-2/FLU/RSV plus assay is intended as an aid in the diagnosis of influenza from Nasopharyngeal swab  specimens and should not be used as a sole basis for treatment. Nasal washings and aspirates are unacceptable for Xpert Xpress SARS-CoV-2/FLU/RSV testing.  Fact Sheet for Patients: EntrepreneurPulse.com.au  Fact Sheet for Healthcare Providers: IncredibleEmployment.be  This test is not yet approved or cleared by the [place] FDA and has been authorized for detection and/or diagnosis of SARS-CoV-2 by FDA under an Emergency Use Authorization (EUA). This EUA will remain in effect (meaning this test can be used) for the duration of the COVID-19 declaration under Section 564(b)(1) of the Act, 21 U.S.C. section 360bbb-3(b)(1), unless the authorization is terminated or revoked.  Performed at [facility], [place] [address] [place], [place] [address]      Discharge Instructions:   Discharge Instructions     Call MD for:  difficulty breathing, headache or visual disturbances   Complete by: As directed    Call MD for:  severe uncontrolled pain   Complete by: As directed    Call MD for:  temperature >100.4   Complete by: As directed    Diet - low sodium heart healthy   Complete by: As directed    Discharge instructions   Complete by: As directed    Follow up with your primary care physician in one week. No overexertion.   Increase activity slowly   Complete by: As directed       Allergies as of 03/07/2021       Reactions   Citalopram    Headache   Sertraline Diarrhea   Headache, Indigestion        Medication List     TAKE these medications    acetaminophen 500 MG tablet Commonly known as: TYLENOL Take 1,000 mg by mouth every 6 (six) hours as needed (pain).   albuterol 108 (90 Base) MCG/ACT inhaler Commonly known as: VENTOLIN HFA Inhale 2 puffs into the lungs every 6 (six) hours as needed for wheezing or shortness of breath.   allopurinol 100 MG tablet Commonly known as: ZYLOPRIM Take 100 mg by mouth  daily.   amLODipine 5 MG tablet Commonly known as: NORVASC Take 1 tablet (5 mg total) by mouth daily.   apixaban 5 MG Tabs tablet Commonly known as: ELIQUIS Take 1 tablet (5 mg total) by mouth 2 (two) times daily. Start 01/24/19 evening dose   atorvastatin 40 MG tablet Commonly known as: LIPITOR Take 40 mg by mouth at bedtime.   bismuth subsalicylate 250 NL/97QB suspension Commonly known as: PEPTO BISMOL Take 30 mLs by mouth every 6 (six) hours as needed for indigestion or diarrhea or loose stools.   brimonidine 0.2 % ophthalmic solution Commonly known as:  ALPHAGAN Place 1 drop into both eyes in the morning, at noon, and at bedtime.   dorzolamide-timolol 22.3-6.8 MG/ML ophthalmic solution Commonly known as: COSOPT Place 1 drop into both eyes Twice daily.   famotidine 40 MG tablet Commonly known as: PEPCID Take 40 mg by mouth daily.   fluticasone-salmeterol 500-50 MCG/ACT Aepb Commonly known as: ADVAIR Inhale 1 puff into the lungs in the morning and at bedtime.   furosemide 20 MG tablet Commonly known as: LASIX Take 2 tablets (40 mg total) by mouth daily. What changed:  how much to take when to take this   metoprolol tartrate 50 MG tablet Commonly known as: LOPRESSOR Take 25 mg by mouth daily.   molnupiravir EUA 200 mg Caps capsule Commonly known as: LAGEVRIO Take 4 capsules (800 mg total) by mouth 2 (two) times daily for 4 days.   polyvinyl alcohol 1.4 % ophthalmic solution Commonly known as: LIQUIFILM TEARS Place 1 drop into both eyes 4 (four) times daily as needed for dry eyes.   prednisoLONE acetate 1 % ophthalmic suspension Commonly known as: PRED FORTE Place 1 drop into both eyes daily.   tamsulosin 0.4 MG Caps capsule Commonly known as: FLOMAX Take 0.4 mg by mouth at bedtime.        Follow-up Information     [facility].   Contact information: [place] [address] [phone]          Evans Lance, MD .   Specialty: Cardiology Contact information: [phone] [address] [place] [address] [phone]                  Time coordinating discharge: 39 minutes  Signed:  Toini Failla  [facility] 03/07/2021, 10:58 AM

## 2021-03-07 NOTE — Clinical Social Work Note (Signed)
°  Transition of Care [facility]) Screening Note   Patient Details  Name: Trevor Andrews. Date of Birth: 18-Apr-1929   Transition of Care [facility]) CM/SW Contact:    Trish Mage, LCSW Phone Number: 03/07/2021, 9:20 AM    Transition of Care Department [facility]) has reviewed patient and no TOC needs have been identified at this time. We will continue to monitor patient advancement through interdisciplinary progression rounds. If new patient transition needs arise, please place a TOC consult.

## 2021-03-19 MED ORDER — HEPARIN SODIUM (PORCINE) 1000 UNIT/ML IJ SOLN
INTRAMUSCULAR | Status: AC
  Filled 2021-03-19: qty 1

## 2021-03-19 MED ORDER — BUPIVACAINE-EPINEPHRINE (PF) 0.25% -1:200000 IJ SOLN
INTRAMUSCULAR | Status: AC
  Filled 2021-03-19: qty 30

## 2021-04-19 ENCOUNTER — Ambulatory Visit (INDEPENDENT_AMBULATORY_CARE_PROVIDER_SITE_OTHER): Payer: Medicare Other

## 2021-04-19 DIAGNOSIS — I442 Atrioventricular block, complete: Secondary | ICD-10-CM

## 2021-04-19 LAB — CUP PACEART REMOTE DEVICE CHECK
Battery Remaining Longevity: 104 mo
Battery Remaining Percentage: 82 %
Battery Voltage: 3.02 V
Brady Statistic RV Percent Paced: 63 %
Date Time Interrogation Session: 20230216020014
Implantable Lead Implant Date: 20201116
Implantable Lead Implant Date: 20201116
Implantable Lead Location: 753859
Implantable Lead Location: 753860
Implantable Pulse Generator Implant Date: 20201116
Lead Channel Impedance Value: 700 Ohm
Lead Channel Pacing Threshold Amplitude: 0.5 V
Lead Channel Pacing Threshold Pulse Width: 0.5 ms
Lead Channel Sensing Intrinsic Amplitude: 5.9 mV
Lead Channel Setting Pacing Amplitude: 2.5 V
Lead Channel Setting Pacing Pulse Width: 0.5 ms
Lead Channel Setting Sensing Sensitivity: 0.7 mV
Pulse Gen Model: 2272
Pulse Gen Serial Number: 9171567

## 2021-04-24 NOTE — Progress Notes (Signed)
Remote pacemaker transmission.   

## 2021-07-19 ENCOUNTER — Ambulatory Visit (INDEPENDENT_AMBULATORY_CARE_PROVIDER_SITE_OTHER): Payer: Medicare Other

## 2021-07-19 DIAGNOSIS — I442 Atrioventricular block, complete: Secondary | ICD-10-CM

## 2021-07-19 LAB — CUP PACEART REMOTE DEVICE CHECK
Battery Remaining Longevity: 101 mo
Battery Remaining Percentage: 80 %
Battery Voltage: 3.01 V
Brady Statistic RV Percent Paced: 62 %
Date Time Interrogation Session: 20230518020013
Implantable Lead Implant Date: 20201116
Implantable Lead Implant Date: 20201116
Implantable Lead Location: 753859
Implantable Lead Location: 753860
Implantable Pulse Generator Implant Date: 20201116
Lead Channel Impedance Value: 680 Ohm
Lead Channel Pacing Threshold Amplitude: 0.5 V
Lead Channel Pacing Threshold Pulse Width: 0.5 ms
Lead Channel Sensing Intrinsic Amplitude: 5.4 mV
Lead Channel Setting Pacing Amplitude: 2.5 V
Lead Channel Setting Pacing Pulse Width: 0.5 ms
Lead Channel Setting Sensing Sensitivity: 0.7 mV
Pulse Gen Model: 2272
Pulse Gen Serial Number: 9171567

## 2021-07-31 NOTE — Progress Notes (Signed)
Remote pacemaker transmission.   

## 2021-08-04 ENCOUNTER — Emergency Department (HOSPITAL_COMMUNITY): Payer: No Typology Code available for payment source

## 2021-08-04 ENCOUNTER — Other Ambulatory Visit: Payer: Self-pay

## 2021-08-04 ENCOUNTER — Emergency Department (HOSPITAL_COMMUNITY)
Admission: EM | Admit: 2021-08-04 | Discharge: 2021-08-04 | Disposition: A | Discharge status: Home / Self Care | Payer: No Typology Code available for payment source | Attending: Emergency Medicine | Admitting: Emergency Medicine

## 2021-08-04 DIAGNOSIS — R110 Nausea: Secondary | ICD-10-CM | POA: Insufficient documentation

## 2021-08-04 DIAGNOSIS — I498 Other specified cardiac arrhythmias: Secondary | ICD-10-CM | POA: Insufficient documentation

## 2021-08-04 DIAGNOSIS — R109 Unspecified abdominal pain: Secondary | ICD-10-CM | POA: Insufficient documentation

## 2021-08-04 DIAGNOSIS — R509 Fever, unspecified: Secondary | ICD-10-CM | POA: Insufficient documentation

## 2021-08-04 DIAGNOSIS — R42 Dizziness and giddiness: Secondary | ICD-10-CM | POA: Insufficient documentation

## 2021-08-04 DIAGNOSIS — Z950 Presence of cardiac pacemaker: Secondary | ICD-10-CM

## 2021-08-04 LAB — CBC WITH DIFFERENTIAL/PLATELET
Abs Immature Granulocytes: 0.02 10*3/uL (ref 0.00–0.07)
Basophils Absolute: 0 10*3/uL (ref 0.0–0.1)
Basophils Relative: 1 %
Eosinophils Absolute: 0.1 10*3/uL (ref 0.0–0.5)
Eosinophils Relative: 2 %
HCT: 44.7 % (ref 39.0–52.0)
Hemoglobin: 15 g/dL (ref 13.0–17.0)
Immature Granulocytes: 0 %
Lymphocytes Relative: 33 %
Lymphs Abs: 1.5 10*3/uL (ref 0.7–4.0)
MCH: 30.4 pg (ref 26.0–34.0)
MCHC: 33.6 g/dL (ref 30.0–36.0)
MCV: 90.7 fL (ref 80.0–100.0)
Monocytes Absolute: 0.5 10*3/uL (ref 0.1–1.0)
Monocytes Relative: 11 %
Neutro Abs: 2.4 10*3/uL (ref 1.7–7.7)
Neutrophils Relative %: 53 %
Platelets: 184 10*3/uL (ref 150–400)
RBC: 4.93 MIL/uL (ref 4.22–5.81)
RDW: 14 % (ref 11.5–15.5)
WBC: 4.6 10*3/uL (ref 4.0–10.5)
nRBC: 0 % (ref 0.0–0.2)

## 2021-08-04 LAB — CBG MONITORING, ED: Glucose-Capillary: 121 mg/dL — ABNORMAL HIGH (ref 70–99)

## 2021-08-04 LAB — HEPATIC FUNCTION PANEL
ALT: 13 U/L (ref 0–44)
AST: 17 U/L (ref 15–41)
Albumin: 3.5 g/dL (ref 3.5–5.0)
Alkaline Phosphatase: 66 U/L (ref 38–126)
Bilirubin, Direct: 0.3 mg/dL — ABNORMAL HIGH (ref 0.0–0.2)
Indirect Bilirubin: 0.6 mg/dL (ref 0.3–0.9)
Total Bilirubin: 0.9 mg/dL (ref 0.3–1.2)
Total Protein: 7 g/dL (ref 6.5–8.1)

## 2021-08-04 LAB — BASIC METABOLIC PANEL
Anion gap: 7 (ref 5–15)
BUN: 12 mg/dL (ref 8–23)
CO2: 25 mmol/L (ref 22–32)
Calcium: 9.2 mg/dL (ref 8.9–10.3)
Chloride: 105 mmol/L (ref 98–111)
Creatinine, Ser: 1.17 mg/dL (ref 0.61–1.24)
GFR, Estimated: 60 mL/min — ABNORMAL LOW (ref >60)
Glucose, Bld: 115 mg/dL — ABNORMAL HIGH (ref 70–99)
Potassium: 4 mmol/L (ref 3.5–5.1)
Sodium: 137 mmol/L (ref 135–145)

## 2021-08-04 LAB — TROPONIN I (HIGH SENSITIVITY): Troponin I (High Sensitivity): 10 ng/L (ref <18)

## 2021-08-04 LAB — MAGNESIUM: Magnesium: 2 mg/dL (ref 1.7–2.4)

## 2021-08-04 LAB — LIPASE, BLOOD: Lipase: 35 U/L (ref 11–51)

## 2021-08-04 MED ORDER — MECLIZINE HCL 25 MG PO TABS
25.0000 mg | ORAL_TABLET | Freq: Once | ORAL | Status: AC
Start: 2021-08-04 — End: 2021-08-04
  Administered 2021-08-04: 25 mg via ORAL
  Filled 2021-08-04: qty 1

## 2021-08-04 MED ORDER — SODIUM CHLORIDE 0.9 % IV BOLUS
1000.0000 mL | Freq: Once | INTRAVENOUS | Status: AC
  Administered 2021-08-04: 1000 mL via INTRAVENOUS

## 2021-08-04 NOTE — Discharge Instructions (Signed)
Please follow-up with your cardiologist in the office.  Please let your family doctor know about your visit to the ER and see when they want to see you again.  Please return here for worsening symptoms.

## 2021-08-04 NOTE — ED Triage Notes (Signed)
Pt states that he has been having some abd pain and nausea for a couple days. Also states fevers at home. Last took tylenol at 5am

## 2021-08-04 NOTE — ED Provider Notes (Signed)
[facility] EMERGENCY DEPARTMENT Provider Note   CSN: [id number] Arrival date & time: 08/04/21  1505     History  Chief Complaint  Patient presents with   Abdominal Pain   Fever   Nausea    Trevor Andrews. is a 88 y.o. male.  88 yo M with a chief complaints of dizziness.  This been going on for about a week or so.  He also felt like his heart has been beating a bit funny.  Has been coughing a little bit.  Denies chest pain denies pressure denies abdominal pain denies fevers.  Feels like he has been eating and drinking normally.  Has a history of vertigo and thinks this feels somewhat similar.       Home Medications Prior to Admission medications   Medication Sig Start Date End Date Taking? Authorizing Provider  acetaminophen (TYLENOL) 500 MG tablet Take 1,000 mg by mouth every 6 (six) hours as needed (pain).    [provider]  albuterol (VENTOLIN HFA) 108 (90 Base) MCG/ACT inhaler Inhale 2 puffs into the lungs every 6 (six) hours as needed for wheezing or shortness of breath. 03/07/21   Pokhrel, Corrie Mckusick, MD  allopurinol (ZYLOPRIM) 100 MG tablet Take 100 mg by mouth daily.     [provider]  amLODipine (NORVASC) 5 MG tablet Take 1 tablet (5 mg total) by mouth daily. 09/08/17   Glenis Smoker, MD  apixaban (ELIQUIS) 5 MG TABS tablet Take 1 tablet (5 mg total) by mouth 2 (two) times daily. Start 01/24/19 evening dose 01/19/19 05/12/21  Chanetta Marshall K, NP  atorvastatin (LIPITOR) 40 MG tablet Take 40 mg by mouth at bedtime.     [provider]  bismuth subsalicylate (PEPTO BISMOL) 262 MG/15ML suspension Take 30 mLs by mouth every 6 (six) hours as needed for indigestion or diarrhea or loose stools.    [provider]  brimonidine (ALPHAGAN) 0.2 % ophthalmic solution Place 1 drop into both eyes in the morning, at noon, and at bedtime. 10/07/20   [provider]  dorzolamide-timolol (COSOPT) 22.3-6.8 MG/ML ophthalmic  solution Place 1 drop into both eyes Twice daily.  04/21/11   [provider]  famotidine (PEPCID) 40 MG tablet Take 40 mg by mouth daily. 01/23/21   [provider]  fluticasone-salmeterol (ADVAIR) 500-50 MCG/ACT AEPB Inhale 1 puff into the lungs in the morning and at bedtime. 01/26/21   [provider]  furosemide (LASIX) 20 MG tablet Take 2 tablets (40 mg total) by mouth daily. Patient taking differently: Take 20 mg by mouth 2 (two) times daily. 06/14/19   Shirley Friar, PA-C  metoprolol tartrate (LOPRESSOR) 50 MG tablet Take 25 mg by mouth daily.    [provider]  polyvinyl alcohol (LIQUIFILM TEARS) 1.4 % ophthalmic solution Place 1 drop into both eyes 4 (four) times daily as needed for dry eyes. 02/09/21   [provider]  prednisoLONE acetate (PRED FORTE) 1 % ophthalmic suspension Place 1 drop into both eyes daily.  04/12/11   [provider]  tamsulosin (FLOMAX) 0.4 MG CAPS capsule Take 0.4 mg by mouth at bedtime.    [provider]      Allergies    Citalopram and Sertraline    Review of Systems   Review of Systems  Physical Exam Updated Vital Signs BP (!) 159/85   Pulse (!) 41   Temp 98.1 F (36.7 C) (Oral)   Resp (!) 22  SpO2 97%  Physical Exam Vitals and nursing note reviewed.  Constitutional:      Appearance: He is well-developed.  HENT:     Head: Normocephalic and atraumatic.  Eyes:     Pupils: Pupils are equal, round, and reactive to light.  Neck:     Vascular: No JVD.  Cardiovascular:     Rate and Rhythm: Normal rate. Rhythm irregular.     Heart sounds: No murmur heard.   No friction rub. No gallop.  Pulmonary:     Effort: No respiratory distress.     Breath sounds: No wheezing.  Abdominal:     General: There is no distension.     Tenderness: There is no abdominal tenderness. There is no guarding or rebound.  Musculoskeletal:        General: Normal range of motion.     Cervical back:  Normal range of motion and neck supple.  Skin:    Coloration: Skin is not pale.     Findings: No rash.  Neurological:     Mental Status: He is alert and oriented to person, place, and time.  Psychiatric:        Behavior: Behavior normal.    ED Results / Procedures / Treatments   Labs (all labs ordered are listed, but only abnormal results are displayed) Labs Reviewed  BASIC METABOLIC PANEL - Abnormal; Notable for the following components:      Result Value   Glucose, Bld 115 (*)    GFR, Estimated 60 (*)    All other components within normal limits  HEPATIC FUNCTION PANEL - Abnormal; Notable for the following components:   Bilirubin, Direct 0.3 (*)    All other components within normal limits  CBG MONITORING, ED - Abnormal; Notable for the following components:   Glucose-Capillary 121 (*)    All other components within normal limits  CBC WITH DIFFERENTIAL/PLATELET  MAGNESIUM  LIPASE, BLOOD  TROPONIN I (HIGH SENSITIVITY)    EKG EKG Interpretation  Date/Time:  Saturday August 04 2021 15:19:30 EDT Ventricular Rate:  77 PR Interval:    QRS Duration: 151 QT Interval:  439 QTC Calculation: 435 R Axis:   -43 Text Interpretation: Atrial fibrillation Ventricular tachycardia, unsustained Left bundle branch block No significant change since last tracing Confirmed by Deno Etienne [phone]) on 08/04/2021 3:21:41 PM  Radiology DG Chest Port 1 View  Result Date: 08/04/2021 CLINICAL DATA:  Palpitations and dizziness. EXAM: PORTABLE CHEST 1 VIEW COMPARISON:  03/05/2021 FINDINGS: Pacer with leads at right atrium and right ventricle. No lead discontinuity. Patient rotated minimally right. Midline trachea. Mild cardiomegaly. Moderate left hemidiaphragm elevation. No pleural effusion or pneumothorax. No congestive failure. Clear lungs. IMPRESSION: 1. No acute cardiopulmonary disease. 2. Cardiomegaly without congestive failure. Electronically Signed   By: Abigail Miyamoto M.D.   On: 08/04/2021 16:14     Procedures .1-3 Lead EKG Interpretation Performed by: Deno Etienne, DO Authorized by: Deno Etienne, DO     Interpretation: abnormal     ECG rate:  33   ECG rate assessment: bradycardic     Rhythm: atrial flutter     Ectopy: PVCs     Conduction: normal   Comments:     Having some significant pauses up to 12 seconds.    Medications Ordered in ED Medications  sodium chloride 0.9 % bolus 1,000 mL (0 mLs Intravenous Stopped 08/04/21 1924)  meclizine (ANTIVERT) tablet 25 mg (25 mg Oral Given 08/04/21 1550)    ED Course/ Medical Decision Making/ A&P  Medical Decision Making Amount and/or Complexity of Data Reviewed Labs: ordered. Radiology: ordered.   88 yo M with a chief complaints of dizziness.  This has been going on for little while now.  He says it feels like when he had had vertigo in the past.  We will give a bolus of IV fluids check blood work chest x-ray interrogate pacemaker reassess.  Initially when I asked the patient if he is having any abdominal pain he said no.  The nursing notes mention that he has been having some abdominal pain for couple days.  When asked him again directly he told me that its not really pain it is felt a little bit funny off and on.  Will add on LFTs and lipase.   Patient's blood work is resulted and has no significant finding, his LFTs and lipase are unremarkable no significant electrolyte abnormality renal function appears to be at baseline.  Not anemic no leukocytosis.  I discussed results with the patient.  He elucidated his history little bit more about his abdomen.  Tells me that he has been having loose stools.  The patient is having some periods of bradycardia on the monitor rates going down into the 30s and sometimes having prolonged pauses.  Pacemaker report on my read without obvious mention of this.  Will discuss with [facility] Jude rep.  [employer] rep to come in to evaluate at bedside.  Patient reassessed and  feeling much better.    [employer] rep at bedside.  Manually interrogated trouble shot the device without any obvious issue.  He reviewed the telemetry monitoring and was unable to see that through the pacemaker report.  Suspects may be a local monitoring error.  As the patient is feeling better we will have him follow-up with the cardiologist in the office.  8:28 PM:  I have discussed the diagnosis/risks/treatment options with the patient and family.  Evaluation and diagnostic testing in the emergency department does not suggest an emergent condition requiring admission or immediate intervention beyond what has been performed at this time.  They will follow up with  PCP, Cards. We also discussed returning to the ED immediately if new or worsening sx occur. We discussed the sx which are most concerning (e.g., sudden worsening pain, fever, inability to tolerate by mouth) that necessitate immediate return. Medications administered to the patient during their visit and any new prescriptions provided to the patient are listed below.  Medications given during this visit Medications  sodium chloride 0.9 % bolus 1,000 mL (0 mLs Intravenous Stopped 08/04/21 1924)  meclizine (ANTIVERT) tablet 25 mg (25 mg Oral Given 08/04/21 1550)     The patient appears reasonably screen and/or stabilized for discharge and I doubt any other medical condition or other [facility] requiring further screening, evaluation, or treatment in the ED at this time prior to discharge.         Final Clinical Impression(s) / ED Diagnoses Final diagnoses:  Dizziness  Pacemaker    Rx / DC Orders ED Discharge Orders          Ordered    Ambulatory referral to Cardiology       Comments: If you have not heard from the Cardiology office within the next 72 hours please call [phone].   08/04/21 2027              Deno Etienne, DO 08/04/21 2028

## 2021-08-22 ENCOUNTER — Encounter: Payer: Self-pay | Admitting: Internal Medicine

## 2021-08-22 ENCOUNTER — Ambulatory Visit: Payer: Medicare Other | Admitting: Internal Medicine

## 2021-08-22 VITALS — BP 114/68 | HR 67 | Ht 73.5 in | Wt 209.0 lb

## 2021-08-22 DIAGNOSIS — I5032 Chronic diastolic (congestive) heart failure: Secondary | ICD-10-CM

## 2021-08-22 DIAGNOSIS — I442 Atrioventricular block, complete: Secondary | ICD-10-CM | POA: Diagnosis not present

## 2021-08-22 DIAGNOSIS — Z950 Presence of cardiac pacemaker: Secondary | ICD-10-CM

## 2021-08-22 DIAGNOSIS — I10 Essential (primary) hypertension: Secondary | ICD-10-CM | POA: Diagnosis not present

## 2021-08-22 NOTE — Patient Instructions (Signed)
Medication Instructions:  Your physician recommends that you continue on your current medications as directed. Please refer to the Current Medication list given to you today.  Labwork: None ordered.  Testing/Procedures: None ordered.  Follow-Up: Your physician wants you to follow-up in: one year with Cristopher Peru, MD or one of the following Advanced Practice Providers on your designated Care Team:   Tommye Standard, [place] Legrand Como "Jonni Sanger" Chalmers Cater, [place]  Remote monitoring is used to monitor your Pacemaker from home. This monitoring reduces the number of office visits required to check your device to one time per year. It allows [place] to keep an eye on the functioning of your device to ensure it is working properly. You are scheduled for a device check from home on 10/19/2021. You may send your transmission at any time that day. If you have a wireless device, the transmission will be sent automatically. After your physician reviews your transmission, you will receive a postcard with your next transmission date.  Any Other Special Instructions Will Be Listed Below (If Applicable).  If you need a refill on your cardiac medications before your next appointment, please call your pharmacy.   Important Information About Sugar

## 2021-08-22 NOTE — Progress Notes (Signed)
HPI [name] returns today for followup. He is a pleasant 88 yo man with CHB, s/p PPM, HTN, diastolic heart failure, typical atrial flutter and HTN. He has done well in the interim. Despite his advanced age, he tries to stay active. He has class 2 symptoms. He has not had syncope. No angina. He drinks a single ETOH beverage daily. No syncope. Allergies  Allergen Reactions   Citalopram     Headache   Sertraline Diarrhea    Headache, Indigestion     Current Outpatient Medications  Medication Sig Dispense Refill   acetaminophen (TYLENOL) 500 MG tablet Take 1,000 mg by mouth every 6 (six) hours as needed (pain).     albuterol (VENTOLIN HFA) 108 (90 Base) MCG/ACT inhaler Inhale 2 puffs into the lungs every 6 (six) hours as needed for wheezing or shortness of breath. 8 g 2   allopurinol (ZYLOPRIM) 100 MG tablet Take 100 mg by mouth daily.      amLODipine (NORVASC) 5 MG tablet Take 1 tablet (5 mg total) by mouth daily. 90 tablet 3   apixaban (ELIQUIS) 5 MG TABS tablet Take 1 tablet (5 mg total) by mouth 2 (two) times daily. Start 01/24/19 evening dose 60 tablet 3   atorvastatin (LIPITOR) 40 MG tablet Take 40 mg by mouth at bedtime.      bismuth subsalicylate (PEPTO BISMOL) 262 MG/15ML suspension Take 30 mLs by mouth every 6 (six) hours as needed for indigestion or diarrhea or loose stools.     brimonidine (ALPHAGAN) 0.2 % ophthalmic solution Place 1 drop into both eyes in the morning, at noon, and at bedtime.     dorzolamide-timolol (COSOPT) 22.3-6.8 MG/ML ophthalmic solution Place 1 drop into both eyes Twice daily.      famotidine (PEPCID) 40 MG tablet Take 40 mg by mouth daily.     fluticasone-salmeterol (ADVAIR) 500-50 MCG/ACT AEPB Inhale 1 puff into the lungs in the morning and at bedtime.     furosemide (LASIX) 20 MG tablet Take 2 tablets (40 mg total) by mouth daily. (Patient taking differently: Take 20 mg by mouth 2 (two) times daily.) 60 tablet 11   metoprolol tartrate (LOPRESSOR)  50 MG tablet Take 25 mg by mouth daily.     polyvinyl alcohol (LIQUIFILM TEARS) 1.4 % ophthalmic solution Place 1 drop into both eyes 4 (four) times daily as needed for dry eyes.     prednisoLONE acetate (PRED FORTE) 1 % ophthalmic suspension Place 1 drop into both eyes daily.      tamsulosin (FLOMAX) 0.4 MG CAPS capsule Take 0.4 mg by mouth at bedtime.     No current facility-administered medications for this visit.     Past Medical History:  Diagnosis Date   Arthritis    Blindness    due to glaucoma and cataract   Cancer ([place]) 1998   Prostate. followed by urology   CHF (congestive heart failure) ([place])    CKD (chronic kidney disease), stage II    Coronary artery disease    Had stent placed 2015???   Dementia ([place])    Diabetes mellitus ([place])    GERD (gastroesophageal reflux disease)    Hypercholesteremia    Hypertension    Shortness of breath dyspnea    Stroke ([place]) 2016   a. on ASA/Plavix - felt 2/2 small vessel disease source per neuro notes.   Vertigo     ROS:   All systems reviewed and negative except as noted in the HPI.  Past Surgical History:  Procedure Laterality Date   CATARACT EXTRACTION  2011 and 2012   2011lt eye and rt eye 2012   CORONARY ANGIOPLASTY WITH STENT PLACEMENT  2015???   glauma implant  2012   rt.eye   PACEMAKER IMPLANT N/A 01/18/2019   Procedure: PACEMAKER IMPLANT;  Surgeon: Evans Lance, MD;  Location: [place] [facility];  Service: Cardiovascular;  Laterality: N/A;   PROSTATECTOMY  1998     Family History  Problem Relation Age of Onset   Premature CHD Neg Hx      Social History   Socioeconomic History   Marital status: Widowed    Spouse name: Not on file   Number of children: Not on file   Years of education: Not on file   Highest education level: Not on file  Occupational History   Not on file  Tobacco Use   Smoking status: Never   Smokeless tobacco: Never  Substance and Sexual Activity   Alcohol use: Yes     Alcohol/week: 2.0 standard drinks of alcohol    Types: 2 Shots of liquor per week    Comment: rare   Drug use: No   Sexual activity: Not Currently  Other Topics Concern   Not on file  Social History Narrative   Retired English as a second language teacher   Lives with daughter   Social Determinants of Health   Financial Resource Strain: Not on file  Food Insecurity: Not on file  Transportation Needs: Not on file  Physical Activity: Not on file  Stress: Not on file  Social Connections: Not on file  Intimate Partner Violence: Not on file     BP 114/68   Pulse 67   Ht 6' 1.5" (1.867 m)   Wt 209 lb (94.8 kg)   SpO2 95%   BMI 27.20 kg/m   Physical Exam:  Well appearing NAD HEENT: Unremarkable Neck:  No JVD, no thyromegally Lymphatics:  No adenopathy Back:  No CVA tenderness Lungs:  Clear with no wheezes HEART:  Regular rate rhythm, no murmurs, no rubs, no clicks Abd:  soft, positive bowel sounds, no organomegally, no rebound, no guarding Ext:  2 plus pulses, no edema, no cyanosis, no clubbing Skin:  No rashes no nodules Neuro:  CN II through XII intact, motor grossly intact  DEVICE  Normal device function.  See PaceArt for details.   A/P 1. Atrial flutter - he is asymptomatic and his rate is controlled. He will continue eliquis. 2. PPM - his [employer] PPM is mostly working normally. He has some atrial undersensing and we have reprogrammed him VVIR today. 3. Diastolic heart failure -he has class 2 symptoms. I asked him to avoid salty foods. 4. HTN - his bp is well controlled today. No change in meds. He admits to sodium indiscretion and I asked him to work on this.   Carleene Overlie Doneen Ollinger,MD

## 2021-12-01 ENCOUNTER — Other Ambulatory Visit: Payer: Self-pay

## 2021-12-01 ENCOUNTER — Emergency Department (HOSPITAL_COMMUNITY): Payer: No Typology Code available for payment source

## 2021-12-01 ENCOUNTER — Emergency Department (HOSPITAL_COMMUNITY)
Admission: EM | Admit: 2021-12-01 | Discharge: 2021-12-01 | Disposition: A | Discharge status: Home / Self Care | Payer: No Typology Code available for payment source | Attending: Emergency Medicine | Admitting: Emergency Medicine

## 2021-12-01 ENCOUNTER — Encounter (HOSPITAL_COMMUNITY): Payer: Self-pay

## 2021-12-01 DIAGNOSIS — Z7901 Long term (current) use of anticoagulants: Secondary | ICD-10-CM | POA: Diagnosis not present

## 2021-12-01 DIAGNOSIS — Z79899 Other long term (current) drug therapy: Secondary | ICD-10-CM | POA: Diagnosis not present

## 2021-12-01 DIAGNOSIS — Z955 Presence of coronary angioplasty implant and graft: Secondary | ICD-10-CM | POA: Diagnosis not present

## 2021-12-01 DIAGNOSIS — E1122 Type 2 diabetes mellitus with diabetic chronic kidney disease: Secondary | ICD-10-CM | POA: Diagnosis not present

## 2021-12-01 DIAGNOSIS — Z950 Presence of cardiac pacemaker: Secondary | ICD-10-CM | POA: Insufficient documentation

## 2021-12-01 DIAGNOSIS — E162 Hypoglycemia, unspecified: Secondary | ICD-10-CM

## 2021-12-01 DIAGNOSIS — R42 Dizziness and giddiness: Secondary | ICD-10-CM | POA: Diagnosis present

## 2021-12-01 DIAGNOSIS — Y900 Blood alcohol level of less than 20 mg/100 ml: Secondary | ICD-10-CM | POA: Diagnosis not present

## 2021-12-01 DIAGNOSIS — N189 Chronic kidney disease, unspecified: Secondary | ICD-10-CM | POA: Diagnosis not present

## 2021-12-01 DIAGNOSIS — G529 Cranial nerve disorder, unspecified: Secondary | ICD-10-CM | POA: Insufficient documentation

## 2021-12-01 DIAGNOSIS — E11649 Type 2 diabetes mellitus with hypoglycemia without coma: Secondary | ICD-10-CM | POA: Insufficient documentation

## 2021-12-01 DIAGNOSIS — F0390 Unspecified dementia without behavioral disturbance: Secondary | ICD-10-CM | POA: Diagnosis not present

## 2021-12-01 DIAGNOSIS — I509 Heart failure, unspecified: Secondary | ICD-10-CM | POA: Insufficient documentation

## 2021-12-01 DIAGNOSIS — I130 Hypertensive heart and chronic kidney disease with heart failure and stage 1 through stage 4 chronic kidney disease, or unspecified chronic kidney disease: Secondary | ICD-10-CM | POA: Insufficient documentation

## 2021-12-01 DIAGNOSIS — I2510 Atherosclerotic heart disease of native coronary artery without angina pectoris: Secondary | ICD-10-CM | POA: Diagnosis not present

## 2021-12-01 DIAGNOSIS — I10 Essential (primary) hypertension: Secondary | ICD-10-CM

## 2021-12-01 LAB — DIFFERENTIAL
Abs Immature Granulocytes: 0 10*3/uL (ref 0.00–0.07)
Basophils Absolute: 0 10*3/uL (ref 0.0–0.1)
Basophils Relative: 1 %
Eosinophils Absolute: 0.1 10*3/uL (ref 0.0–0.5)
Eosinophils Relative: 2 %
Immature Granulocytes: 0 %
Lymphocytes Relative: 36 %
Lymphs Abs: 1.4 10*3/uL (ref 0.7–4.0)
Monocytes Absolute: 0.5 10*3/uL (ref 0.1–1.0)
Monocytes Relative: 12 %
Neutro Abs: 1.9 10*3/uL (ref 1.7–7.7)
Neutrophils Relative %: 49 %

## 2021-12-01 LAB — CBG MONITORING, ED
Glucose-Capillary: 66 mg/dL — ABNORMAL LOW (ref 70–99)
Glucose-Capillary: 145 mg/dL — ABNORMAL HIGH (ref 70–99)

## 2021-12-01 LAB — COMPREHENSIVE METABOLIC PANEL
ALT: 14 U/L (ref 0–44)
AST: 17 U/L (ref 15–41)
Albumin: 3.5 g/dL (ref 3.5–5.0)
Alkaline Phosphatase: 56 U/L (ref 38–126)
Anion gap: 7 (ref 5–15)
BUN: 12 mg/dL (ref 8–23)
CO2: 26 mmol/L (ref 22–32)
Calcium: 9.1 mg/dL (ref 8.9–10.3)
Chloride: 106 mmol/L (ref 98–111)
Creatinine, Ser: 1.21 mg/dL (ref 0.61–1.24)
GFR, Estimated: 57 mL/min — ABNORMAL LOW (ref >60)
Glucose, Bld: 98 mg/dL (ref 70–99)
Potassium: 3.9 mmol/L (ref 3.5–5.1)
Sodium: 139 mmol/L (ref 135–145)
Total Bilirubin: 1 mg/dL (ref 0.3–1.2)
Total Protein: 7 g/dL (ref 6.5–8.1)

## 2021-12-01 LAB — PROTIME-INR
INR: 1.2 (ref 0.8–1.2)
Prothrombin Time: 15.3 seconds — ABNORMAL HIGH (ref 11.4–15.2)

## 2021-12-01 LAB — CBC
HCT: 43.9 % (ref 39.0–52.0)
Hemoglobin: 14.7 g/dL (ref 13.0–17.0)
MCH: 30.3 pg (ref 26.0–34.0)
MCHC: 33.5 g/dL (ref 30.0–36.0)
MCV: 90.5 fL (ref 80.0–100.0)
Platelets: 165 10*3/uL (ref 150–400)
RBC: 4.85 MIL/uL (ref 4.22–5.81)
RDW: 14.7 % (ref 11.5–15.5)
WBC: 3.9 10*3/uL — ABNORMAL LOW (ref 4.0–10.5)
nRBC: 0 % (ref 0.0–0.2)

## 2021-12-01 LAB — APTT: aPTT: 42 seconds — ABNORMAL HIGH (ref 24–36)

## 2021-12-01 LAB — ETHANOL: Alcohol, Ethyl (B): <10 mg/dL (ref <10)

## 2021-12-01 MED ORDER — MECLIZINE HCL 25 MG PO TABS
25.0000 mg | ORAL_TABLET | Freq: Once | ORAL | Status: AC
  Administered 2021-12-01: 25 mg via ORAL
  Filled 2021-12-01: qty 1

## 2021-12-01 MED ORDER — SODIUM CHLORIDE 0.9% FLUSH: 3.0000 mL | Freq: Once | INTRAVENOUS | Status: DC

## 2021-12-01 MED ORDER — MECLIZINE HCL 25 MG PO TABS: 25.0000 mg | ORAL_TABLET | Freq: Three times a day (TID) | ORAL | 0 refills | Status: AC | PRN

## 2021-12-01 MED ORDER — MECLIZINE HCL 25 MG PO TABS: 25.0000 mg | ORAL_TABLET | Freq: Three times a day (TID) | ORAL | 0 refills | Status: DC | PRN

## 2021-12-01 NOTE — Discharge Instructions (Addendum)
Sugars back up to the 140s.  That is good.  Take the Antivert as needed for the dizziness.  Make an appointment to follow-up with [facility] neurology information provided above.  Follow back up with your doctors regarding blood pressure.  Blood pressure here today is marginal and needs to be trended.  Make sure you take your blood pressure medications as prescribed.  Return for any new or worse symptoms.

## 2021-12-01 NOTE — ED Notes (Signed)
Patient given a sandwich bag.

## 2021-12-01 NOTE — ED Triage Notes (Signed)
Patient has hx of vertigo and developed recurrent dizziness that he describes as positional yesterday. Today ongoing. Patient is legally blind, no headache, speech clear and no drift

## 2021-12-01 NOTE — ED Provider Triage Note (Signed)
Emergency Medicine Provider Triage Evaluation Note  Trevor Andrews. , a 88 y.o. male  was evaluated in triage.  Pt complains of dizzy. Dizzy/lightheadedness since yesterday, brought on with movement.  Felt similar to prior.  No focal numbness or focal weakness.  No headache  Review of Systems  Positive: As above Negative: As above  Physical Exam  BP (!) 155/92   Pulse (!) 56   Temp 98 F (36.7 C) (Oral)   Resp 17   SpO2 98%  Gen:   Awake, no distress   Resp:  Normal effort  MSK:   Moves extremities without difficulty  Other:    Medical Decision Making  Medically screening exam initiated at 10:43 AM.  Appropriate orders placed.  Trevor Andrews. was informed that the remainder of the evaluation will be completed by another provider, this initial triage assessment does not replace that evaluation, and the importance of remaining in the ED until their evaluation is complete.     Domenic Moras, PA-C 12/01/21 1047

## 2021-12-01 NOTE — ED Provider Notes (Addendum)
[facility] EMERGENCY DEPARTMENT Provider Note   CSN: [id number] Arrival date & time: 12/01/21  1016     History  No chief complaint on file.   Trevor Ropp. is a 88 y.o. male.  Patient brought in by EMS patient's had a history of vertigo in the past.  Developed recurrent dizziness said he had some room spinning.  Similar to what he had in the past.  Patient is legally blind no headache speech is fine no weakness or numbness.  Patient has a history significant for chronic kidney disease diabetes hypertension high cholesterol dementia stroke in 2016 congestive heart failure coronary artery disease has a pacemaker.  Past surgical history significant for prostatectomy and 89 coronary angioplasty with stent in 2015 pacemaker implant in 2020.  Patient does not use any tobacco products.       Home Medications Prior to Admission medications   Medication Sig Start Date End Date Taking? Authorizing Provider  acetaminophen (TYLENOL) 500 MG tablet Take 1,000 mg by mouth every 6 (six) hours as needed (pain).    [provider]  albuterol (VENTOLIN HFA) 108 (90 Base) MCG/ACT inhaler Inhale 2 puffs into the lungs every 6 (six) hours as needed for wheezing or shortness of breath. 03/07/21   Pokhrel, Corrie Mckusick, MD  allopurinol (ZYLOPRIM) 100 MG tablet Take 100 mg by mouth daily.     [provider]  amLODipine (NORVASC) 5 MG tablet Take 1 tablet (5 mg total) by mouth daily. 09/08/17   Glenis Smoker, MD  apixaban (ELIQUIS) 5 MG TABS tablet Take 1 tablet (5 mg total) by mouth 2 (two) times daily. Start 01/24/19 evening dose 01/19/19 08/22/21  Chanetta Marshall K, NP  atorvastatin (LIPITOR) 40 MG tablet Take 40 mg by mouth at bedtime.     [provider]  bismuth subsalicylate (PEPTO BISMOL) 262 MG/15ML suspension Take 30 mLs by mouth every 6 (six) hours as needed for indigestion or diarrhea or loose stools.    [provider]  brimonidine (ALPHAGAN)  0.2 % ophthalmic solution Place 1 drop into both eyes in the morning, at noon, and at bedtime. 10/07/20   [provider]  dorzolamide-timolol (COSOPT) 22.3-6.8 MG/ML ophthalmic solution Place 1 drop into both eyes Twice daily.  04/21/11   [provider]  famotidine (PEPCID) 40 MG tablet Take 40 mg by mouth daily. 01/23/21   [provider]  fluticasone-salmeterol (ADVAIR) 500-50 MCG/ACT AEPB Inhale 1 puff into the lungs in the morning and at bedtime. 01/26/21   [provider]  furosemide (LASIX) 20 MG tablet Take 2 tablets (40 mg total) by mouth daily. Patient taking differently: Take 20 mg by mouth 2 (two) times daily. 06/14/19   Shirley Friar, PA-C  metoprolol tartrate (LOPRESSOR) 50 MG tablet Take 25 mg by mouth daily.    [provider]  polyvinyl alcohol (LIQUIFILM TEARS) 1.4 % ophthalmic solution Place 1 drop into both eyes 4 (four) times daily as needed for dry eyes. 02/09/21   [provider]  prednisoLONE acetate (PRED FORTE) 1 % ophthalmic suspension Place 1 drop into both eyes daily.  04/12/11   [provider]  tamsulosin (FLOMAX) 0.4 MG CAPS capsule Take 0.4 mg by mouth at bedtime.    [provider]      Allergies    Citalopram and Sertraline    Review of Systems   Review of Systems  Constitutional:  Negative for chills and fever.  HENT:  Negative for  ear pain and sore throat.   Eyes:  Positive for visual disturbance. Negative for pain.  Respiratory:  Negative for cough and shortness of breath.   Cardiovascular:  Negative for chest pain and palpitations.  Gastrointestinal:  Negative for abdominal pain and vomiting.  Genitourinary:  Negative for dysuria and hematuria.  Musculoskeletal:  Negative for arthralgias and back pain.  Skin:  Negative for color change and rash.  Neurological:  Positive for dizziness. Negative for seizures, syncope, weakness and numbness.  All other systems reviewed and are  negative.   Physical Exam Updated Vital Signs BP (!) 177/93   Pulse (!) 54   Temp 97.9 F (36.6 C) (Oral)   Resp 19   SpO2 98%  Physical Exam Vitals and nursing note reviewed.  Constitutional:      General: He is not in acute distress.    Appearance: Normal appearance. He is well-developed.  HENT:     Head: Normocephalic and atraumatic.  Eyes:     Comments: Patient with minimal vision in both eyes.  Not able to see the fingers up close.  Cardiovascular:     Rate and Rhythm: Normal rate and regular rhythm.     Heart sounds: No murmur heard. Pulmonary:     Effort: Pulmonary effort is normal. No respiratory distress.     Breath sounds: Normal breath sounds.  Abdominal:     Palpations: Abdomen is soft.     Tenderness: There is no abdominal tenderness.  Musculoskeletal:        General: No swelling.     Cervical back: Normal range of motion and neck supple. No rigidity.     Right lower leg: No edema.     Left lower leg: No edema.  Skin:    General: Skin is warm and dry.     Capillary Refill: Capillary refill takes less than 2 seconds.  Neurological:     General: No focal deficit present.     Mental Status: He is alert and oriented to person, place, and time.     Cranial Nerves: Cranial nerve deficit present.     Sensory: No sensory deficit.     Motor: No weakness.     Comments: Essentially blind but able to see a little bit of light.  Psychiatric:        Mood and Affect: Mood normal.     ED Results / Procedures / Treatments   Labs (all labs ordered are listed, but only abnormal results are displayed) Labs Reviewed  PROTIME-INR - Abnormal; Notable for the following components:      Result Value   Prothrombin Time 15.3 (*)    All other components within normal limits  APTT - Abnormal; Notable for the following components:   aPTT 42 (*)    All other components within normal limits  CBC - Abnormal; Notable for the following components:   WBC 3.9 (*)    All other  components within normal limits  COMPREHENSIVE METABOLIC PANEL - Abnormal; Notable for the following components:   GFR, Estimated 57 (*)    All other components within normal limits  CBG MONITORING, ED - Abnormal; Notable for the following components:   Glucose-Capillary 66 (*)    All other components within normal limits  DIFFERENTIAL  ETHANOL    EKG EKG Interpretation  Date/Time:  Saturday December 01 2021 10:36:14 EDT Ventricular Rate:  65 PR Interval:    QRS Duration: 158 QT Interval:  450 QTC Calculation: 468 R Axis:   -  32 Text Interpretation: Ventricular-paced rhythm with frequent Premature ventricular complexes Abnormal ECG When compared with ECG of 04-Aug-2021 15:19, PREVIOUS ECG IS PRESENT Confirmed by Fredia Sorrow [phone]) on 12/01/2021 4:20:10 PM  Radiology CT HEAD WO CONTRAST  Result Date: 12/01/2021 CLINICAL DATA:  Stroke suspected.  Neuro deficit.  Lightheadedness EXAM: CT HEAD WITHOUT CONTRAST TECHNIQUE: Contiguous axial images were obtained from the base of the skull through the vertex without intravenous contrast. RADIATION DOSE REDUCTION: This exam was performed according to the departmental dose-optimization program which includes automated exposure control, adjustment of the mA and/or kV according to patient size and/or use of iterative reconstruction technique. COMPARISON:  None Available. FINDINGS: Brain: No acute intracranial hemorrhage. No focal mass lesion. No CT evidence of acute infarction. No midline shift or mass effect. No hydrocephalus. Basilar cisterns are patent. There are periventricular and subcortical white matter hypodensities. Generalized cortical atrophy. Vascular: No hyperdense vessel or unexpected calcification. Skull: Normal. Negative for fracture or focal lesion. Sinuses/Orbits: Paranasal sinuses and mastoid air cells are clear. Orbits are clear. Other: None. IMPRESSION: 1. No acute intracranial findings. 2. Atrophy and white matter  microvascular disease. Electronically Signed   By: Suzy Bouchard M.D.   On: 12/01/2021 11:09    Procedures Procedures    Medications Ordered in ED Medications  sodium chloride flush (NS) 0.9 % injection 3 mL (has no administration in time range)  meclizine (ANTIVERT) tablet 25 mg (25 mg Oral Given 12/01/21 1433)    ED Course/ Medical Decision Making/ A&P                           Medical Decision Making Amount and/or Complexity of Data Reviewed Labs: ordered. Radiology: ordered.   Patient's symptoms seem to be consistent with vertigo patient's had in the past.  Patient seen in June for vertigo.  CT head without any acute abnormalities.  Blood sugars when he first arrived were 98 has not anything to eat or drink all day now down to 66.  Patient does have a history of diabetes we will feed him and recheck.  CBC white count 3.9 hemoglobin 14.7 differential without any abnormalities.  Liver function test normal GFR 57 patient known to have some chronic kidney disease.  CT head no acute intracranial findings.  Patient would probably benefit from having prescription of Antivert at home.  Here will give food make sure blood sugar comes up.  Patient's blood pressure was elevated earlier but showing some signs of improvement currently 147/71.  His EKG shows paced rhythm.  Patient known to have a pacemaker.  He is to be working well based on twelve-lead and cardiac monitor.  Patient without complaint of any dizziness currently.  Patient's neuro exam without any focal deficits other than the fact of his poor eyesight which is baseline.  His blood sugar after having some foods up to 145 which is reassuring.  Blood pressure still marginally elevated.  Patient is on blood pressure medicines can follow-up with primary care doctor to make sure adequate control.  We will give patient treatment with Antivert for the dizziness and have him also follow-up with neurology.  Patient's had the symptoms  before so not concerned about it being acute stroke in nature.  Plus patient currently has no dizziness or vertigo at all.  Final Clinical Impression(s) / ED Diagnoses Final diagnoses:  Vertigo  Hypoglycemia  Essential hypertension    Rx / DC Orders ED Discharge Orders  None         Fredia Sorrow, MD 12/01/21 1631    Fredia Sorrow, MD 12/01/21 1840

## 2022-01-18 ENCOUNTER — Ambulatory Visit (INDEPENDENT_AMBULATORY_CARE_PROVIDER_SITE_OTHER): Payer: Medicare Other

## 2022-01-18 DIAGNOSIS — I442 Atrioventricular block, complete: Secondary | ICD-10-CM

## 2022-01-18 LAB — CUP PACEART REMOTE DEVICE CHECK
Battery Remaining Longevity: 97 mo
Battery Remaining Percentage: 76 %
Battery Voltage: 3.01 V
Brady Statistic RV Percent Paced: 69 %
Date Time Interrogation Session: 20231117020011
Implantable Lead Connection Status: 753985
Implantable Lead Connection Status: 753985
Implantable Lead Implant Date: 20201116
Implantable Lead Implant Date: 20201116
Implantable Lead Location: 753859
Implantable Lead Location: 753860
Implantable Pulse Generator Implant Date: 20201116
Lead Channel Impedance Value: 680 Ohm
Lead Channel Pacing Threshold Amplitude: 0.5 V
Lead Channel Pacing Threshold Pulse Width: 0.5 ms
Lead Channel Sensing Intrinsic Amplitude: 12 mV
Lead Channel Setting Pacing Amplitude: 2.5 V
Lead Channel Setting Pacing Pulse Width: 0.5 ms
Lead Channel Setting Sensing Sensitivity: 0.7 mV
Pulse Gen Model: 2272
Pulse Gen Serial Number: 9171567

## 2022-02-05 NOTE — Progress Notes (Signed)
Remote pacemaker transmission.   

## 2022-04-19 ENCOUNTER — Ambulatory Visit (INDEPENDENT_AMBULATORY_CARE_PROVIDER_SITE_OTHER): Payer: Medicare Other

## 2022-04-19 DIAGNOSIS — I442 Atrioventricular block, complete: Secondary | ICD-10-CM

## 2022-04-19 LAB — CUP PACEART REMOTE DEVICE CHECK
Battery Remaining Longevity: 94 mo
Battery Remaining Percentage: 74 %
Battery Voltage: 3.01 V
Brady Statistic RV Percent Paced: 72 %
Date Time Interrogation Session: 20240216020014
Implantable Lead Connection Status: 753985
Implantable Lead Connection Status: 753985
Implantable Lead Implant Date: 20201116
Implantable Lead Implant Date: 20201116
Implantable Lead Location: 753859
Implantable Lead Location: 753860
Implantable Pulse Generator Implant Date: 20201116
Lead Channel Impedance Value: 690 Ohm
Lead Channel Pacing Threshold Amplitude: 0.5 V
Lead Channel Pacing Threshold Pulse Width: 0.5 ms
Lead Channel Sensing Intrinsic Amplitude: 12 mV
Lead Channel Setting Pacing Amplitude: 2.5 V
Lead Channel Setting Pacing Pulse Width: 0.5 ms
Lead Channel Setting Sensing Sensitivity: 0.7 mV
Pulse Gen Model: 2272
Pulse Gen Serial Number: 9171567

## 2022-05-14 NOTE — Progress Notes (Signed)
Remote pacemaker transmission.   

## 2022-05-17 NOTE — Telephone Encounter (Signed)
error 

## 2022-06-20 ENCOUNTER — Encounter: Payer: Self-pay | Admitting: Internal Medicine

## 2022-07-19 ENCOUNTER — Ambulatory Visit (INDEPENDENT_AMBULATORY_CARE_PROVIDER_SITE_OTHER): Payer: Medicare Other

## 2022-07-19 DIAGNOSIS — I442 Atrioventricular block, complete: Secondary | ICD-10-CM

## 2022-07-19 LAB — CUP PACEART REMOTE DEVICE CHECK
Battery Remaining Longevity: 92 mo
Battery Remaining Percentage: 72 %
Battery Voltage: 3.01 V
Brady Statistic RV Percent Paced: 77 %
Date Time Interrogation Session: 20240517020014
Implantable Lead Connection Status: 753985
Implantable Lead Connection Status: 753985
Implantable Lead Implant Date: 20201116
Implantable Lead Implant Date: 20201116
Implantable Lead Location: 753859
Implantable Lead Location: 753860
Implantable Pulse Generator Implant Date: 20201116
Lead Channel Impedance Value: 680 Ohm
Lead Channel Pacing Threshold Amplitude: 0.5 V
Lead Channel Pacing Threshold Pulse Width: 0.5 ms
Lead Channel Sensing Intrinsic Amplitude: 4.8 mV
Lead Channel Setting Pacing Amplitude: 2.5 V
Lead Channel Setting Pacing Pulse Width: 0.5 ms
Lead Channel Setting Sensing Sensitivity: 0.7 mV
Pulse Gen Model: 2272
Pulse Gen Serial Number: 9171567

## 2022-07-31 NOTE — Progress Notes (Signed)
Remote pacemaker transmission.   

## 2022-08-26 ENCOUNTER — Ambulatory Visit: Payer: Medicare Other | Attending: Internal Medicine | Admitting: Internal Medicine

## 2022-08-26 ENCOUNTER — Encounter: Payer: Self-pay | Admitting: Internal Medicine

## 2022-08-26 VITALS — BP 140/82 | HR 60 | Ht 74.0 in | Wt 207.4 lb

## 2022-08-26 DIAGNOSIS — I5032 Chronic diastolic (congestive) heart failure: Secondary | ICD-10-CM

## 2022-08-26 NOTE — Progress Notes (Signed)
HPI [name] returns today for followup. He is a pleasant 89 yo man with CHB, s/p PPM, HTN, diastolic heart failure, typical atrial flutter and HTN. He has done well in the interim. Despite his advanced age, he tries to stay active. He has class 2 symptoms. He has not had syncope. No angina. He drinks a single ETOH beverage daily. No syncope.  Allergies  Allergen Reactions   Citalopram     Headache   Sertraline Diarrhea    Headache, Indigestion     Current Outpatient Medications  Medication Sig Dispense Refill   acetaminophen (TYLENOL) 500 MG tablet Take 1,000 mg by mouth every 6 (six) hours as needed (pain).     albuterol (VENTOLIN HFA) 108 (90 Base) MCG/ACT inhaler Inhale 2 puffs into the lungs every 6 (six) hours as needed for wheezing or shortness of breath. 8 g 2   allopurinol (ZYLOPRIM) 100 MG tablet Take 100 mg by mouth daily.      amLODipine (NORVASC) 5 MG tablet Take 1 tablet (5 mg total) by mouth daily. 90 tablet 3   atorvastatin (LIPITOR) 40 MG tablet Take 40 mg by mouth at bedtime.      bismuth subsalicylate (PEPTO BISMOL) 262 MG/15ML suspension Take 30 mLs by mouth every 6 (six) hours as needed for indigestion or diarrhea or loose stools.     brimonidine (ALPHAGAN) 0.2 % ophthalmic solution Place 1 drop into both eyes in the morning, at noon, and at bedtime.     dorzolamide-timolol (COSOPT) 22.3-6.8 MG/ML ophthalmic solution Place 1 drop into both eyes Twice daily.      famotidine (PEPCID) 40 MG tablet Take 40 mg by mouth daily.     fluticasone-salmeterol (ADVAIR) 500-50 MCG/ACT AEPB Inhale 1 puff into the lungs in the morning and at bedtime.     furosemide (LASIX) 20 MG tablet Take 2 tablets (40 mg total) by mouth daily. (Patient taking differently: Take 20 mg by mouth 2 (two) times daily.) 60 tablet 11   meclizine (ANTIVERT) 25 MG tablet Take 1 tablet (25 mg total) by mouth 3 (three) times daily as needed for dizziness. 30 tablet 0   metoprolol tartrate (LOPRESSOR)  50 MG tablet Take 25 mg by mouth daily.     polyvinyl alcohol (LIQUIFILM TEARS) 1.4 % ophthalmic solution Place 1 drop into both eyes 4 (four) times daily as needed for dry eyes.     prednisoLONE acetate (PRED FORTE) 1 % ophthalmic suspension Place 1 drop into both eyes daily.      tamsulosin (FLOMAX) 0.4 MG CAPS capsule Take 0.4 mg by mouth at bedtime.     apixaban (ELIQUIS) 5 MG TABS tablet Take 1 tablet (5 mg total) by mouth 2 (two) times daily. Start 01/24/19 evening dose 60 tablet 3   No current facility-administered medications for this visit.     Past Medical History:  Diagnosis Date   Arthritis    Blindness    due to glaucoma and cataract   Cancer (HCC) 1998   Prostate. followed by urology   CHF (congestive heart failure) (HCC)    CKD (chronic kidney disease), stage II    Coronary artery disease    Had stent placed 2015???   Dementia (HCC)    Diabetes mellitus (HCC)    GERD (gastroesophageal reflux disease)    Hypercholesteremia    Hypertension    Shortness of breath dyspnea    Stroke (HCC) 2016   a. on ASA/Plavix - felt 2/2 small  vessel disease source per neuro notes.   Vertigo     ROS:   All systems reviewed and negative except as noted in the HPI.   Past Surgical History:  Procedure Laterality Date   CATARACT EXTRACTION  2011 and 2012   2011lt eye and rt eye 2012   CORONARY ANGIOPLASTY WITH STENT PLACEMENT  2015???   glauma implant  2012   rt.eye   PACEMAKER IMPLANT N/A 01/18/2019   Procedure: PACEMAKER IMPLANT;  Surgeon: Marinus Maw, MD;  Location: [facility] INVASIVE [facility] LAB;  Service: Cardiovascular;  Laterality: N/A;   PROSTATECTOMY  1998     Family History  Problem Relation Age of Onset   Premature CHD Neg Hx      Social History   Socioeconomic History   Marital status: Widowed    Spouse name: Not on file   Number of children: Not on file   Years of education: Not on file   Highest education level: Not on file  Occupational History   Not on  file  Tobacco Use   Smoking status: Never   Smokeless tobacco: Never  Substance and Sexual Activity   Alcohol use: Yes    Alcohol/week: 2.0 standard drinks of alcohol    Types: 2 Shots of liquor per week    Comment: rare   Drug use: No   Sexual activity: Not Currently  Other Topics Concern   Not on file  Social History Narrative   Retired Cytogeneticist   Lives with daughter   Social Determinants of Health   Financial Resource Strain: Not on file  Food Insecurity: Not on file  Transportation Needs: Not on file  Physical Activity: Not on file  Stress: Not on file  Social Connections: Not on file  Intimate Partner Violence: Not on file     BP (!) 140/82   Pulse 60   Ht 6\' 2"  (1.88 m)   Wt 207 lb 6.4 oz (94.1 kg)   SpO2 97%   BMI 26.63 kg/m   Physical Exam:  Well appearing NAD HEENT: Unremarkable Neck:  No JVD, no thyromegally Lymphatics:  No adenopathy Back:  No CVA tenderness Lungs:  Clear HEART:  Regular rate rhythm, no murmurs, no rubs, no clicks Abd:  soft, positive bowel sounds, no organomegally, no rebound, no guarding Ext:  2 plus pulses, no edema, no cyanosis, no clubbing Skin:  No rashes no nodules Neuro:  CN II through XII intact, motor grossly intact  EKG  DEVICE  Normal device function.  See PaceArt for details.   Assess/Plan:   Atrial flutter - he has spontaneously reverted back to NSR.  2. PPM - his [employer] Jude PPM is mostly working normally. He had some atrial undersensing but as he has reverted back to NSR he was reprogrammed back to DDDR.  3. Diastolic heart failure -he has class 2 symptoms. I asked him to avoid salty foods. 4. HTN - his bp is well controlled today. No change in meds. He admits to sodium indiscretion and I asked him to work on this.   Sharlot Gowda Veniamin Kincaid,MD

## 2022-08-26 NOTE — Patient Instructions (Signed)
Medication Instructions:  Your physician recommends that you continue on your current medications as directed. Please refer to the Current Medication list given to you today. *If you need a refill on your cardiac medications before your next appointment, please call your pharmacy   Follow-Up: At [facility], you and your health needs are our priority.  As part of our continuing mission to provide you with exceptional heart care, we have created designated Provider Care Teams.  These Care Teams include your primary Cardiologist (physician) and Advanced Practice Providers (APPs -  Physician Assistants and Nurse Practitioners) who all work together to provide you with the care you need, when you need it.  We recommend signing up for the patient portal called "MyChart".  Sign up information is provided on this After Visit Summary.  MyChart is used to connect with patients for Virtual Visits (Telemedicine).  Patients are able to view lab/test results, encounter notes, upcoming appointments, etc.  Non-urgent messages can be sent to your provider as well.   To learn more about what you can do with MyChart, go to [email or web address].    Your next appointment:   1 year(s)  Provider:   Gregg Taylor, MD   

## 2022-10-18 ENCOUNTER — Ambulatory Visit (INDEPENDENT_AMBULATORY_CARE_PROVIDER_SITE_OTHER): Payer: Medicare Other

## 2022-10-18 DIAGNOSIS — I442 Atrioventricular block, complete: Secondary | ICD-10-CM

## 2022-10-18 LAB — CUP PACEART REMOTE DEVICE CHECK
Battery Remaining Longevity: 73 mo
Battery Remaining Percentage: 70 %
Battery Voltage: 2.99 V
Brady Statistic AP VP Percent: 91 %
Brady Statistic AP VS Percent: 1 %
Brady Statistic AS VP Percent: 3.4 %
Brady Statistic AS VS Percent: 4.5 %
Brady Statistic RA Percent Paced: 90 %
Brady Statistic RV Percent Paced: 94 %
Date Time Interrogation Session: 20240816035126
Implantable Lead Connection Status: 753985
Implantable Lead Connection Status: 753985
Implantable Lead Implant Date: 20201116
Implantable Lead Implant Date: 20201116
Implantable Lead Location: 753859
Implantable Lead Location: 753860
Implantable Pulse Generator Implant Date: 20201116
Lead Channel Impedance Value: 450 Ohm
Lead Channel Impedance Value: 650 Ohm
Lead Channel Pacing Threshold Amplitude: 0.5 V
Lead Channel Pacing Threshold Amplitude: 1 V
Lead Channel Pacing Threshold Pulse Width: 0.5 ms
Lead Channel Pacing Threshold Pulse Width: 0.5 ms
Lead Channel Sensing Intrinsic Amplitude: 1 mV
Lead Channel Sensing Intrinsic Amplitude: 4.6 mV
Lead Channel Setting Pacing Amplitude: 2 V
Lead Channel Setting Pacing Amplitude: 2.5 V
Lead Channel Setting Pacing Pulse Width: 0.5 ms
Lead Channel Setting Sensing Sensitivity: 0.7 mV
Pulse Gen Model: 2272
Pulse Gen Serial Number: 9171567

## 2022-10-28 NOTE — Progress Notes (Signed)
Remote pacemaker transmission.   

## 2023-01-17 ENCOUNTER — Ambulatory Visit (INDEPENDENT_AMBULATORY_CARE_PROVIDER_SITE_OTHER): Payer: Medicare Other

## 2023-01-17 DIAGNOSIS — I442 Atrioventricular block, complete: Secondary | ICD-10-CM | POA: Diagnosis not present

## 2023-01-18 LAB — CUP PACEART REMOTE DEVICE CHECK
Battery Remaining Longevity: 70 mo
Battery Remaining Percentage: 67 %
Battery Voltage: 2.99 V
Brady Statistic AP VP Percent: 90 %
Brady Statistic AP VS Percent: 1 %
Brady Statistic AS VP Percent: 3.4 %
Brady Statistic AS VS Percent: 5.3 %
Brady Statistic RA Percent Paced: 90 %
Brady Statistic RV Percent Paced: 93 %
Date Time Interrogation Session: 20241115020013
Implantable Lead Connection Status: 753985
Implantable Lead Connection Status: 753985
Implantable Lead Implant Date: 20201116
Implantable Lead Implant Date: 20201116
Implantable Lead Location: 753859
Implantable Lead Location: 753860
Implantable Pulse Generator Implant Date: 20201116
Lead Channel Impedance Value: 440 Ohm
Lead Channel Impedance Value: 650 Ohm
Lead Channel Pacing Threshold Amplitude: 0.5 V
Lead Channel Pacing Threshold Amplitude: 1 V
Lead Channel Pacing Threshold Pulse Width: 0.5 ms
Lead Channel Pacing Threshold Pulse Width: 0.5 ms
Lead Channel Sensing Intrinsic Amplitude: 0.8 mV
Lead Channel Sensing Intrinsic Amplitude: 3.6 mV
Lead Channel Setting Pacing Amplitude: 2 V
Lead Channel Setting Pacing Amplitude: 2.5 V
Lead Channel Setting Pacing Pulse Width: 0.5 ms
Lead Channel Setting Sensing Sensitivity: 0.7 mV
Pulse Gen Model: 2272
Pulse Gen Serial Number: 9171567

## 2023-01-29 NOTE — Progress Notes (Signed)
Remote pacemaker transmission.   

## 2023-04-18 ENCOUNTER — Ambulatory Visit (INDEPENDENT_AMBULATORY_CARE_PROVIDER_SITE_OTHER): Payer: Medicare Other

## 2023-04-18 DIAGNOSIS — I442 Atrioventricular block, complete: Secondary | ICD-10-CM

## 2023-04-19 LAB — CUP PACEART REMOTE DEVICE CHECK
Battery Remaining Longevity: 67 mo
Battery Remaining Percentage: 65 %
Battery Voltage: 2.99 V
Brady Statistic AP VP Percent: 90 %
Brady Statistic AP VS Percent: 1 %
Brady Statistic AS VP Percent: 3.5 %
Brady Statistic AS VS Percent: 5.3 %
Brady Statistic RA Percent Paced: 90 %
Brady Statistic RV Percent Paced: 93 %
Date Time Interrogation Session: 20250214020013
Implantable Lead Connection Status: 753985
Implantable Lead Connection Status: 753985
Implantable Lead Implant Date: 20201116
Implantable Lead Implant Date: 20201116
Implantable Lead Location: 753859
Implantable Lead Location: 753860
Implantable Pulse Generator Implant Date: 20201116
Lead Channel Impedance Value: 450 Ohm
Lead Channel Impedance Value: 640 Ohm
Lead Channel Pacing Threshold Amplitude: 0.5 V
Lead Channel Pacing Threshold Amplitude: 1 V
Lead Channel Pacing Threshold Pulse Width: 0.5 ms
Lead Channel Pacing Threshold Pulse Width: 0.5 ms
Lead Channel Sensing Intrinsic Amplitude: 1.3 mV
Lead Channel Sensing Intrinsic Amplitude: 3.9 mV
Lead Channel Setting Pacing Amplitude: 2 V
Lead Channel Setting Pacing Amplitude: 2.5 V
Lead Channel Setting Pacing Pulse Width: 0.5 ms
Lead Channel Setting Sensing Sensitivity: 0.7 mV
Pulse Gen Model: 2272
Pulse Gen Serial Number: 9171567

## 2023-04-20 ENCOUNTER — Encounter: Payer: Self-pay | Admitting: Internal Medicine

## 2023-05-19 NOTE — Progress Notes (Signed)
 Remote pacemaker transmission.

## 2023-05-19 NOTE — Addendum Note (Signed)
 Addended by: Elease Etienne A on: 05/19/2023 04:03 PM   Modules accepted: Orders

## 2023-07-18 ENCOUNTER — Ambulatory Visit (INDEPENDENT_AMBULATORY_CARE_PROVIDER_SITE_OTHER): Payer: Medicare Other

## 2023-07-18 DIAGNOSIS — I442 Atrioventricular block, complete: Secondary | ICD-10-CM | POA: Diagnosis not present

## 2023-07-18 LAB — CUP PACEART REMOTE DEVICE CHECK
Battery Remaining Longevity: 65 mo
Battery Remaining Percentage: 62 %
Battery Voltage: 2.99 V
Brady Statistic AP VP Percent: 89 %
Brady Statistic AP VS Percent: 1 %
Brady Statistic AS VP Percent: 4 %
Brady Statistic AS VS Percent: 5.3 %
Brady Statistic RA Percent Paced: 89 %
Brady Statistic RV Percent Paced: 93 %
Date Time Interrogation Session: 20250516020021
Implantable Lead Connection Status: 753985
Implantable Lead Connection Status: 753985
Implantable Lead Implant Date: 20201116
Implantable Lead Implant Date: 20201116
Implantable Lead Location: 753859
Implantable Lead Location: 753860
Implantable Pulse Generator Implant Date: 20201116
Lead Channel Impedance Value: 450 Ohm
Lead Channel Impedance Value: 690 Ohm
Lead Channel Pacing Threshold Amplitude: 0.5 V
Lead Channel Pacing Threshold Amplitude: 1 V
Lead Channel Pacing Threshold Pulse Width: 0.5 ms
Lead Channel Pacing Threshold Pulse Width: 0.5 ms
Lead Channel Sensing Intrinsic Amplitude: 1.6 mV
Lead Channel Sensing Intrinsic Amplitude: 4.9 mV
Lead Channel Setting Pacing Amplitude: 2 V
Lead Channel Setting Pacing Amplitude: 2.5 V
Lead Channel Setting Pacing Pulse Width: 0.5 ms
Lead Channel Setting Sensing Sensitivity: 0.7 mV
Pulse Gen Model: 2272
Pulse Gen Serial Number: 9171567

## 2023-07-21 ENCOUNTER — Ambulatory Visit: Payer: Self-pay | Admitting: Internal Medicine

## 2023-08-25 NOTE — Progress Notes (Signed)
 Remote pacemaker transmission.

## 2023-10-17 ENCOUNTER — Ambulatory Visit (INDEPENDENT_AMBULATORY_CARE_PROVIDER_SITE_OTHER): Payer: Medicare Other

## 2023-10-17 DIAGNOSIS — I442 Atrioventricular block, complete: Secondary | ICD-10-CM

## 2023-10-20 LAB — CUP PACEART REMOTE DEVICE CHECK
Battery Remaining Longevity: 64 mo
Battery Remaining Percentage: 60 %
Battery Voltage: 2.99 V
Brady Statistic AP VP Percent: 87 %
Brady Statistic AP VS Percent: 1 %
Brady Statistic AS VP Percent: 5.3 %
Brady Statistic AS VS Percent: 5.5 %
Brady Statistic RA Percent Paced: 86 %
Brady Statistic RV Percent Paced: 92 %
Date Time Interrogation Session: 20250815020024
Implantable Lead Connection Status: 753985
Implantable Lead Connection Status: 753985
Implantable Lead Implant Date: 20201116
Implantable Lead Implant Date: 20201116
Implantable Lead Location: 753859
Implantable Lead Location: 753860
Implantable Pulse Generator Implant Date: 20201116
Lead Channel Impedance Value: 490 Ohm
Lead Channel Impedance Value: 710 Ohm
Lead Channel Pacing Threshold Amplitude: 0.5 V
Lead Channel Pacing Threshold Amplitude: 1 V
Lead Channel Pacing Threshold Pulse Width: 0.5 ms
Lead Channel Pacing Threshold Pulse Width: 0.5 ms
Lead Channel Sensing Intrinsic Amplitude: 1.3 mV
Lead Channel Sensing Intrinsic Amplitude: 4.8 mV
Lead Channel Setting Pacing Amplitude: 2 V
Lead Channel Setting Pacing Amplitude: 2.5 V
Lead Channel Setting Pacing Pulse Width: 0.5 ms
Lead Channel Setting Sensing Sensitivity: 0.7 mV
Pulse Gen Model: 2272
Pulse Gen Serial Number: 9171567

## 2023-10-21 ENCOUNTER — Ambulatory Visit: Payer: Self-pay | Admitting: Internal Medicine

## 2023-11-26 NOTE — Progress Notes (Signed)
 Remote PPM Transmission

## 2024-01-16 ENCOUNTER — Ambulatory Visit: Payer: Medicare Other

## 2024-01-16 DIAGNOSIS — I442 Atrioventricular block, complete: Secondary | ICD-10-CM

## 2024-01-18 ENCOUNTER — Ambulatory Visit: Payer: Self-pay | Admitting: Internal Medicine

## 2024-01-18 LAB — CUP PACEART REMOTE DEVICE CHECK
Battery Remaining Longevity: 60 mo
Battery Remaining Percentage: 57 %
Battery Voltage: 2.99 V
Brady Statistic AP VP Percent: 85 %
Brady Statistic AP VS Percent: 1 %
Brady Statistic AS VP Percent: 6.1 %
Brady Statistic AS VS Percent: 6 %
Brady Statistic RA Percent Paced: 81 %
Brady Statistic RV Percent Paced: 90 %
Date Time Interrogation Session: 20251114020015
Implantable Lead Connection Status: 753985
Implantable Lead Connection Status: 753985
Implantable Lead Implant Date: 20201116
Implantable Lead Implant Date: 20201116
Implantable Lead Location: 753859
Implantable Lead Location: 753860
Implantable Pulse Generator Implant Date: 20201116
Lead Channel Impedance Value: 460 Ohm
Lead Channel Impedance Value: 660 Ohm
Lead Channel Pacing Threshold Amplitude: 0.5 V
Lead Channel Pacing Threshold Amplitude: 1 V
Lead Channel Pacing Threshold Pulse Width: 0.5 ms
Lead Channel Pacing Threshold Pulse Width: 0.5 ms
Lead Channel Sensing Intrinsic Amplitude: 0.6 mV
Lead Channel Sensing Intrinsic Amplitude: 10.8 mV
Lead Channel Setting Pacing Amplitude: 2 V
Lead Channel Setting Pacing Amplitude: 2.5 V
Lead Channel Setting Pacing Pulse Width: 0.5 ms
Lead Channel Setting Sensing Sensitivity: 0.7 mV
Pulse Gen Model: 2272
Pulse Gen Serial Number: 9171567

## 2024-01-19 ENCOUNTER — Telehealth: Payer: Self-pay

## 2024-01-19 NOTE — Telephone Encounter (Signed)
 Has recall in and needs apt.

## 2024-01-20 NOTE — Progress Notes (Signed)
 Remote PPM Transmission

## 2024-03-02 NOTE — Telephone Encounter (Signed)
 Spoke w/ patients daughter Romona - patient is scheduled w/ EP APP on 1/19.

## 2024-03-21 NOTE — Progress Notes (Unsigned)
" °  Electrophysiology Office Note:   Date:  03/22/2024  ID:  Trevor LELON Rosalynn Mickey., DOB 1929-09-05, MRN [id number]  Primary Cardiologist: None Primary Heart Failure: None Electrophysiologist: Donnice DELENA Primus, MD       History of Present Illness:   Trevor Andrews. is a 89 y.o. male with h/o CHB s/p PPM, typical atrial flutter, HTN, HFpEF,  seen today for routine electrophysiology followup.   Since last being seen in our clinic the patient reports doing well overall. He does note dizziness with position changes in the am from lying to sitting. He has not fallen but his daughter worries about his stability. He walks with a cane.   He denies chest pain, palpitations, dyspnea, PND, orthopnea, nausea, vomiting, dizziness, syncope, edema, weight gain, or early satiety.   Review of systems complete and found to be negative unless listed in HPI.    EP Information / Studies Reviewed:    EKG is ordered today. Personal review as below.  EKG Interpretation Date/Time:  Monday March 22 2024 14:45:40 EST Ventricular Rate:  62 PR Interval:    QRS Duration:  162 QT Interval:  456 QTC Calculation: 462 R Axis:   -16  Text Interpretation: Ventricular-paced rhythm Confirmed by Aniceto Jarvis ([id number]) on 03/22/2024 3:09:07 PM   PPM Interrogation-  reviewed in detail today,  See PACEART report.  Arrhythmia/Device History: Abbott Dual Chamber PPM implanted 01/18/19 for CHB  Physical Exam:   VS:  BP (!) 144/82   Pulse 62   Ht 6' 2 (1.88 m)   Wt 211 lb 9.6 oz (96 kg)   SpO2 96%   BMI 27.17 kg/m    Wt Readings from Last 3 Encounters:  03/22/24 211 lb 9.6 oz (96 kg)  08/26/22 207 lb 6.4 oz (94.1 kg)  08/22/21 209 lb (94.8 kg)     GEN: Well nourished, well developed in no acute distress NECK: No JVD; No carotid bruits CARDIAC: Regular rate and rhythm, no murmurs, rubs, gallops, device site wnl / no tethering  RESPIRATORY:  Clear to auscultation without rales, wheezing or rhonchi  ABDOMEN: Soft,  non-tender, non-distended EXTREMITIES:  No edema; No deformity   ASSESSMENT AND PLAN:    CHB s/p Abbott PPM  -Normal PPM function -See Pace Art report -No changes today  Typical Atrial Flutter -30% burden on device (since 09/2022)  -continue lopressor  25 mg daily   Secondary Hypercoagulable State  -continue Eliquis  5mg  BID, dose reviewed and appropriate by wt / Cr   Diastolic Heart Failure  -euvolemic on exam   Hypertension  -well controlled on current regimen     Disposition:   Follow up with Dr. Primus in 12 months > transition to new EP MD reviewed  Signed, Jarvis Aniceto, NP-C, AGACNP-BC [facility] - Electrophysiology  03/22/2024, 3:34 PM  "

## 2024-03-22 ENCOUNTER — Ambulatory Visit: Attending: Pulmonary Disease | Admitting: Pulmonary Disease

## 2024-03-22 ENCOUNTER — Encounter: Payer: Self-pay | Admitting: Pulmonary Disease

## 2024-03-22 VITALS — BP 144/82 | HR 62 | Ht 74.0 in | Wt 211.6 lb

## 2024-03-22 DIAGNOSIS — D6869 Other thrombophilia: Secondary | ICD-10-CM

## 2024-03-22 DIAGNOSIS — I442 Atrioventricular block, complete: Secondary | ICD-10-CM

## 2024-03-22 DIAGNOSIS — Z950 Presence of cardiac pacemaker: Secondary | ICD-10-CM | POA: Diagnosis not present

## 2024-03-22 DIAGNOSIS — I5032 Chronic diastolic (congestive) heart failure: Secondary | ICD-10-CM | POA: Diagnosis not present

## 2024-03-22 LAB — CUP PACEART INCLINIC DEVICE CHECK
Battery Remaining Longevity: 55 mo
Battery Voltage: 2.99 V
Brady Statistic RA Percent Paced: 77 %
Brady Statistic RV Percent Paced: 90 %
Date Time Interrogation Session: 20260119153743
Implantable Lead Connection Status: 753985
Implantable Lead Connection Status: 753985
Implantable Lead Implant Date: 20201116
Implantable Lead Implant Date: 20201116
Implantable Lead Location: 753859
Implantable Lead Location: 753860
Implantable Pulse Generator Implant Date: 20201116
Lead Channel Impedance Value: 487.5 Ohm
Lead Channel Impedance Value: 750 Ohm
Lead Channel Pacing Threshold Amplitude: 0.5 V
Lead Channel Pacing Threshold Amplitude: 0.5 V
Lead Channel Pacing Threshold Amplitude: 1.25 V
Lead Channel Pacing Threshold Amplitude: 1.25 V
Lead Channel Pacing Threshold Pulse Width: 0.5 ms
Lead Channel Pacing Threshold Pulse Width: 0.5 ms
Lead Channel Pacing Threshold Pulse Width: 0.5 ms
Lead Channel Pacing Threshold Pulse Width: 0.5 ms
Lead Channel Sensing Intrinsic Amplitude: 1.7 mV
Lead Channel Setting Pacing Amplitude: 2.5 V
Lead Channel Setting Pacing Amplitude: 2.5 V
Lead Channel Setting Pacing Pulse Width: 0.5 ms
Lead Channel Setting Sensing Sensitivity: 0.7 mV
Pulse Gen Model: 2272
Pulse Gen Serial Number: 9171567

## 2024-03-22 NOTE — Patient Instructions (Signed)
 Medication Instructions:  No medications changes today > ok to take Metoprolol  at night time   *If you need a refill on your cardiac medications before your next appointment, please call your pharmacy*  Lab Work: No lab work today If you have labs (blood work) drawn today and your tests are completely normal, you will receive your results only by: MyChart Message (if you have MyChart) OR A paper copy in the mail If you have any lab test that is abnormal or we need to change your treatment, we will call you to review the results.  Testing/Procedures: No testing/procedures were scheduled today  Follow-Up: At [facility], you and your health needs are our priority.  As part of our continuing mission to provide you with exceptional heart care, our providers are all part of one team.  This team includes your primary Cardiologist (physician) and Advanced Practice Providers or APPs (Physician Assistants and Nurse Practitioners) who all work together to provide you with the care you need, when you need it.  Your next appointment:   1 year(s)  Provider:   You may see Donnice DELENA Primus, MD or one of the following Advanced Practice Providers on your designated Care Team:    Daphne Barrack, NP    We recommend signing up for the patient portal called MyChart.  Sign up information is provided on this After Visit Summary.  MyChart is used to connect with patients for Virtual Visits (Telemedicine).  Patients are able to view lab/test results, encounter notes, upcoming appointments, etc.  Non-urgent messages can be sent to your provider as well.   To learn more about what you can do with MyChart, go to [email or web address].

## 2024-04-04 ENCOUNTER — Ambulatory Visit: Payer: Self-pay | Admitting: Student in an Organized Health Care Education/Training Program

## 2024-04-16 ENCOUNTER — Ambulatory Visit

## 2024-07-16 ENCOUNTER — Ambulatory Visit

## 2024-10-15 ENCOUNTER — Ambulatory Visit

## 2025-01-14 ENCOUNTER — Ambulatory Visit
